# Patient Record
Sex: Female | Born: 1965 | Race: White | Hispanic: No | Marital: Married | Smoking: Never smoker
Health system: Southern US, Community
[De-identification: ages and names within clinical notes are randomized; demographics above are authoritative.]

## PROBLEM LIST (undated history)

## (undated) DIAGNOSIS — M1990 Unspecified osteoarthritis, unspecified site: Secondary | ICD-10-CM

## (undated) DIAGNOSIS — K219 Gastro-esophageal reflux disease without esophagitis: Secondary | ICD-10-CM

## (undated) DIAGNOSIS — Z87442 Personal history of urinary calculi: Secondary | ICD-10-CM

## (undated) DIAGNOSIS — J189 Pneumonia, unspecified organism: Secondary | ICD-10-CM

## (undated) DIAGNOSIS — C801 Malignant (primary) neoplasm, unspecified: Secondary | ICD-10-CM

## (undated) DIAGNOSIS — E119 Type 2 diabetes mellitus without complications: Secondary | ICD-10-CM

## (undated) DIAGNOSIS — N809 Endometriosis, unspecified: Secondary | ICD-10-CM

## (undated) DIAGNOSIS — T884XXA Failed or difficult intubation, initial encounter: Secondary | ICD-10-CM

## (undated) DIAGNOSIS — G2581 Restless legs syndrome: Secondary | ICD-10-CM

## (undated) DIAGNOSIS — N189 Chronic kidney disease, unspecified: Secondary | ICD-10-CM

## (undated) HISTORY — PX: ABDOMINAL HYSTERECTOMY: SHX81

## (undated) HISTORY — PX: MULTIPLE TOOTH EXTRACTIONS: SHX2053

## (undated) HISTORY — PX: LAPAROSCOPY WITH TUBAL LIGATION: SHX5576

## (undated) HISTORY — PX: COLONOSCOPY: SHX174

## (undated) HISTORY — PX: LAPAROSCOPIC TOTAL HYSTERECTOMY: SUR800

## (undated) HISTORY — PX: TONSILLECTOMY: SUR1361

## (undated) HISTORY — PX: CHOLECYSTECTOMY: SHX55

---
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
## 1993-01-01 HISTORY — PX: LEEP: SHX91

## 2001-01-01 HISTORY — PX: CARPAL TUNNEL RELEASE: SHX101

## 2003-11-29 ENCOUNTER — Ambulatory Visit: Payer: Self-pay | Admitting: Unknown Physician Specialty

## 2003-12-03 ENCOUNTER — Ambulatory Visit: Payer: Self-pay | Admitting: Unknown Physician Specialty

## 2005-01-01 DIAGNOSIS — G2581 Restless legs syndrome: Secondary | ICD-10-CM

## 2005-01-01 HISTORY — DX: Restless legs syndrome: G25.81

## 2005-02-08 ENCOUNTER — Ambulatory Visit: Payer: Self-pay | Admitting: Unknown Physician Specialty

## 2005-10-17 ENCOUNTER — Ambulatory Visit: Payer: Self-pay | Admitting: Unknown Physician Specialty

## 2005-10-26 ENCOUNTER — Ambulatory Visit: Payer: Self-pay | Admitting: Unknown Physician Specialty

## 2005-12-06 ENCOUNTER — Ambulatory Visit: Payer: Self-pay | Admitting: Unknown Physician Specialty

## 2006-02-11 ENCOUNTER — Ambulatory Visit: Payer: Self-pay | Admitting: Unknown Physician Specialty

## 2007-02-18 ENCOUNTER — Ambulatory Visit: Payer: Self-pay | Admitting: Unknown Physician Specialty

## 2008-03-11 ENCOUNTER — Ambulatory Visit: Payer: Self-pay | Admitting: Unknown Physician Specialty

## 2008-12-06 ENCOUNTER — Ambulatory Visit: Payer: Self-pay | Admitting: Internal Medicine

## 2009-03-16 ENCOUNTER — Ambulatory Visit: Payer: Self-pay | Admitting: Unknown Physician Specialty

## 2009-08-23 ENCOUNTER — Ambulatory Visit: Payer: Self-pay | Admitting: Urology

## 2010-03-30 ENCOUNTER — Ambulatory Visit: Payer: Self-pay | Admitting: Unknown Physician Specialty

## 2011-01-24 ENCOUNTER — Ambulatory Visit: Payer: Self-pay | Admitting: Internal Medicine

## 2011-02-01 ENCOUNTER — Ambulatory Visit: Payer: Self-pay | Admitting: Urology

## 2011-02-06 ENCOUNTER — Ambulatory Visit: Payer: Self-pay | Admitting: Urology

## 2011-04-09 ENCOUNTER — Ambulatory Visit: Payer: Self-pay | Admitting: Obstetrics and Gynecology

## 2011-10-02 ENCOUNTER — Ambulatory Visit: Payer: Self-pay | Admitting: Urology

## 2012-04-21 ENCOUNTER — Ambulatory Visit: Payer: Self-pay | Admitting: Obstetrics and Gynecology

## 2012-04-22 ENCOUNTER — Ambulatory Visit: Payer: Self-pay | Admitting: Obstetrics and Gynecology

## 2012-05-18 ENCOUNTER — Emergency Department: Payer: Self-pay | Admitting: Emergency Medicine

## 2012-05-18 LAB — BASIC METABOLIC PANEL
Anion Gap: 8 (ref 7–16)
BUN: 14 mg/dL (ref 7–18)
Calcium, Total: 8.7 mg/dL (ref 8.5–10.1)
Chloride: 105 mmol/L (ref 98–107)
Co2: 25 mmol/L (ref 21–32)
Creatinine: 0.78 mg/dL (ref 0.60–1.30)
EGFR (African American): >60
EGFR (Non-African Amer.): >60
Glucose: 118 mg/dL — ABNORMAL HIGH (ref 65–99)
Osmolality: 277 (ref 275–301)
Sodium: 138 mmol/L (ref 136–145)

## 2012-05-18 LAB — URINALYSIS, COMPLETE
Glucose,UR: NEGATIVE mg/dL (ref 0–75)
Ketone: NEGATIVE
Ph: 5 (ref 4.5–8.0)
Protein: NEGATIVE
RBC,UR: 96 /HPF (ref 0–5)
Specific Gravity: 1.017 (ref 1.003–1.030)
Squamous Epithelial: 7

## 2012-05-18 LAB — CBC
HCT: 37.2 % (ref 35.0–47.0)
HGB: 12.9 g/dL (ref 12.0–16.0)
MCHC: 34.8 g/dL (ref 32.0–36.0)
MCV: 86 fL (ref 80–100)
RBC: 4.31 10*6/uL (ref 3.80–5.20)
RDW: 12.4 % (ref 11.5–14.5)

## 2012-05-21 ENCOUNTER — Ambulatory Visit: Payer: Self-pay | Admitting: Urology

## 2012-05-30 ENCOUNTER — Ambulatory Visit: Payer: Self-pay

## 2012-06-30 ENCOUNTER — Ambulatory Visit: Payer: Self-pay | Admitting: Urology

## 2012-07-07 ENCOUNTER — Ambulatory Visit: Payer: Self-pay | Admitting: Urology

## 2013-03-25 ENCOUNTER — Ambulatory Visit: Payer: Self-pay | Admitting: Urology

## 2013-06-04 ENCOUNTER — Ambulatory Visit: Payer: Self-pay | Admitting: Internal Medicine

## 2013-09-03 ENCOUNTER — Emergency Department: Payer: Self-pay | Admitting: Emergency Medicine

## 2013-09-03 LAB — CBC WITH DIFFERENTIAL/PLATELET
BASOS ABS: 0.1 10*3/uL (ref 0.0–0.1)
Basophil %: 1.2 %
EOS ABS: 0 10*3/uL (ref 0.0–0.7)
Eosinophil %: 0.2 %
HCT: 38 % (ref 35.0–47.0)
HGB: 12.6 g/dL (ref 12.0–16.0)
Lymphocyte #: 2.9 10*3/uL (ref 1.0–3.6)
Lymphocyte %: 29.5 %
MCH: 29.2 pg (ref 26.0–34.0)
MCHC: 33.2 g/dL (ref 32.0–36.0)
MCV: 88 fL (ref 80–100)
MONOS PCT: 7.3 %
Monocyte #: 0.7 x10 3/mm (ref 0.2–0.9)
NEUTROS ABS: 6.1 10*3/uL (ref 1.4–6.5)
Neutrophil %: 61.8 %
PLATELETS: 261 10*3/uL (ref 150–440)
RBC: 4.32 10*6/uL (ref 3.80–5.20)
RDW: 12.6 % (ref 11.5–14.5)
WBC: 9.9 10*3/uL (ref 3.6–11.0)

## 2013-09-03 LAB — COMPREHENSIVE METABOLIC PANEL
ALK PHOS: 92 U/L
ANION GAP: 8 (ref 7–16)
Albumin: 4.1 g/dL (ref 3.4–5.0)
BILIRUBIN TOTAL: 0.4 mg/dL (ref 0.2–1.0)
BUN: 13 mg/dL (ref 7–18)
CREATININE: 0.99 mg/dL (ref 0.60–1.30)
Calcium, Total: 9 mg/dL (ref 8.5–10.1)
Chloride: 103 mmol/L (ref 98–107)
Co2: 26 mmol/L (ref 21–32)
EGFR (Non-African Amer.): >60
Glucose: 97 mg/dL (ref 65–99)
Osmolality: 274 (ref 275–301)
POTASSIUM: 3.7 mmol/L (ref 3.5–5.1)
SGOT(AST): 25 U/L (ref 15–37)
SGPT (ALT): 35 U/L
Sodium: 137 mmol/L (ref 136–145)
TOTAL PROTEIN: 7.3 g/dL (ref 6.4–8.2)

## 2013-09-03 LAB — URINALYSIS, COMPLETE
BACTERIA: NONE SEEN
Bilirubin,UR: NEGATIVE
GLUCOSE, UR: NEGATIVE mg/dL (ref 0–75)
KETONE: NEGATIVE
Leukocyte Esterase: NEGATIVE
Nitrite: NEGATIVE
PROTEIN: NEGATIVE
Ph: 5 (ref 4.5–8.0)
RBC,UR: 27 /HPF (ref 0–5)
SPECIFIC GRAVITY: 1.026 (ref 1.003–1.030)
Squamous Epithelial: 4
WBC UR: NONE SEEN /HPF (ref 0–5)

## 2014-04-23 NOTE — Op Note (Signed)
PATIENT NAME:  Martha Whitney, Martha Whitney MR#:  [id number] DATE OF BIRTH:  Feb 15, 1965  PREOPERATIVE DIAGNOSIS: Right filling defect in the right kidney.   POSTOPERATIVE DIAGNOSIS: Right filling defect in the right kidney with calculus seen in the upper pole.   DESCRIPTION OF PROCEDURE: The patient was sterilely prepped and draped in the supine lithotomy position for ease of approach to the external genitalia. I placed a scope in the bladder. I placed 2 wires up right ureter into the renal pelvis. Then, with a digital flexible ureteroscope, after a Navigator has been placed, I go up the ureter into the kidney. Kidney reveals no tumors, masses or growths. There is some bleeding from an upper pole calyx, but I see no tumor behind the bleeding. It may be just due to irritation for the wires. The Glidewires are not in the way and I am able to view all of the calyces. Digital camera helps this.  No tumors, masses or growths seen. One calculus is seen. It is a very calculus on a papilla. It is knocked loose of the papilla with the flexible scope. It should pass easily. The washings were taken with barbotage utilizing 10 mL syringe of normal saline. Washings were sent to pathology. At the end of the procedure, the kidney had been emptied, The Navigator is withdrawn and the wire was withdrawn. Bladder was emptied and 30 mL of 0.5% Marcaine is placed in the bladder. B and O suppository is placed. The patient is send to recovery in satisfactory condition.    ____________________________ Janice Coffin. Elnoria Howard, DO rdh:gb D: 07/07/2012 15:58:59 ET T: 07/07/2012 23:58:53 ET JOB#: [id number]  cc: Janice Coffin. Elnoria Howard, DO, <Dictator> RICHARD D HART DO ELECTRONICALLY SIGNED 07/25/2012 16:35

## 2014-05-12 ENCOUNTER — Emergency Department
Admission: EM | Admit: 2014-05-12 | Discharge: 2014-05-12 | Disposition: A | Discharge status: Home / Self Care | Payer: BC Managed Care – PPO | Attending: Emergency Medicine | Admitting: Emergency Medicine

## 2014-05-12 ENCOUNTER — Emergency Department: Payer: BC Managed Care – PPO

## 2014-05-12 DIAGNOSIS — N23 Unspecified renal colic: Secondary | ICD-10-CM | POA: Insufficient documentation

## 2014-05-12 DIAGNOSIS — R109 Unspecified abdominal pain: Secondary | ICD-10-CM | POA: Diagnosis present

## 2014-05-12 LAB — CBC WITH DIFFERENTIAL/PLATELET
Basophils Absolute: 0 10*3/uL (ref 0–0.1)
Basophils Relative: 0 %
EOS ABS: 0 10*3/uL (ref 0–0.7)
EOS PCT: 0 %
HCT: 41.5 % (ref 35.0–47.0)
Hemoglobin: 13.9 g/dL (ref 12.0–16.0)
Lymphocytes Relative: 26 %
Lymphs Abs: 2.6 10*3/uL (ref 1.0–3.6)
MCH: 29.5 pg (ref 26.0–34.0)
MCHC: 33.6 g/dL (ref 32.0–36.0)
MCV: 87.6 fL (ref 80.0–100.0)
MONOS PCT: 6 %
Monocytes Absolute: 0.6 10*3/uL (ref 0.2–0.9)
NEUTROS ABS: 6.8 10*3/uL — AB (ref 1.4–6.5)
NEUTROS PCT: 68 %
PLATELETS: 244 10*3/uL (ref 150–440)
RBC: 4.73 MIL/uL (ref 3.80–5.20)
RDW: 12.4 % (ref 11.5–14.5)
WBC: 10 10*3/uL (ref 3.6–11.0)

## 2014-05-12 LAB — BASIC METABOLIC PANEL
Anion gap: 7 (ref 5–15)
BUN: 14 mg/dL (ref 6–20)
CALCIUM: 8.8 mg/dL — AB (ref 8.9–10.3)
CHLORIDE: 101 mmol/L (ref 101–111)
CO2: 28 mmol/L (ref 22–32)
CREATININE: 0.77 mg/dL (ref 0.44–1.00)
GFR calc Af Amer: >60 mL/min (ref >60)
GFR calc non Af Amer: >60 mL/min (ref >60)
GLUCOSE: 108 mg/dL — AB (ref 65–99)
Potassium: 4 mmol/L (ref 3.5–5.1)
Sodium: 136 mmol/L (ref 135–145)

## 2014-05-12 LAB — URINALYSIS COMPLETE WITH MICROSCOPIC (ARMC ONLY)
Bacteria, UA: NONE SEEN
Bilirubin Urine: NEGATIVE
GLUCOSE, UA: NEGATIVE mg/dL
Ketones, ur: NEGATIVE mg/dL
LEUKOCYTES UA: NEGATIVE
NITRITE: NEGATIVE
Protein, ur: NEGATIVE mg/dL
SPECIFIC GRAVITY, URINE: 1.017 (ref 1.005–1.030)
pH: 5 (ref 5.0–8.0)

## 2014-05-12 MED ORDER — ONDANSETRON HCL 4 MG PO TABS: 4.0000 mg | ORAL_TABLET | Freq: Four times a day (QID) | ORAL | Status: DC | PRN

## 2014-05-12 MED ORDER — OXYCODONE-ACETAMINOPHEN 5-325 MG PO TABS: 1.0000 | ORAL_TABLET | Freq: Four times a day (QID) | ORAL | Status: DC | PRN

## 2014-05-12 MED ORDER — ONDANSETRON HCL 4 MG/2ML IJ SOLN
4.0000 mg | Freq: Once | INTRAMUSCULAR | Status: AC
  Administered 2014-05-12: 4 mg via INTRAVENOUS

## 2014-05-12 MED ORDER — KETOROLAC TROMETHAMINE 30 MG/ML IJ SOLN
30.0000 mg | Freq: Once | INTRAMUSCULAR | Status: AC
  Administered 2014-05-12: 30 mg via INTRAVENOUS

## 2014-05-12 MED ORDER — SODIUM CHLORIDE 0.9 % IV BOLUS (SEPSIS)
1000.0000 mL | Freq: Once | INTRAVENOUS | Status: AC
  Administered 2014-05-12: 1000 mL via INTRAVENOUS

## 2014-05-12 MED ORDER — KETOROLAC TROMETHAMINE 30 MG/ML IJ SOLN
INTRAMUSCULAR | Status: AC
  Administered 2014-05-12: 30 mg via INTRAVENOUS
  Filled 2014-05-12: qty 1

## 2014-05-12 MED ORDER — ONDANSETRON HCL 4 MG/2ML IJ SOLN
INTRAMUSCULAR | Status: AC
  Administered 2014-05-12: 4 mg via INTRAVENOUS
  Filled 2014-05-12: qty 2

## 2014-05-12 NOTE — ED Notes (Signed)
Right flank pain hx of kidney stones

## 2014-05-12 NOTE — Discharge Instructions (Signed)
Kidney Stones °Kidney stones (urolithiasis) are deposits that form inside your kidneys. The intense pain is caused by the stone moving through the urinary tract. When the stone moves, the ureter goes into spasm around the stone. The stone is usually passed in the urine.  °CAUSES  °· A disorder that makes certain neck glands produce too much parathyroid hormone (primary hyperparathyroidism). °· A buildup of uric acid crystals, similar to gout in your joints. °· Narrowing (stricture) of the ureter. °· A kidney obstruction present at birth (congenital obstruction). °· Previous surgery on the kidney or ureters. °· Numerous kidney infections. °SYMPTOMS  °· Feeling sick to your stomach (nauseous). °· Throwing up (vomiting). °· Blood in the urine (hematuria). °· Pain that usually spreads (radiates) to the groin. °· Frequency or urgency of urination. °DIAGNOSIS  °· Taking a history and physical exam. °· Blood or urine tests. °· CT scan. °· Occasionally, an examination of the inside of the urinary bladder (cystoscopy) is performed. °TREATMENT  °· Observation. °· Increasing your fluid intake. °· Extracorporeal shock wave lithotripsy--This is a noninvasive procedure that uses shock waves to break up kidney stones. °· Surgery may be needed if you have severe pain or persistent obstruction. There are various surgical procedures. Most of the procedures are performed with the use of small instruments. Only small incisions are needed to accommodate these instruments, so recovery time is minimized. °The size, location, and chemical composition are all important variables that will determine the proper choice of action for you. Talk to your health care provider to better understand your situation so that you will minimize the risk of injury to yourself and your kidney.  °HOME CARE INSTRUCTIONS  °· Drink enough water and fluids to keep your urine clear or pale yellow. This will help you to pass the stone or stone fragments. °· Strain  all urine through the provided strainer. Keep all particulate matter and stones for your health care provider to see. The stone causing the pain may be as small as a grain of salt. It is very important to use the strainer each and every time you pass your urine. The collection of your stone will allow your health care provider to analyze it and verify that a stone has actually passed. The stone analysis will often identify what you can do to reduce the incidence of recurrences. °· Only take over-the-counter or prescription medicines for pain, discomfort, or fever as directed by your health care provider. °· Make a follow-up appointment with your health care provider as directed. °· Get follow-up X-rays if required. The absence of pain does not always mean that the stone has passed. It may have only stopped moving. If the urine remains completely obstructed, it can cause loss of kidney function or even complete destruction of the kidney. It is your responsibility to make sure X-rays and follow-ups are completed. Ultrasounds of the kidney can show blockages and the status of the kidney. Ultrasounds are not associated with any radiation and can be performed easily in a matter of minutes. °SEEK MEDICAL CARE IF: °· You experience pain that is progressive and unresponsive to any pain medicine you have been prescribed. °SEEK IMMEDIATE MEDICAL CARE IF:  °· Pain cannot be controlled with the prescribed medicine. °· You have a fever or shaking chills. °· The severity or intensity of pain increases over 18 hours and is not relieved by pain medicine. °· You develop a new onset of abdominal pain. °· You feel faint or pass out. °·   You are unable to urinate. MAKE SURE YOU:   Understand these instructions.  Will watch your condition.  Will get help right away if you are not doing well or get worse. Document Released: 12/18/2004 Document Revised: 08/20/2012 Document Reviewed: 05/21/2012 [facility] Patient Information 2015  [place], [place]. This information is not intended to replace advice given to you by your health care provider. Make sure you discuss any questions you have with your health care provider.  Take Percocet as prescribed. Do not drink alcohol, drive or participate in any other potentially dangerous activities while taking this medication as it may make you sleepy. Do not take this medication with any other sedating medications, either prescription or over-the-counter. If you were prescribed Percocet or Vicodin, do not take these with acetaminophen (Tylenol) as it is already contained within these medications.   This medication is an opiate (or narcotic) pain medication and can be habit forming.  Use it as little as possible to achieve adequate pain control.  Do not use or use it with extreme caution if you have a history of opiate abuse or dependence.  If you are on a pain contract with your primary care doctor or a pain specialist, be sure to let them know you were prescribed this medication today from the [facility].  This medication is intended for your use only - do not give any to anyone else and keep it in a secure place where nobody else, especially children, have access to it.  It will also cause or worsen constipation, so you may want to consider taking an over-the-counter stool softener while you are taking this medication.

## 2014-05-12 NOTE — ED Notes (Signed)
NAD noted at time of D/C. Pt denies questions or concerns. Pt ambulatory to the lobby at this time.  

## 2014-05-12 NOTE — ED Provider Notes (Signed)
[facility] Emergency Department Provider Note  ____________________________________________  Time seen: 7:45 AM  I have reviewed the triage vital signs and the nursing notes.   HISTORY  Chief Complaint Flank Pain    HPI Martha Whitney is a 49 y.o. female who complains of acute right flank pain, which is sharp right flank radiating to right lower quadrant and right groin, colicky with intermittent episodes lasting several minutes of severe pain. Associated with nausea and vomiting. No aggravating or alleviating factors. No fever, chills, chest pain, shortness of breath, syncope.     No past medical history on file.  There are no active problems to display for this patient.   No past surgical history on file.  Current Outpatient Rx  Name  Route  Sig  Dispense  Refill  . ondansetron (ZOFRAN) 4 MG tablet   Oral   Take 1 tablet (4 mg total) by mouth every 6 (six) hours as needed for nausea or vomiting.   20 tablet   1   . oxyCODONE-acetaminophen (ROXICET) 5-325 MG per tablet   Oral   Take 1 tablet by mouth every 6 (six) hours as needed for severe pain.   12 tablet   0     Allergies Sulfa antibiotics  No family history on file.  Social History History  Substance Use Topics  . Smoking status: Not on file  . Smokeless tobacco: Not on file  . Alcohol Use: Not on file    Review of Systems  Constitutional: No fever or chills. No weight changes Eyes:No blurry vision or double vision.  ENT: No sore throat. Cardiovascular: No chest pain. Respiratory: No dyspnea or cough. Gastrointestinal: As per history of present illness.  No BRBPR or melena. Genitourinary: Negative for dysuria, urinary retention,or difficulty urinating. He noted red-looking urine yesterday Musculoskeletal: Negative for back pain. No joint swelling or pain. Skin: Negative for rash. Neurological: Negative for headaches, focal weakness or numbness. Psychiatric:No  anxiety or depression.   Endocrine:No hot/cold intolerance, changes in energy, or sleep difficulty.  10-point ROS otherwise negative.  ____________________________________________   PHYSICAL EXAM:  VITAL SIGNS: ED Triage Vitals  Enc Vitals Group     BP 05/12/14 0701 120/69 mmHg     Pulse Rate 05/12/14 0701 86     Resp 05/12/14 0701 18     Temp 05/12/14 0701 97.8 F (36.6 C)     Temp Source 05/12/14 0701 Oral     SpO2 05/12/14 0701 100 %     Weight 05/12/14 0701 143 lb (64.864 kg)     Height 05/12/14 0701 5\' 3"  (1.6 m)     Head Cir --      Peak Flow --      Pain Score 05/12/14 0702 5     Pain Loc --      Pain Edu? --      Excl. in [place]? --      Constitutional: Alert and oriented. Well appearing and in no distress. Eyes: No scleral icterus. No conjunctival pallor. PERRL. EOMI ENT   Head: Normocephalic and atraumatic.   Nose: No congestion/rhinnorhea. No septal hematoma   Mouth/Throat: MMM, no pharyngeal erythema   Neck: No stridor. No SubQ emphysema.  Hematological/Lymphatic/Immunilogical: No cervical lymphadenopathy. Cardiovascular: RRR. Normal and symmetric distal pulses are present in all extremities. No murmurs, rubs, or gallops. Respiratory: Normal respiratory effort without tachypnea nor retractions. Breath sounds are clear and equal bilaterally. No wheezes/rales/rhonchi. Gastrointestinal: Soft and nontender. No distention. There is no  CVA tenderness.  No rebound, rigidity, or guarding. Genitourinary: deferred Musculoskeletal: Nontender with normal range of motion in all extremities. No joint effusions.  No lower extremity tenderness.  No edema. Neurologic:   Normal speech and language.  CN 2-10 normal. Motor grossly intact. No pronator drift.  Normal gait. No gross focal neurologic deficits are appreciated.  Skin:  Skin is warm, dry and intact. No rash noted.  No petechiae, purpura, or bullae. Psychiatric: Mood and affect are normal. Speech and  behavior are normal. Patient exhibits appropriate insight and judgment.  ____________________________________________    LABS (pertinent positives/negatives) (all labs ordered are listed, but only abnormal results are displayed) Labs Reviewed  CBC WITH DIFFERENTIAL/PLATELET - Abnormal; Notable for the following:    Neutro Abs 6.8 (*)    All other components within normal limits  BASIC METABOLIC PANEL - Abnormal; Notable for the following:    Glucose, Bld 108 (*)    Calcium 8.8 (*)    All other components within normal limits  URINALYSIS COMPLETEWITH MICROSCOPIC ([facility])  - Abnormal; Notable for the following:    Color, Urine YELLOW (*)    APPearance HAZY (*)    Hgb urine dipstick 1+ (*)    Squamous Epithelial / LPF 0-5 (*)    All other components within normal limits   ____________________________________________   EKG    ____________________________________________    RADIOLOGY  CT abdomen and pelvis unremarkable except for mild right hydronephrosis area chronic right ovarian cyst  ____________________________________________   PROCEDURES  ____________________________________________   INITIAL IMPRESSION / ASSESSMENT AND PLAN / ED COURSE  Pertinent labs & imaging results that were available during my care of the patient were reviewed by me and considered in my medical decision making (see chart for details).  Patient presents with likely renal colic. We'll give her Toradol and Zofran and IV fluids. Check labs and CT.  ----------------------------------------- 10:17 AM on 05/12/2014 -----------------------------------------  Workup unremarkable, consistent with kidney stone that has recently passed. The patient's pain is resolved and likely will not recur. She'll follow-up with her urologist for further evaluation if her symptoms recur. Medically stable good condition. Doubt appendicitis, torsion, or other surgical  abdomen.  ____________________________________________   FINAL CLINICAL IMPRESSION(S) / ED DIAGNOSES  Final diagnoses:  Renal colic on right side      Carrie Mew, MD 05/12/14 1018

## 2014-06-14 ENCOUNTER — Encounter
Admission: RE | Admit: 2014-06-14 | Discharge: 2014-06-14 | Disposition: A | Discharge status: Home / Self Care | Payer: BC Managed Care – PPO | Source: Ambulatory Visit | Attending: Obstetrics and Gynecology | Admitting: Obstetrics and Gynecology

## 2014-06-14 DIAGNOSIS — Z01812 Encounter for preprocedural laboratory examination: Secondary | ICD-10-CM | POA: Insufficient documentation

## 2014-06-14 DIAGNOSIS — N8320 Unspecified ovarian cysts: Secondary | ICD-10-CM | POA: Diagnosis not present

## 2014-06-14 HISTORY — DX: Restless legs syndrome: G25.81

## 2014-06-14 LAB — ABO/RH: ABO/RH(D): A POS

## 2014-06-14 LAB — TYPE AND SCREEN
ABO/RH(D): A POS
Antibody Screen: NEGATIVE

## 2014-06-14 NOTE — Patient Instructions (Signed)
  Your procedure is scheduled on: Tuesday June 22, 2014 Report to Same Day Surgery. To find out your arrival time please call [phone] between 1PM - 3PM on Monday June 21, 2014.  Remember: Instructions that are not followed completely may result in serious medical risk, up to and including death, or upon the discretion of your surgeon and anesthesiologist your surgery may need to be rescheduled.    __x__ 1. Do not eat food or drink liquids after midnight. No gum chewing or hard candies.     __x__ 2. No Alcohol for 24 hours before or after surgery.   ____ 3. Bring all medications with you on the day of surgery if instructed.    _x_ 4. Notify your doctor if there is any change in your medical condition     (cold, fever, infections).     Do not wear jewelry, make-up, hairpins, clips or nail polish.  Do not wear lotions, powders, or perfumes. You may wear deodorant.  Do not shave 48 hours prior to surgery. Men may shave face and neck.  Do not bring valuables to the hospital.    [facility] is not responsible for any belongings or valuables.               Contacts, dentures or bridgework may not be worn into surgery.  Leave your suitcase in the car. After surgery it may be brought to your room.  For patients admitted to the hospital, discharge time is determined by your treatment team.   Patients discharged the day of surgery will not be allowed to drive home.    Please read over the following fact sheets that you were given:   [facility]  ____ Take these medicines the morning of surgery with A SIP OF WATER: None     ____ Fleet Enema (as directed)   __x__ Use CHG Soap as directed  ____ Use inhalers on the day of surgery  ____ Stop metformin 2 days prior to surgery    ____ Take 1/2 of usual insulin dose the night before surgery and none on the morning of surgery.   ____ Stop Coumadin/Plavix/aspirin on does not apply  _x__ Stop Anti-inflammatories  today, may take Tylenol if needed for pain.  ____ Stop supplements until after surgery.    ____ Bring C-Pap to the hospital.

## 2014-06-17 ENCOUNTER — Inpatient Hospital Stay: Admission: RE | Admit: 2014-06-17 | Payer: BC Managed Care – PPO | Source: Ambulatory Visit

## 2014-06-22 ENCOUNTER — Ambulatory Visit: Payer: BC Managed Care – PPO | Admitting: Anesthesiology

## 2014-06-22 ENCOUNTER — Encounter: Payer: Self-pay | Admitting: *Deleted

## 2014-06-22 ENCOUNTER — Ambulatory Visit
Admission: RE | Admit: 2014-06-22 | Discharge: 2014-06-22 | Disposition: A | Discharge status: Home / Self Care | Payer: BC Managed Care – PPO | Source: Ambulatory Visit | Attending: Obstetrics and Gynecology | Admitting: Obstetrics and Gynecology

## 2014-06-22 ENCOUNTER — Encounter
Admission: RE | Disposition: A | Discharge status: Home / Self Care | Payer: Self-pay | Source: Ambulatory Visit | Attending: Obstetrics and Gynecology

## 2014-06-22 DIAGNOSIS — K219 Gastro-esophageal reflux disease without esophagitis: Secondary | ICD-10-CM | POA: Diagnosis not present

## 2014-06-22 DIAGNOSIS — Z9889 Other specified postprocedural states: Secondary | ICD-10-CM | POA: Diagnosis not present

## 2014-06-22 DIAGNOSIS — N830 Follicular cyst of ovary: Secondary | ICD-10-CM | POA: Diagnosis not present

## 2014-06-22 DIAGNOSIS — N8329 Other ovarian cysts: Secondary | ICD-10-CM | POA: Diagnosis not present

## 2014-06-22 DIAGNOSIS — N839 Noninflammatory disorder of ovary, fallopian tube and broad ligament, unspecified: Secondary | ICD-10-CM | POA: Insufficient documentation

## 2014-06-22 DIAGNOSIS — Z8249 Family history of ischemic heart disease and other diseases of the circulatory system: Secondary | ICD-10-CM | POA: Insufficient documentation

## 2014-06-22 DIAGNOSIS — Z833 Family history of diabetes mellitus: Secondary | ICD-10-CM | POA: Insufficient documentation

## 2014-06-22 DIAGNOSIS — Z9049 Acquired absence of other specified parts of digestive tract: Secondary | ICD-10-CM | POA: Diagnosis not present

## 2014-06-22 DIAGNOSIS — Z90710 Acquired absence of both cervix and uterus: Secondary | ICD-10-CM | POA: Insufficient documentation

## 2014-06-22 DIAGNOSIS — N8320 Unspecified ovarian cysts: Secondary | ICD-10-CM | POA: Diagnosis present

## 2014-06-22 DIAGNOSIS — Z8742 Personal history of other diseases of the female genital tract: Secondary | ICD-10-CM | POA: Diagnosis not present

## 2014-06-22 DIAGNOSIS — Z87442 Personal history of urinary calculi: Secondary | ICD-10-CM | POA: Diagnosis not present

## 2014-06-22 DIAGNOSIS — Z79899 Other long term (current) drug therapy: Secondary | ICD-10-CM | POA: Insufficient documentation

## 2014-06-22 DIAGNOSIS — Z882 Allergy status to sulfonamides status: Secondary | ICD-10-CM | POA: Insufficient documentation

## 2014-06-22 HISTORY — PX: LAPAROSCOPIC SALPINGO OOPHERECTOMY: SHX5927

## 2014-06-22 HISTORY — DX: Endometriosis, unspecified: N80.9

## 2014-06-22 SURGERY — SALPINGO-OOPHORECTOMY, LAPAROSCOPIC
Anesthesia: General | Laterality: Bilateral | Wound class: Clean Contaminated

## 2014-06-22 MED ORDER — DOCUSATE SODIUM 100 MG PO CAPS: 100.0000 mg | ORAL_CAPSULE | Freq: Two times a day (BID) | ORAL | Status: DC | PRN

## 2014-06-22 MED ORDER — FENTANYL CITRATE (PF) 100 MCG/2ML IJ SOLN
INTRAMUSCULAR | Status: DC | PRN
  Administered 2014-06-22 (×4): 50 ug via INTRAVENOUS

## 2014-06-22 MED ORDER — BUPIVACAINE HCL (PF) 0.5 % IJ SOLN
INTRAMUSCULAR | Status: AC
  Filled 2014-06-22: qty 30

## 2014-06-22 MED ORDER — GLYCOPYRROLATE 0.2 MG/ML IJ SOLN
INTRAMUSCULAR | Status: DC | PRN
  Administered 2014-06-22: 0.6 mg via INTRAVENOUS

## 2014-06-22 MED ORDER — FENTANYL CITRATE (PF) 100 MCG/2ML IJ SOLN
25.0000 ug | INTRAMUSCULAR | Status: DC | PRN
  Administered 2014-06-22 (×4): 25 ug via INTRAVENOUS

## 2014-06-22 MED ORDER — LACTATED RINGERS IV SOLN
INTRAVENOUS | Status: DC | PRN
  Administered 2014-06-22: 09:00:00 via INTRAVENOUS

## 2014-06-22 MED ORDER — LACTATED RINGERS IV SOLN: INTRAVENOUS | Status: DC

## 2014-06-22 MED ORDER — ONDANSETRON HCL 4 MG PO TABS: 4.0000 mg | ORAL_TABLET | Freq: Four times a day (QID) | ORAL | Status: DC | PRN

## 2014-06-22 MED ORDER — LIDOCAINE HCL (CARDIAC) 20 MG/ML IV SOLN
INTRAVENOUS | Status: DC | PRN
  Administered 2014-06-22: 80 mg via INTRAVENOUS

## 2014-06-22 MED ORDER — PROPOFOL 10 MG/ML IV BOLUS
INTRAVENOUS | Status: DC | PRN
Start: 2014-06-22 — End: 2014-06-22
  Administered 2014-06-22: 150 mg via INTRAVENOUS

## 2014-06-22 MED ORDER — ONDANSETRON HCL 4 MG/2ML IJ SOLN
INTRAMUSCULAR | Status: DC | PRN
  Administered 2014-06-22: 4 mg via INTRAVENOUS

## 2014-06-22 MED ORDER — OXYCODONE-ACETAMINOPHEN 5-325 MG PO TABS
ORAL_TABLET | ORAL | Status: AC
  Filled 2014-06-22: qty 1

## 2014-06-22 MED ORDER — MIDAZOLAM HCL 2 MG/2ML IJ SOLN
INTRAMUSCULAR | Status: DC | PRN
  Administered 2014-06-22: 2 mg via INTRAVENOUS

## 2014-06-22 MED ORDER — LACTATED RINGERS IV SOLN
INTRAVENOUS | Status: DC
  Administered 2014-06-22: 07:00:00 via INTRAVENOUS

## 2014-06-22 MED ORDER — OXYCODONE-ACETAMINOPHEN 5-325 MG PO TABS
1.0000 | ORAL_TABLET | Freq: Four times a day (QID) | ORAL | Status: DC | PRN
  Administered 2014-06-22: 1 via ORAL

## 2014-06-22 MED ORDER — SIMETHICONE 80 MG PO CHEW: 80.0000 mg | CHEWABLE_TABLET | Freq: Four times a day (QID) | ORAL | Status: DC | PRN

## 2014-06-22 MED ORDER — ONDANSETRON HCL 4 MG/2ML IJ SOLN: 4.0000 mg | Freq: Once | INTRAMUSCULAR | Status: DC | PRN

## 2014-06-22 MED ORDER — ONDANSETRON HCL 4 MG/2ML IJ SOLN: 4.0000 mg | Freq: Four times a day (QID) | INTRAMUSCULAR | Status: DC | PRN

## 2014-06-22 MED ORDER — DEXAMETHASONE SODIUM PHOSPHATE 4 MG/ML IJ SOLN
INTRAMUSCULAR | Status: DC | PRN
  Administered 2014-06-22: 10 mg via INTRAVENOUS

## 2014-06-22 MED ORDER — OXYCODONE-ACETAMINOPHEN 5-325 MG PO TABS: 1.0000 | ORAL_TABLET | ORAL | Status: DC | PRN

## 2014-06-22 MED ORDER — HYDROMORPHONE HCL 1 MG/ML IJ SOLN: 0.2000 mg | INTRAMUSCULAR | Status: DC | PRN

## 2014-06-22 MED ORDER — IBUPROFEN 200 MG PO TABS: 200.0000 mg | ORAL_TABLET | Freq: Four times a day (QID) | ORAL | Status: DC | PRN

## 2014-06-22 MED ORDER — ALUM & MAG HYDROXIDE-SIMETH 200-200-20 MG/5ML PO SUSP: 30.0000 mL | ORAL | Status: DC | PRN

## 2014-06-22 MED ORDER — BUPIVACAINE HCL 0.5 % IJ SOLN
INTRAMUSCULAR | Status: DC | PRN
  Administered 2014-06-22: 30 mL

## 2014-06-22 MED ORDER — FENTANYL CITRATE (PF) 100 MCG/2ML IJ SOLN
INTRAMUSCULAR | Status: AC
  Filled 2014-06-22: qty 2

## 2014-06-22 MED ORDER — ROCURONIUM BROMIDE 100 MG/10ML IV SOLN
INTRAVENOUS | Status: DC | PRN
  Administered 2014-06-22: 10 mg via INTRAVENOUS
  Administered 2014-06-22: 40 mg via INTRAVENOUS

## 2014-06-22 MED ORDER — NEOSTIGMINE METHYLSULFATE 10 MG/10ML IV SOLN
INTRAVENOUS | Status: DC | PRN
  Administered 2014-06-22: 5 mg via INTRAVENOUS

## 2014-06-22 MED ORDER — KETOROLAC TROMETHAMINE 30 MG/ML IJ SOLN
INTRAMUSCULAR | Status: DC | PRN
  Administered 2014-06-22: 30 mg via INTRAVENOUS

## 2014-06-22 SURGICAL SUPPLY — 44 items
BAG URO DRAIN 2000ML W/SPOUT (MISCELLANEOUS) ×2 IMPLANT
BLADE SURG SZ11 CARB STEEL (BLADE) ×2 IMPLANT
CANISTER SUCT 1200ML W/VALVE (MISCELLANEOUS) ×2 IMPLANT
CATH FOLEY 2WAY  5CC 16FR (CATHETERS) ×1
CATH ROBINSON RED A/P 16FR (CATHETERS) IMPLANT
CATH URTH 16FR FL 2W BLN LF (CATHETERS) ×1 IMPLANT
CHLORAPREP W/TINT 26ML (MISCELLANEOUS) ×2 IMPLANT
CORD MONOPOLAR M/FML 12FT (MISCELLANEOUS) ×2 IMPLANT
DEFOGGER SCOPE WARMER CLEARIFY (MISCELLANEOUS) ×2 IMPLANT
DRESSING TELFA 4X3 1S ST N-ADH (GAUZE/BANDAGES/DRESSINGS) IMPLANT
DRSG TEGADERM 2-3/8X2-3/4 SM (GAUZE/BANDAGES/DRESSINGS) IMPLANT
DRSG TEGADERM 2X2.25 PEDS (GAUZE/BANDAGES/DRESSINGS) ×2 IMPLANT
ENDOPOUCH RETRIEVER 10 (MISCELLANEOUS) IMPLANT
GAUZE SPONGE 4X4 12PLY STRL (GAUZE/BANDAGES/DRESSINGS) ×2 IMPLANT
GAUZE SPONGE NON-WVN 2X2 STRL (MISCELLANEOUS) IMPLANT
GLOVE BIO SURGEON STRL SZ7 (GLOVE) ×10 IMPLANT
GLOVE INDICATOR 7.5 STRL GRN (GLOVE) ×8 IMPLANT
GOWN STRL REUS W/ TWL LRG LVL3 (GOWN DISPOSABLE) ×3 IMPLANT
GOWN STRL REUS W/ TWL XL LVL3 (GOWN DISPOSABLE) ×1 IMPLANT
GOWN STRL REUS W/TWL LRG LVL3 (GOWN DISPOSABLE) ×3
GOWN STRL REUS W/TWL XL LVL3 (GOWN DISPOSABLE) ×1
GRASPER SUT TROCAR 14GX15 (MISCELLANEOUS) IMPLANT
IRRIGATION STRYKERFLOW (MISCELLANEOUS) ×1 IMPLANT
IRRIGATOR STRYKERFLOW (MISCELLANEOUS) ×2
IV LACTATED RINGERS 1000ML (IV SOLUTION) ×2 IMPLANT
KIT RM TURNOVER CYSTO AR (KITS) ×2 IMPLANT
LABEL OR SOLS (LABEL) ×2 IMPLANT
LIGASURE BLUNT 5MM 37CM (INSTRUMENTS) ×2 IMPLANT
LIQUID BAND (GAUZE/BANDAGES/DRESSINGS) ×2 IMPLANT
NS IRRIG 500ML POUR BTL (IV SOLUTION) ×2 IMPLANT
PACK GYN LAPAROSCOPIC (MISCELLANEOUS) ×2 IMPLANT
PAD OB MATERNITY 4.3X12.25 (PERSONAL CARE ITEMS) ×2 IMPLANT
PAD PREP 24X41 OB/GYN DISP (PERSONAL CARE ITEMS) ×2 IMPLANT
POUCH ENDO CATCH 10MM SPEC (MISCELLANEOUS) ×2 IMPLANT
SCISSORS METZENBAUM CVD 33 (INSTRUMENTS) ×2 IMPLANT
SLEEVE ENDOPATH XCEL 5M (ENDOMECHANICALS) ×4 IMPLANT
SPONGE VERSALON 2X2 STRL (MISCELLANEOUS)
SUT VIC AB 2-0 UR6 27 (SUTURE) IMPLANT
SUT VIC AB 4-0 PS2 18 (SUTURE) IMPLANT
SYRINGE 10CC LL (SYRINGE) ×2 IMPLANT
TROCAR BALLN 10M OMST10SB SPAC (TROCAR) IMPLANT
TROCAR BLUNT TIP 12MM OMST12BT (TROCAR) ×2 IMPLANT
TROCAR XCEL UNIV SLVE 11M 100M (ENDOMECHANICALS) IMPLANT
TUBING INSUFFLATOR HI FLOW (MISCELLANEOUS) ×2 IMPLANT

## 2014-06-22 NOTE — Discharge Instructions (Signed)
[facility] Laparoscopic Surgery Discharge Instructions  Instructions Following Laparoscopic Surgery You have just undergone a laparoscopic surgery.  The following list should answer your most common questions.  Although we will discuss your surgery and post-operative instructions with you prior to your discharge, this list will serve as a reminder if you fail to recall the details of what we discussed.  We will discuss your surgery once again in detail at your post-op visit in two to four weeks. If you havent already done so, please call to make your appointment as soon as possible.  How you will feel: Although you have just undergone a major surgery, your recovery will be significantly shorter since the surgery was performed through much smaller incisions than the traditional approach.  You should feel slightly better each day.  If you suddenly feel much worse than the prior day, please call the clinic.  Its important during the early part of your recovery that you maintain some activity.  Walking is encouraged.  You will quicken your recovery by continued activity.  Incision:  Your incisions will be closed with dissolvable stitches or surgical adhesive (glue).  There may be Band-aids and/or Steri-strips covering your incisions.  If there is no drainage from the incisions you may remove the Band-aids in one to two days.  You may notice some minor bruising at the incision sites.  This is common and will resolve within several days.  Please inform us if the redness at the edges of your incision appears to be spreading.  If the skin around your incision becomes warm to the touch, or if you notice a pus-like drainage, please call the office.  Stairs/Driving/Activities: You may climb stairs if necessary.  If youve had general anesthesia, do not drive a car the rest of the day today.  You may begin light housework when you feel up to it, but avoid heavy lifting or pushing until cleared for these  activities by your physician.  Hygiene:  Do not soak your incisions.  Showers are acceptable but you may not take a bath or swim in a pool.  Cleanse your incisions daily with soap and water.  Medications:  Please resume taking any medications that you were taking prior to the surgery.  If we have prescribed any new medications for you, please take them as directed.  Constipation:  It is fairly common to experience some difficulty in moving your bowels following major surgery.  Being active will help to reduce this likelihood. A diet rich in fiber and plenty of liquids is desirable.  If you do become constipated, a mild laxative such as Miralax, Milk of Magnesia, or Metamucil, or a stool softener such as Colace, is recommended.  General Instructions:  If you develop a fever of 100.5 degrees or higher, please call [phone] daytime, or [phone] after hours and ask for the [facility] physician on call.  [place] ([place]) [address] [place], [place] [address] Phone: [phone] Fax: [phone]  [place] [address] [place], [place] [address] Phone: [phone] Fax: [phone]

## 2014-06-22 NOTE — Anesthesia Procedure Notes (Signed)
Procedure Name: Intubation Performed by: Leander Rams Pre-anesthesia Checklist: Patient identified Patient Re-evaluated:Patient Re-evaluated prior to inductionOxygen Delivery Method: Circle system utilized Preoxygenation: Pre-oxygenation with 100% oxygen Intubation Type: IV induction Ventilation: Mask ventilation without difficulty Laryngoscope Size: 3 Grade View: Grade II Tube type: Oral Tube size: 7.0 mm Number of attempts: 1 Airway Equipment and Method: Stylet Placement Confirmation: ETT inserted through vocal cords under direct vision Tube secured with: Tape Dental Injury: Teeth and Oropharynx as per pre-operative assessment  Difficulty Due To: Difficulty was unanticipated Future Recommendations: Recommend- induction with short-acting agent, and alternative techniques readily available

## 2014-06-22 NOTE — Anesthesia Preprocedure Evaluation (Signed)
Anesthesia Evaluation  Patient identified by MRN, date of birth, ID band Patient awake    Reviewed: Allergy & Precautions, NPO status , Patient's Chart, lab work & pertinent test results  History of Anesthesia Complications Negative for: history of anesthetic complications  Airway Mallampati: II       Dental  (+) Partial Lower   Pulmonary neg pulmonary ROS,    Pulmonary exam normal       Cardiovascular negative cardio ROS Normal cardiovascular exam    Neuro/Psych negative neurological ROS  negative psych ROS   GI/Hepatic negative GI ROS, Neg liver ROS,   Endo/Other  negative endocrine ROS  Renal/GU negative Renal ROS  negative genitourinary   Musculoskeletal negative musculoskeletal ROS (+)   Abdominal Normal abdominal exam  (+)   Peds negative pediatric ROS (+)  Hematology  (+) anemia ,   Anesthesia Other Findings   Reproductive/Obstetrics negative OB ROS                             Anesthesia Physical Anesthesia Plan  ASA: II  Anesthesia Plan: General   Post-op Pain Management:    Induction: Intravenous  Airway Management Planned: Oral ETT  Additional Equipment:   Intra-op Plan:   Post-operative Plan: Extubation in OR  Informed Consent: I have reviewed the patients History and Physical, chart, labs and discussed the procedure including the risks, benefits and alternatives for the proposed anesthesia with the patient or authorized representative who has indicated his/her understanding and acceptance.     Plan Discussed with: CRNA  Anesthesia Plan Comments:         Anesthesia Quick Evaluation

## 2014-06-22 NOTE — H&P (Signed)
GYN H&P  All questions asked and answered from Mrs. Gatchalian and ready to proceed with laparoscopic BSO when all parties are ready.  Durene Romans MD [facility]  Pager: [phone]

## 2014-06-22 NOTE — Transfer of Care (Signed)
Immediate Anesthesia Transfer of Care Note  Patient: Martha Whitney  Procedure(s) Performed: Procedure(s): LAPAROSCOPIC SALPINGO OOPHORECTOMY (Bilateral)  Patient Location: PACU  Anesthesia Type:General  Level of Consciousness: awake  Airway & Oxygen Therapy: Patient Spontanous Breathing  Post-op Assessment: Report given to RN  Post vital signs: Reviewed  Last Vitals:  Filed Vitals:   06/22/14 0925  BP: 133/76  Pulse: 91  Temp: 36.4 C  Resp: 15    Complications: No apparent anesthesia complications

## 2014-06-22 NOTE — Op Note (Signed)
Operative Note   06/22/2014  PRE-OP DIAGNOSIS: complex right ovarian cyst. History of endometriosis   POST-OP DIAGNOSIS: Same   SURGEON: Surgeon(s) and Role:    * Aletha Halim, MD - Primary  ASSISTANT:    * Gae Dry, MD - Assisting  ANESTHESIA: General and local  PROCEDURE: Bilateral salpingo-oophorectomy and lysis of adhesions <45 minutes  ESTIMATED BLOOD LOSS: 10mL  DRAINS: indwelling foley 228mL   TOTAL IV FLUIDS: 1L crystalloid  SPECIMENS: bilateral tubes and ovaries to pathology  VTE PROPHYLAXIS: SCDs to the bilateral lower extremities  ANTIBIOTICS: none indicated  COMPLICATIONS: none  DISPOSITION: PACU - hemodynamically stable.  CONDITION: stable  FINDINGS:  Laparoscopic survey of the abdomen revealed omental adhesion to just to the left and upper part of the umbilicus. No adhesions noted in the pelvis except for filmy adhesions in the left deep pelvis between the bowel and sidewall. Right ovary approximately 3-4cm with dark cystic material with fallopian tube stub (normal appearing attached to it). Left ovary was normal 1-1.5cm, atrophic and normal appearing and adherent into the left pelvic sidewall; no fallopian tube was noted. Bilateral ureters normal. Small spots of endometriosis noted in the lower pelvis and evidence of it in the utero-sacral areas vs scar tissue from prior hysterectomy  PROCEDURE IN DETAIL: The patient was taken to the OR where anesthesia was administed. The patient was positioned in dorsal lithotomy in the [place]. The patient was then examined under anesthesia with the above noted findings. The patient was prepped and draped in the normal sterile fashion and foley catheter was placed.  After changing gloves, attention was turned to the patient's abdomen where a 10 mm skin incision was made in the umbilical fold, after injection of local anesthesia. The abdomen was then entered via the open technique and the blunt trocar placed,  under direct visualization. Pneumoperitoneum was obtained, and the operative laparoscope was introduced into the abdomen with the above noted findings, after reviewing the entry site and trendelenburg was done. Two 26mm ports were then placed in the right and lower abdomen, under direct visualization after injection of local. The left lower pelvic adhesions were then easily lysed with the Ligasure and the bowel completely removed from the operative field. Next, the left ureter was seen well away from the right IP, the IP isolated and a window made and then serially coagulated and cut with the Ligasure; this was then done on the remaining tissue holding it the the peritoneum. The endocatch was then placed and used to removed the right tube and ovary intact, through the umbilical port.  Attention was then turn to the left ovary. A 53mm suprapubic port was then placed after injection of local.The left ureter wasn't definitively seen so the filmy bowel adhesions to the left pelvic brim were easily lysed with the monopolar scissors on coag and cold current; this opened up that area and exposed the ureter and it's course well away from the left IP.  The left ovary was then tented up and dissected off the left abdominal pelvic wall by going retroperitoneal and dissecting it out and freeing it. The IP was then isolated and a window made, noting it to be well away from the left ureter and serially coagulated and cut.  Next, the remaining adherent tissue was coag'ed and cut with the Ligasure device, freeing the left ovary, which was removed through the umbilical port.  The pressure was then turned to 27mmHg and all operative sites inspected and irrigated. Hemostasis  was noted except at one slightly oozy spot on the left side, were the ovary had been removed and this was coagulated with the monopolar scissors.  All ports were then removed from the belly under direct visualization and the gas released prior to removal of the  umbilical port.  The fascia at the umbilical incision was reapproximated with 0 vicryl in a figure of eight fashion. The skin was closed with 4-0 and the remaining 39mm sites closed with dermabond. The foley catheter was removed. The patient tolerated the procedure well. All counts were correct x 2. The patient was transferred to the recovery room awake, alert and breathing independently.   Durene Romans MD [facility]  Pager: [phone]

## 2014-06-22 NOTE — Brief Op Note (Signed)
Brief Op Note 06/22/2014  9:09 AM  PATIENT:  Martha Whitney  49 y.o. female  PRE-OPERATIVE DIAGNOSIS:  RIGHT Complex OVARIAN CYST  POST-OPERATIVE DIAGNOSIS:  Same   Procedure(s): LAPAROSCOPIC SALPINGO OOPHORECTOMY (Bilateral)  SURGEON:  Surgeon(s) and Role:    * Aletha Halim, MD - Primary  ASSISTANTS:    * Gae Dry, MD - Assisting   ANESTHESIA:  General and local  EBL:  Total I/O In: -  Out: 275 [Urine:250; Blood:25]  DRAINS: indwelling foley   SPECIMEN:  Source of Specimen:  bilateral tubes and ovaries  DISPOSITION OF SPECIMEN:  PATHOLOGY  PLAN OF CARE: Discharge to home after PACU  PATIENT DISPOSITION:  PACU - hemodynamically stable.   Durene Romans MD [facility]  Pager: [phone]

## 2014-06-22 NOTE — Discharge Summary (Signed)
Gynecology Discharge Summary Date of Admission: 06/22/2014 Date of Discharge: 06/22/2014  The patient was admitted, as scheduled, and underwent a l/s BSO; please refer to operative note for full details.  She was meeting all post op goals and discharged to home from the PACU    Medication List    TAKE these medications        amoxicillin 875 MG tablet  Commonly known as:  AMOXIL  Take 875 mg by mouth 2 (two) times daily.     docusate sodium 100 MG capsule  Commonly known as:  COLACE  Take 1 capsule (100 mg total) by mouth 2 (two) times daily as needed for mild constipation.     gabapentin 400 MG capsule  Commonly known as:  NEURONTIN  Take 400 mg by mouth daily as needed (restless legs). TAKE 1 CAPSULE BY MOUTH AT BEDTIME     ibuprofen 200 MG tablet  Commonly known as:  MOTRIN IB  Take 1 tablet (200 mg total) by mouth every 6 (six) hours as needed.     magic mouthwash Soln  5 mLs by Mouth Rinse route daily as needed ([facility] magic mouthwash for mouth sores after dentist appointment).     meloxicam 7.5 MG tablet  Commonly known as:  MOBIC  Take 7.5 mg by mouth daily as needed for pain.     ondansetron 4 MG tablet  Commonly known as:  ZOFRAN  Take 1 tablet (4 mg total) by mouth every 6 (six) hours as needed for nausea or vomiting.     oxyCODONE-acetaminophen 5-325 MG per tablet  Commonly known as:  ROXICET  Take 1 tablet by mouth every 6 (six) hours as needed for severe pain.     oxyCODONE-acetaminophen 5-325 MG per tablet  Commonly known as:  PERCOCET/ROXICET  Take 1-2 tablets by mouth every 4 (four) hours as needed (moderate to severe pain (when tolerating fluids)).     phentermine 37.5 MG capsule  Take 37.5 mg by mouth every morning.     traZODone 50 MG tablet  Commonly known as:  DESYREL  Take 50-200 mg by mouth at bedtime as needed (restless leg).        No future appointments.Patient is to follow up for 3 week post op follow up  Durene Romans.  MD [facility]

## 2014-06-23 LAB — SURGICAL PATHOLOGY

## 2014-06-25 NOTE — Anesthesia Postprocedure Evaluation (Signed)
  Anesthesia Post-op Note  Patient: Martha Whitney  Procedure(s) Performed: Procedure(s): LAPAROSCOPIC SALPINGO OOPHORECTOMY (Bilateral)  Anesthesia type:General  Patient location: PACU  Post pain: Pain level controlled  Post assessment: Post-op Vital signs reviewed, Patient's Cardiovascular Status Stable, Respiratory Function Stable, Patent Airway and No signs of Nausea or vomiting  Post vital signs: Reviewed and stable  Last Vitals:  Filed Vitals:   06/22/14 1136  BP: 110/76  Pulse: 87  Temp: 36.3 C  Resp: 16    Level of consciousness: awake, alert  and patient cooperative  Complications: No apparent anesthesia complications

## 2014-08-16 ENCOUNTER — Other Ambulatory Visit: Payer: Self-pay | Admitting: Internal Medicine

## 2014-08-16 DIAGNOSIS — R102 Pelvic and perineal pain: Secondary | ICD-10-CM

## 2014-08-18 ENCOUNTER — Ambulatory Visit
Admission: RE | Admit: 2014-08-18 | Discharge: 2014-08-18 | Disposition: A | Discharge status: Home / Self Care | Payer: BC Managed Care – PPO | Source: Ambulatory Visit | Attending: Internal Medicine | Admitting: Internal Medicine

## 2014-08-18 DIAGNOSIS — R102 Pelvic and perineal pain unspecified side: Secondary | ICD-10-CM

## 2014-08-18 DIAGNOSIS — Z90710 Acquired absence of both cervix and uterus: Secondary | ICD-10-CM | POA: Diagnosis not present

## 2014-10-13 ENCOUNTER — Encounter: Payer: Self-pay | Admitting: *Deleted

## 2014-10-13 ENCOUNTER — Other Ambulatory Visit: Payer: BC Managed Care – PPO

## 2014-10-13 NOTE — Patient Instructions (Signed)
  Your procedure is scheduled on: 10-21-14 Report to [place] To find out your arrival time please call [phone] between 1PM - 3PM on 10-20-14  Remember: Instructions that are not followed completely may result in serious medical risk, up to and including death, or upon the discretion of your surgeon and anesthesiologist your surgery may need to be rescheduled.    __X__ 1. Do not eat food or drink liquids after midnight. No gum chewing or hard candies.     __X__ 2. No Alcohol for 24 hours before or after surgery.   ____ 3. Bring all medications with you on the day of surgery if instructed.    ____ 4. Notify your doctor if there is any change in your medical condition     (cold, fever, infections).     Do not wear jewelry, make-up, hairpins, clips or nail polish.  Do not wear lotions, powders, or perfumes. You may wear deodorant.  Do not shave 48 hours prior to surgery. Men may shave face and neck.  Do not bring valuables to the hospital.    [facility] is not responsible for any belongings or valuables.               Contacts, dentures or bridgework may not be worn into surgery.  Leave your suitcase in the car. After surgery it may be brought to your room.  For patients admitted to the hospital, discharge time is determined by your treatment team.   Patients discharged the day of surgery will not be allowed to drive home.   Please read over the following fact sheets that you were given:      ____ Take these medicines the morning of surgery with A SIP OF WATER:    1. NONE  2.   3.   4.  5.  6.  ____ Fleet Enema (as directed)   ____ Use CHG Soap as directed  ____ Use inhalers on the day of surgery  ____ Stop metformin 2 days prior to surgery    ____ Take 1/2 of usual insulin dose the night before surgery and none on the morning of surgery.   ____ Stop Coumadin/Plavix/aspirin-N/A  __X__ Stop Anti-inflammatories-STOP MELOXICAM NOW-NO  NSAIDS OR ASA PRODUCTS-TYLENOL OK   __X__ Stop supplements until after surgery-STOP PHENTERMINE NOW   ____ Bring C-Pap to the hospital.

## 2014-10-20 ENCOUNTER — Encounter: Payer: Self-pay | Admitting: *Deleted

## 2014-10-21 ENCOUNTER — Ambulatory Visit: Payer: BC Managed Care – PPO | Admitting: Anesthesiology

## 2014-10-21 ENCOUNTER — Ambulatory Visit
Admission: RE | Admit: 2014-10-21 | Discharge: 2014-10-21 | Disposition: A | Discharge status: Home / Self Care | Payer: BC Managed Care – PPO | Source: Ambulatory Visit | Attending: Orthopedic Surgery | Admitting: Orthopedic Surgery

## 2014-10-21 ENCOUNTER — Encounter: Payer: Self-pay | Admitting: *Deleted

## 2014-10-21 ENCOUNTER — Encounter
Admission: RE | Disposition: A | Discharge status: Home / Self Care | Payer: Self-pay | Source: Ambulatory Visit | Attending: Orthopedic Surgery

## 2014-10-21 DIAGNOSIS — Z9049 Acquired absence of other specified parts of digestive tract: Secondary | ICD-10-CM | POA: Diagnosis not present

## 2014-10-21 DIAGNOSIS — Z87442 Personal history of urinary calculi: Secondary | ICD-10-CM | POA: Insufficient documentation

## 2014-10-21 DIAGNOSIS — R739 Hyperglycemia, unspecified: Secondary | ICD-10-CM | POA: Diagnosis not present

## 2014-10-21 DIAGNOSIS — Z90710 Acquired absence of both cervix and uterus: Secondary | ICD-10-CM | POA: Insufficient documentation

## 2014-10-21 DIAGNOSIS — Z888 Allergy status to other drugs, medicaments and biological substances status: Secondary | ICD-10-CM | POA: Insufficient documentation

## 2014-10-21 DIAGNOSIS — Z79899 Other long term (current) drug therapy: Secondary | ICD-10-CM | POA: Insufficient documentation

## 2014-10-21 DIAGNOSIS — Z833 Family history of diabetes mellitus: Secondary | ICD-10-CM | POA: Insufficient documentation

## 2014-10-21 DIAGNOSIS — M67432 Ganglion, left wrist: Secondary | ICD-10-CM | POA: Diagnosis not present

## 2014-10-21 DIAGNOSIS — G2581 Restless legs syndrome: Secondary | ICD-10-CM | POA: Diagnosis not present

## 2014-10-21 DIAGNOSIS — Z882 Allergy status to sulfonamides status: Secondary | ICD-10-CM | POA: Insufficient documentation

## 2014-10-21 DIAGNOSIS — Z8371 Family history of colonic polyps: Secondary | ICD-10-CM | POA: Insufficient documentation

## 2014-10-21 DIAGNOSIS — Z8249 Family history of ischemic heart disease and other diseases of the circulatory system: Secondary | ICD-10-CM | POA: Diagnosis not present

## 2014-10-21 DIAGNOSIS — M1380 Other specified arthritis, unspecified site: Secondary | ICD-10-CM | POA: Diagnosis not present

## 2014-10-21 DIAGNOSIS — E7800 Pure hypercholesterolemia, unspecified: Secondary | ICD-10-CM | POA: Insufficient documentation

## 2014-10-21 DIAGNOSIS — Z887 Allergy status to serum and vaccine status: Secondary | ICD-10-CM | POA: Diagnosis not present

## 2014-10-21 DIAGNOSIS — M6740 Ganglion, unspecified site: Secondary | ICD-10-CM | POA: Diagnosis present

## 2014-10-21 HISTORY — PX: GANGLION CYST EXCISION: SHX1691

## 2014-10-21 HISTORY — DX: Chronic kidney disease, unspecified: N18.9

## 2014-10-21 SURGERY — EXCISION, GANGLION CYST, WRIST
Anesthesia: General | Site: Wrist | Laterality: Left | Wound class: Clean

## 2014-10-21 MED ORDER — HYDROCODONE-ACETAMINOPHEN 5-325 MG PO TABS: 1.0000 | ORAL_TABLET | Freq: Four times a day (QID) | ORAL | Status: DC | PRN

## 2014-10-21 MED ORDER — BUPIVACAINE HCL (PF) 0.5 % IJ SOLN
INTRAMUSCULAR | Status: AC
  Filled 2014-10-21: qty 30

## 2014-10-21 MED ORDER — ONDANSETRON HCL 4 MG/2ML IJ SOLN: 4.0000 mg | Freq: Four times a day (QID) | INTRAMUSCULAR | Status: DC | PRN

## 2014-10-21 MED ORDER — HYDROCODONE-ACETAMINOPHEN 5-325 MG PO TABS
ORAL_TABLET | ORAL | Status: DC
Start: 2014-10-21 — End: 2014-10-21
  Filled 2014-10-21: qty 1

## 2014-10-21 MED ORDER — METOCLOPRAMIDE HCL 5 MG/ML IJ SOLN: 5.0000 mg | Freq: Three times a day (TID) | INTRAMUSCULAR | Status: DC | PRN

## 2014-10-21 MED ORDER — ONDANSETRON HCL 4 MG/2ML IJ SOLN
INTRAMUSCULAR | Status: DC | PRN
Start: 2014-10-21 — End: 2014-10-21
  Administered 2014-10-21: 4 mg via INTRAVENOUS

## 2014-10-21 MED ORDER — FENTANYL CITRATE (PF) 100 MCG/2ML IJ SOLN
INTRAMUSCULAR | Status: DC
Start: 2014-10-21 — End: 2014-10-21
  Filled 2014-10-21: qty 2

## 2014-10-21 MED ORDER — SODIUM CHLORIDE 0.9 % IV SOLN: INTRAVENOUS | Status: DC

## 2014-10-21 MED ORDER — FAMOTIDINE 20 MG PO TABS
ORAL_TABLET | ORAL | Status: AC
  Administered 2014-10-21: 20 mg via ORAL
  Filled 2014-10-21: qty 1

## 2014-10-21 MED ORDER — CEFAZOLIN SODIUM-DEXTROSE 2-3 GM-% IV SOLR
INTRAVENOUS | Status: AC
  Administered 2014-10-21: 2 g via INTRAVENOUS
  Filled 2014-10-21: qty 50

## 2014-10-21 MED ORDER — FENTANYL CITRATE (PF) 100 MCG/2ML IJ SOLN
INTRAMUSCULAR | Status: AC
  Administered 2014-10-21: 25 ug via INTRAVENOUS
  Filled 2014-10-21: qty 2

## 2014-10-21 MED ORDER — PROMETHAZINE HCL 25 MG/ML IJ SOLN: 6.2500 mg | INTRAMUSCULAR | Status: DC | PRN

## 2014-10-21 MED ORDER — GLYCOPYRROLATE 0.2 MG/ML IJ SOLN
INTRAMUSCULAR | Status: DC | PRN
  Administered 2014-10-21: 0.2 mg via INTRAVENOUS

## 2014-10-21 MED ORDER — METOCLOPRAMIDE HCL 10 MG PO TABS: 5.0000 mg | ORAL_TABLET | Freq: Three times a day (TID) | ORAL | Status: DC | PRN

## 2014-10-21 MED ORDER — LIDOCAINE HCL (CARDIAC) 20 MG/ML IV SOLN
INTRAVENOUS | Status: DC | PRN
  Administered 2014-10-21: 80 mg via INTRAVENOUS

## 2014-10-21 MED ORDER — FENTANYL CITRATE (PF) 100 MCG/2ML IJ SOLN
INTRAMUSCULAR | Status: DC | PRN
  Administered 2014-10-21 (×2): 50 ug via INTRAVENOUS

## 2014-10-21 MED ORDER — PHENYLEPHRINE HCL 10 MG/ML IJ SOLN
INTRAMUSCULAR | Status: DC | PRN
  Administered 2014-10-21: 100 ug via INTRAVENOUS

## 2014-10-21 MED ORDER — DEXAMETHASONE SODIUM PHOSPHATE 4 MG/ML IJ SOLN
INTRAMUSCULAR | Status: DC | PRN
  Administered 2014-10-21: 5 mg via INTRAVENOUS

## 2014-10-21 MED ORDER — FENTANYL CITRATE (PF) 100 MCG/2ML IJ SOLN
25.0000 ug | INTRAMUSCULAR | Status: AC | PRN
  Administered 2014-10-21 (×6): 25 ug via INTRAVENOUS

## 2014-10-21 MED ORDER — ONDANSETRON HCL 4 MG PO TABS: 4.0000 mg | ORAL_TABLET | Freq: Four times a day (QID) | ORAL | Status: DC | PRN

## 2014-10-21 MED ORDER — CEFAZOLIN SODIUM-DEXTROSE 2-3 GM-% IV SOLR: 2.0000 g | Freq: Once | INTRAVENOUS | Status: DC

## 2014-10-21 MED ORDER — FAMOTIDINE 20 MG PO TABS
20.0000 mg | ORAL_TABLET | Freq: Once | ORAL | Status: AC
  Administered 2014-10-21: 20 mg via ORAL

## 2014-10-21 MED ORDER — PROPOFOL 10 MG/ML IV BOLUS
INTRAVENOUS | Status: DC | PRN
  Administered 2014-10-21: 120 mg via INTRAVENOUS

## 2014-10-21 MED ORDER — CEFAZOLIN SODIUM-DEXTROSE 2-3 GM-% IV SOLR
2.0000 g | Freq: Once | INTRAVENOUS | Status: AC
  Administered 2014-10-21: 2 g via INTRAVENOUS

## 2014-10-21 MED ORDER — LACTATED RINGERS IV SOLN
INTRAVENOUS | Status: DC
Start: 2014-10-21 — End: 2014-10-21
  Administered 2014-10-21 (×2): via INTRAVENOUS

## 2014-10-21 MED ORDER — EPHEDRINE SULFATE 50 MG/ML IJ SOLN
INTRAMUSCULAR | Status: DC | PRN
  Administered 2014-10-21: 5 mg via INTRAVENOUS
  Administered 2014-10-21: 10 mg via INTRAVENOUS

## 2014-10-21 MED ORDER — MIDAZOLAM HCL 2 MG/2ML IJ SOLN
INTRAMUSCULAR | Status: DC | PRN
  Administered 2014-10-21: 2 mg via INTRAVENOUS

## 2014-10-21 MED ORDER — HYDROCODONE-ACETAMINOPHEN 5-325 MG PO TABS
1.0000 | ORAL_TABLET | ORAL | Status: DC | PRN
  Administered 2014-10-21: 1 via ORAL

## 2014-10-21 SURGICAL SUPPLY — 28 items
BANDAGE ELASTIC 3 CLIP NS LF (GAUZE/BANDAGES/DRESSINGS) ×2 IMPLANT
BNDG ESMARK 4X12 TAN STRL LF (GAUZE/BANDAGES/DRESSINGS) ×2 IMPLANT
CANISTER SUCT 1200ML W/VALVE (MISCELLANEOUS) ×2 IMPLANT
CAST PADDING 3X4FT ST 30246 (SOFTGOODS) ×1
CHLORAPREP W/TINT 26ML (MISCELLANEOUS) ×2 IMPLANT
ELECT CAUTERY NEEDLE 2.0 MIC (NEEDLE) ×2 IMPLANT
GAUZE PETRO XEROFOAM 1X8 (MISCELLANEOUS) ×2 IMPLANT
GAUZE SPONGE 4X4 12PLY STRL (GAUZE/BANDAGES/DRESSINGS) ×2 IMPLANT
GLOVE BIOGEL PI IND STRL 9 (GLOVE) ×1 IMPLANT
GLOVE BIOGEL PI INDICATOR 9 (GLOVE) ×1
GLOVE SURG ORTHO 9.0 STRL STRW (GLOVE) ×2 IMPLANT
GOWN SPECIALTY ULTRA XL (MISCELLANEOUS) ×2 IMPLANT
GOWN STRL REUS W/ TWL LRG LVL3 (GOWN DISPOSABLE) ×1 IMPLANT
GOWN STRL REUS W/TWL LRG LVL3 (GOWN DISPOSABLE) ×1
KIT RM TURNOVER STRD PROC AR (KITS) ×2 IMPLANT
NEEDLE HYPO 25X1 1.5 SAFETY (NEEDLE) ×2 IMPLANT
NS IRRIG 500ML POUR BTL (IV SOLUTION) ×2 IMPLANT
PACK EXTREMITY ARMC (MISCELLANEOUS) ×2 IMPLANT
PAD CAST CTTN 3X4 STRL (SOFTGOODS) ×1 IMPLANT
SPLINT CAST 1 STEP 3X12 (MISCELLANEOUS) ×2 IMPLANT
STOCKINETTE STRL 4IN 9604848 (GAUZE/BANDAGES/DRESSINGS) ×2 IMPLANT
SUT ETHILON 4-0 (SUTURE)
SUT ETHILON 4-0 FS2 18XMFL BLK (SUTURE)
SUT ETHILON 5-0 FS-2 18 BLK (SUTURE) ×2 IMPLANT
SUT MNCRL 4-0 (SUTURE)
SUT MNCRL 4-0 27XMFL (SUTURE)
SUTURE ETHLN 4-0 FS2 18XMF BLK (SUTURE) IMPLANT
SUTURE MNCRL 4-0 27XMF (SUTURE) IMPLANT

## 2014-10-21 NOTE — Op Note (Signed)
10/21/2014  2:58 PM  PATIENT:  Martha Whitney  49 y.o. female  PRE-OPERATIVE DIAGNOSIS:  GANGLION CYST left volar  POST-OPERATIVE DIAGNOSIS:  GANGLION CYST left volar  PROCEDURE:  Procedure(s): REMOVAL GANGLION OF WRIST (Left)  SURGEON: Laurene Footman, MD  ASSISTANTS: None  ANESTHESIA:   general  EBL:    minimal  BLOOD ADMINISTERED:none  DRAINS: none   LOCAL MEDICATIONS USED:  NONE  SPECIMEN:  Source of Specimen:  Volar ganglion cyst left wrist  DISPOSITION OF SPECIMEN:  PATHOLOGY  COUNTS:  YES  TOURNIQUET:   11 minutes into 250 mm mercury  IMPLANTS: None  DICTATION: .Dragon Dictation patient brought the operating room, and after adequate anesthesia was obtained the left arm was prepped and draped in sterile fashion was turned by the upper arm. After appropriate patient identification and timeout procedure were completed tourniquet was raised. A volar incision was made curvilinear over the cyst. Incision was carried down through the skin and subcutaneous tissue. Set the radial artery and associated veins were identified and protected the cyst was exposed proximally and then tracked down distally to the radial carpal joint where it was arising from the cyst was excised proximally and set a portion set of specimen with thick fluid consistent with ganglion cyst being present. At the volar radiocarpal joint cautery was used to abrade the tissue and try to prevent cause scar tissue and prevent recurrence. The wound was thoroughly irrigated. Tourniquet was let down and there were was no significant bleeding, no arterial bleeding. The wound was closed with simple interrupted 50 skilled nylon skin sutures followed by Xeroform 4 x 4's web roll and volar splint and Ace wrap  PLAN OF CARE: Discharge to home after PACU  PATIENT DISPOSITION:  PACU - hemodynamically stable.

## 2014-10-21 NOTE — H&P (Signed)
Reviewed paper H+P, will be scanned into chart. No changes noted.  

## 2014-10-21 NOTE — Transfer of Care (Signed)
Immediate Anesthesia Transfer of Care Note  Patient: Martha Whitney  Procedure(s) Performed: Procedure(s): REMOVAL GANGLION OF WRIST (Left)  Patient Location: PACU  Anesthesia Type:General  Level of Consciousness: awake  Airway & Oxygen Therapy: Patient Spontanous Breathing and Patient connected to face mask oxygen  Post-op Assessment: Report given to RN  Post vital signs: Reviewed  Last Vitals:  Filed Vitals:   10/21/14 1500  BP: 122/77  Pulse: 99  Temp: 36.1 C  Resp: 13    Complications: No apparent anesthesia complications

## 2014-10-21 NOTE — Anesthesia Procedure Notes (Signed)
Procedure Name: LMA Insertion Date/Time: 10/21/2014 2:18 PM Performed by: [facility] Pre-anesthesia Checklist: Patient identified, Emergency Drugs available, Suction available, Patient being monitored and Timeout performed Patient Re-evaluated:Patient Re-evaluated prior to inductionOxygen Delivery Method: Circle system utilized Preoxygenation: Pre-oxygenation with 100% oxygen Intubation Type: IV induction LMA: LMA inserted LMA Size: 3.5 Number of attempts: 1 Dental Injury: Teeth and Oropharynx as per pre-operative assessment

## 2014-10-21 NOTE — Anesthesia Preprocedure Evaluation (Signed)
Anesthesia Evaluation  Patient identified by MRN, date of birth, ID band Patient awake    Reviewed: Allergy & Precautions, H&P , NPO status , Patient's Chart, lab work & pertinent test results, reviewed documented beta blocker date and time   History of Anesthesia Complications Negative for: history of anesthetic complications  Airway Mallampati: II  TM Distance: >3 FB Neck ROM: full    Dental no notable dental hx. (+) Partial Upper   Pulmonary neg pulmonary ROS,    Pulmonary exam normal breath sounds clear to auscultation       Cardiovascular Exercise Tolerance: Good negative cardio ROS Normal cardiovascular exam Rhythm:regular Rate:Normal     Neuro/Psych negative neurological ROS  negative psych ROS   GI/Hepatic negative GI ROS, Neg liver ROS,   Endo/Other  negative endocrine ROS  Renal/GU Renal disease (kidney stones)  negative genitourinary   Musculoskeletal   Abdominal   Peds  Hematology negative hematology ROS (+)   Anesthesia Other Findings Past Medical History:   Restless leg syndrome                           2007         Endometriosis determined by laparoscopy                      Chronic kidney disease                                         Comment:kidney stones   Reproductive/Obstetrics negative OB ROS                             Anesthesia Physical Anesthesia Plan  ASA: II  Anesthesia Plan: General   Post-op Pain Management:    Induction:   Airway Management Planned:   Additional Equipment:   Intra-op Plan:   Post-operative Plan:   Informed Consent: I have reviewed the patients History and Physical, chart, labs and discussed the procedure including the risks, benefits and alternatives for the proposed anesthesia with the patient or authorized representative who has indicated his/her understanding and acceptance.   Dental Advisory Given  Plan Discussed  with: Anesthesiologist, CRNA and Surgeon  Anesthesia Plan Comments:         Anesthesia Quick Evaluation

## 2014-10-21 NOTE — Discharge Instructions (Signed)
Loosen Ace wrap if fingers swell. Keep dressing clean and dry. Work fingers is much as you can   AMBULATORY SURGERY  DISCHARGE INSTRUCTIONS   1) The drugs that you were given will stay in your system until tomorrow so for the next 24 hours you should not:  A) Drive an automobile B) Make any legal decisions C) Drink any alcoholic beverage   2) You may resume regular meals tomorrow.  Today it is better to start with liquids and gradually work up to solid foods.  You may eat anything you prefer, but it is better to start with liquids, then soup and crackers, and gradually work up to solid foods.   3) Please notify your doctor immediately if you have any unusual bleeding, trouble breathing, redness and pain at the surgery site, drainage, fever, or pain not relieved by medication.    4) Additional Instructions:        Please contact your physician with any problems or Same Day Surgery at [phone], Monday through Friday 6 am to 4 pm, or [place] at [facility] number at [phone].

## 2014-10-22 ENCOUNTER — Encounter: Payer: Self-pay | Admitting: Orthopedic Surgery

## 2014-10-25 LAB — SURGICAL PATHOLOGY

## 2014-10-25 NOTE — Anesthesia Postprocedure Evaluation (Signed)
  Anesthesia Post-op Note  Patient: Martha Whitney  Procedure(s) Performed: Procedure(s): REMOVAL GANGLION OF WRIST (Left)  Anesthesia type:General  Patient location: PACU  Post pain: Pain level controlled  Post assessment: Post-op Vital signs reviewed, Patient's Cardiovascular Status Stable, Respiratory Function Stable, Patent Airway and No signs of Nausea or vomiting  Post vital signs: Reviewed and stable  Last Vitals:  Filed Vitals:   10/21/14 1622  BP: 109/70  Pulse: 86  Temp:   Resp: 16    Level of consciousness: awake, alert  and patient cooperative  Complications: No apparent anesthesia complications

## 2015-05-22 ENCOUNTER — Encounter: Payer: Self-pay | Admitting: Emergency Medicine

## 2015-05-22 ENCOUNTER — Emergency Department: Payer: BC Managed Care – PPO

## 2015-05-22 ENCOUNTER — Observation Stay
Admission: EM | Admit: 2015-05-22 | Discharge: 2015-05-24 | Disposition: A | Discharge status: Home / Self Care | Payer: BC Managed Care – PPO | Attending: Internal Medicine | Admitting: Internal Medicine

## 2015-05-22 DIAGNOSIS — R0602 Shortness of breath: Secondary | ICD-10-CM | POA: Insufficient documentation

## 2015-05-22 DIAGNOSIS — R0781 Pleurodynia: Secondary | ICD-10-CM | POA: Insufficient documentation

## 2015-05-22 DIAGNOSIS — G2581 Restless legs syndrome: Secondary | ICD-10-CM | POA: Insufficient documentation

## 2015-05-22 DIAGNOSIS — R0789 Other chest pain: Secondary | ICD-10-CM | POA: Diagnosis not present

## 2015-05-22 DIAGNOSIS — Z79899 Other long term (current) drug therapy: Secondary | ICD-10-CM | POA: Insufficient documentation

## 2015-05-22 DIAGNOSIS — D72829 Elevated white blood cell count, unspecified: Secondary | ICD-10-CM | POA: Insufficient documentation

## 2015-05-22 DIAGNOSIS — A419 Sepsis, unspecified organism: Secondary | ICD-10-CM

## 2015-05-22 DIAGNOSIS — Z887 Allergy status to serum and vaccine status: Secondary | ICD-10-CM | POA: Insufficient documentation

## 2015-05-22 DIAGNOSIS — E042 Nontoxic multinodular goiter: Secondary | ICD-10-CM | POA: Diagnosis not present

## 2015-05-22 DIAGNOSIS — Z90710 Acquired absence of both cervix and uterus: Secondary | ICD-10-CM | POA: Insufficient documentation

## 2015-05-22 DIAGNOSIS — Z8249 Family history of ischemic heart disease and other diseases of the circulatory system: Secondary | ICD-10-CM | POA: Diagnosis not present

## 2015-05-22 DIAGNOSIS — J189 Pneumonia, unspecified organism: Secondary | ICD-10-CM | POA: Diagnosis not present

## 2015-05-22 DIAGNOSIS — Z9049 Acquired absence of other specified parts of digestive tract: Secondary | ICD-10-CM | POA: Insufficient documentation

## 2015-05-22 DIAGNOSIS — Z87442 Personal history of urinary calculi: Secondary | ICD-10-CM | POA: Insufficient documentation

## 2015-05-22 DIAGNOSIS — R110 Nausea: Secondary | ICD-10-CM | POA: Insufficient documentation

## 2015-05-22 DIAGNOSIS — Z882 Allergy status to sulfonamides status: Secondary | ICD-10-CM | POA: Insufficient documentation

## 2015-05-22 DIAGNOSIS — R6510 Systemic inflammatory response syndrome (SIRS) of non-infectious origin without acute organ dysfunction: Secondary | ICD-10-CM

## 2015-05-22 DIAGNOSIS — R000 Tachycardia, unspecified: Secondary | ICD-10-CM | POA: Diagnosis present

## 2015-05-22 DIAGNOSIS — Z888 Allergy status to other drugs, medicaments and biological substances status: Secondary | ICD-10-CM | POA: Insufficient documentation

## 2015-05-22 LAB — CBC
HEMATOCRIT: 41.3 % (ref 35.0–47.0)
Hemoglobin: 13.8 g/dL (ref 12.0–16.0)
MCH: 28.9 pg (ref 26.0–34.0)
MCHC: 33.5 g/dL (ref 32.0–36.0)
MCV: 86.2 fL (ref 80.0–100.0)
PLATELETS: 235 10*3/uL (ref 150–440)
RBC: 4.79 MIL/uL (ref 3.80–5.20)
RDW: 12.2 % (ref 11.5–14.5)
WBC: 19.3 10*3/uL — ABNORMAL HIGH (ref 3.6–11.0)

## 2015-05-22 LAB — BASIC METABOLIC PANEL
Anion gap: 8 (ref 5–15)
BUN: 11 mg/dL (ref 6–20)
CHLORIDE: 99 mmol/L — AB (ref 101–111)
CO2: 29 mmol/L (ref 22–32)
CREATININE: 0.84 mg/dL (ref 0.44–1.00)
Calcium: 8.8 mg/dL — ABNORMAL LOW (ref 8.9–10.3)
GFR calc Af Amer: >60 mL/min (ref >60)
GFR calc non Af Amer: >60 mL/min (ref >60)
GLUCOSE: 120 mg/dL — AB (ref 65–99)
POTASSIUM: 3.7 mmol/L (ref 3.5–5.1)
Sodium: 136 mmol/L (ref 135–145)

## 2015-05-22 LAB — TROPONIN I: Troponin I: <0.03 ng/mL (ref <0.031)

## 2015-05-22 MED ORDER — ONDANSETRON 4 MG PO TBDP
ORAL_TABLET | ORAL | Status: AC
  Filled 2015-05-22: qty 1

## 2015-05-22 MED ORDER — SODIUM CHLORIDE 0.9 % IV BOLUS (SEPSIS)
1000.0000 mL | Freq: Once | INTRAVENOUS | Status: AC
  Administered 2015-05-22: 1000 mL via INTRAVENOUS

## 2015-05-22 MED ORDER — DEXTROSE 5 % IV SOLN
500.0000 mg | Freq: Once | INTRAVENOUS | Status: AC
  Administered 2015-05-23: 500 mg via INTRAVENOUS
  Filled 2015-05-22: qty 500

## 2015-05-22 MED ORDER — MORPHINE SULFATE (PF) 4 MG/ML IV SOLN
4.0000 mg | Freq: Once | INTRAVENOUS | Status: AC
  Administered 2015-05-23: 4 mg via INTRAVENOUS
  Filled 2015-05-22: qty 1

## 2015-05-22 MED ORDER — ONDANSETRON HCL 4 MG/2ML IJ SOLN
4.0000 mg | INTRAMUSCULAR | Status: AC
  Administered 2015-05-23: 4 mg via INTRAVENOUS
  Filled 2015-05-22: qty 2

## 2015-05-22 MED ORDER — SODIUM CHLORIDE 0.9 % IV BOLUS (SEPSIS)
1250.0000 mL | INTRAVENOUS | Status: AC
  Administered 2015-05-23: 1250 mL via INTRAVENOUS

## 2015-05-22 MED ORDER — IOPAMIDOL (ISOVUE-370) INJECTION 76%
75.0000 mL | Freq: Once | INTRAVENOUS | Status: AC | PRN
  Administered 2015-05-22: 75 mL via INTRAVENOUS

## 2015-05-22 MED ORDER — DEXTROSE 5 % IV SOLN
1.0000 g | INTRAVENOUS | Status: AC
  Administered 2015-05-23: 1 g via INTRAVENOUS
  Filled 2015-05-22: qty 10

## 2015-05-22 MED ORDER — ONDANSETRON 4 MG PO TBDP
4.0000 mg | ORAL_TABLET | Freq: Once | ORAL | Status: AC | PRN
  Administered 2015-05-22: 4 mg via ORAL

## 2015-05-22 NOTE — ED Provider Notes (Signed)
[facility] Emergency Department Provider Note  ____________________________________________  Time seen: Approximately 11:00 PM  I have reviewed the triage vital signs and the nursing notes.   HISTORY  Chief Complaint Chest Pain and Nausea    HPI Martha Whitney is a 50 y.o. female who presents to the emergency department for evaluation of chest pain and generally feeling ill.  She reports that when she awoke this morning she did not feel well but nothing in particular, just general malaise.  She has not had much of an appetite today and later developed nausea but no vomiting.  She reports that at approximately 3:00 this afternoon (approximately 8 hours ago) she developed left-sided chest pain that she describes as sharp and stabbing, moderate to severe, and is worse with deep inspiration.  She has never had any sensation like this before and it is accompanied with mild shortness of breath.  She then later developed a severe headache which has now lessened to moderate and is global.  Nothing is making any of her symptoms better nor worse.  She is pale and has also had subjective chills but no reported fever.  She has had a mild cough that is nonproductive and does not report feeling ill recently.  She is not a smoker.  She his not ever had any blood clots in her legs nor lungs, does not take exogenous estrogen, has not had surgery recently or otherwise been immobilized.     Past Medical History  Diagnosis Date  . Restless leg syndrome 2007  . Endometriosis determined by laparoscopy   . Chronic kidney disease     kidney stones    There are no active problems to display for this patient.   Past Surgical History  Procedure Laterality Date  . Cholecystectomy    . Tonsillectomy    . Carpal tunnel release Left 2003  . Laparoscopic total hysterectomy    . Cystectomy N/A 1969    cyst removed from throat  . Leep N/A 1995  . Laparoscopy with tubal ligation     . Laparoscopic salpingo oopherectomy Bilateral 06/22/2014    Procedure: LAPAROSCOPIC SALPINGO OOPHORECTOMY;  Surgeon: Aletha Halim, MD;  Location: [facility] ORS;  Service: Gynecology;  Laterality: Bilateral;  . Ganglion cyst excision Left 10/21/2014    Procedure: REMOVAL GANGLION OF WRIST;  Surgeon: Hessie Knows, MD;  Location: [facility] ORS;  Service: Orthopedics;  Laterality: Left;    Current Outpatient Rx  Name  Route  Sig  Dispense  Refill  . gabapentin (NEURONTIN) 400 MG capsule   Oral   Take 400 mg by mouth 2 (two) times daily as needed (restless legs).          . meloxicam (MOBIC) 7.5 MG tablet   Oral   Take 7.5 mg by mouth daily as needed for pain (tennis elbow).          . pramipexole (MIRAPEX) 0.25 MG tablet   Oral   Take 0.25 mg by mouth at bedtime.         . traZODone (DESYREL) 50 MG tablet   Oral   Take 50-100 mg by mouth at bedtime as needed for sleep (restless leg).         Marland Kitchen docusate sodium (COLACE) 100 MG capsule   Oral   Take 1 capsule (100 mg total) by mouth 2 (two) times daily as needed for mild constipation. Patient not taking: Reported on 10/13/2014   60 capsule   0   .  HYDROcodone-acetaminophen (NORCO) 5-325 MG tablet   Oral   Take 1 tablet by mouth every 6 (six) hours as needed for moderate pain. Patient not taking: Reported on 05/22/2015   30 tablet   0   . ibuprofen (MOTRIN IB) 200 MG tablet   Oral   Take 1 tablet (200 mg total) by mouth every 6 (six) hours as needed. Patient not taking: Reported on 10/13/2014   30 tablet   1   . oxyCODONE-acetaminophen (PERCOCET/ROXICET) 5-325 MG per tablet   Oral   Take 1-2 tablets by mouth every 4 (four) hours as needed (moderate to severe pain (when tolerating fluids)). Patient not taking: Reported on 10/13/2014   10 tablet   0   . oxyCODONE-acetaminophen (ROXICET) 5-325 MG per tablet   Oral   Take 1 tablet by mouth every 6 (six) hours as needed for severe pain. Patient not taking: Reported on  05/22/2015   12 tablet   0     Allergies Barbital; Flu virus vaccine; Other; Pneumococcal vaccines; and Sulfa antibiotics  No family history on file.  Social History Social History  Substance Use Topics  . Smoking status: Never Smoker   . Smokeless tobacco: None  . Alcohol Use: Yes     Comment: occasioanaly    Review of Systems Constitutional: +chills, general malaise Eyes: No visual changes. ENT: No sore throat. Cardiovascular: +chest pain. Respiratory: +shortness of breath Gastrointestinal: No abdominal pain.  +nausea, no vomiting.  No diarrhea.  No constipation. Genitourinary: Negative for dysuria. Musculoskeletal: Negative for back pain. Skin: Negative for rash. Neurological: Negative for headaches, focal weakness or numbness.  10-point ROS otherwise negative.  ____________________________________________   PHYSICAL EXAM:  VITAL SIGNS: ED Triage Vitals  Enc Vitals Group     BP 05/22/15 1933 123/74 mmHg     Pulse Rate 05/22/15 1933 112     Resp 05/22/15 1933 18     Temp 05/22/15 1933 98.4 F (36.9 C)     Temp Source 05/22/15 1933 Oral     SpO2 05/22/15 1933 99 %     Weight 05/22/15 1933 152 lb (68.947 kg)     Height 05/22/15 1933 5\' 3"  (1.6 m)     Head Cir --      Peak Flow --      Pain Score 05/22/15 1933 3     Pain Loc --      Pain Edu? --      Excl. in [place]? --     Constitutional: Alert and oriented. Appears pale and uncomfortable but non-toxic Eyes: Conjunctivae are normal. PERRL. EOMI. Head: Atraumatic. Nose: No congestion/rhinnorhea. Mouth/Throat: Mucous membranes are moist.  Oropharynx non-erythematous. Neck: No stridor.  No meningeal signs.   Cardiovascular: Tachycardia, regular rhythm. Good peripheral circulation. Grossly normal heart sounds.   Respiratory: Normal respiratory effort.  No retractions. Lungs CTAB. Gastrointestinal: Soft and nontender. No distention.  Musculoskeletal: No lower extremity tenderness nor edema. No gross  deformities of extremities. Neurologic:  Normal speech and language. No gross focal neurologic deficits are appreciated.  Skin:  Skin is warm, dry and intact. No rash noted. Psychiatric: Mood and affect are normal. Speech and behavior are normal.  ____________________________________________   LABS (all labs ordered are listed, but only abnormal results are displayed)  Labs Reviewed  BASIC METABOLIC PANEL - Abnormal; Notable for the following:    Chloride 99 (*)    Glucose, Bld 120 (*)    Calcium 8.8 (*)    All other components within  normal limits  CBC - Abnormal; Notable for the following:    WBC 19.3 (*)    All other components within normal limits  TROPONIN I  LACTIC ACID, PLASMA  TROPONIN I  URINALYSIS COMPLETEWITH MICROSCOPIC ([facility] ONLY)   ____________________________________________  EKG  ED ECG REPORT I, Oluwatoyin Banales, the attending physician, personally viewed and interpreted this ECG.  Date: 05/22/2015 EKG Time: 19:28 Rate: 109 Rhythm: Sinus tachycardia QRS Axis: normal Intervals: normal ST/T Wave abnormalities: normal Conduction Disturbances: none Narrative Interpretation: unremarkable  ____________________________________________  RADIOLOGY   Dg Chest 2 View  05/22/2015  CLINICAL DATA:  Initial evaluation for acute chest pain. EXAM: CHEST  2 VIEW COMPARISON:  None. FINDINGS: Cardiac and mediastinal silhouettes are within normal limits. Lungs are normally inflated. No focal infiltrate, pulmonary edema, or pleural effusion. Minimal subsegmental atelectasis at the left lung base. No pneumothorax. No acute osseous abnormality.  Cholecystectomy clips noted. IMPRESSION: Minimal left basilar subsegmental atelectasis. No other active cardiopulmonary disease. Electronically Signed   By: Jeannine Boga M.D.   On: 05/22/2015 21:10   Ct Angio Chest Pe W/cm &/or Wo Cm  05/23/2015  CLINICAL DATA:  Pleuritic chest pain and tachycardia. Left-sided chest pain  radiating to the left shoulder. Nausea onset 8 hours prior. Shortness of breath. EXAM: CT ANGIOGRAPHY CHEST WITH CONTRAST TECHNIQUE: Multidetector CT imaging of the chest was performed using the standard protocol during bolus administration of intravenous contrast. Multiplanar CT image reconstructions and MIPs were obtained to evaluate the vascular anatomy. CONTRAST:  75 mL Isovue 370 IV COMPARISON:  Radiographs earlier this day. FINDINGS: There are no filling defects within the pulmonary arteries to suggest pulmonary embolus. Thoracic aorta is normal in caliber without dissection. No mediastinal or hilar adenopathy. No pericardial effusion. Mild bibasilar atelectasis, left greater than right. No consolidation. No suspicious nodule or mass. No pulmonary edema. No pleural effusion. No acute abnormality in the included upper abdomen. Cholecystectomy clips. The esophagus is decompressed. Bilateral subcentimeter thyroid nodules, largest in the right lobe measures 8 mm. Given subcentimeter size, most likely benign. There are no acute or suspicious osseous abnormalities. Review of the MIP images confirms the above findings. IMPRESSION: 1. No pulmonary embolus. 2. Mild dependent atelectasis, otherwise no acute intrathoracic process. Electronically Signed   By: Jeb Levering M.D.   On: 05/23/2015 01:29    ____________________________________________   PROCEDURES  Procedure(s) performed: None  Critical Care performed: No ____________________________________________   INITIAL IMPRESSION / ASSESSMENT AND PLAN / ED COURSE  Pertinent labs & imaging results that were available during my care of the patient were reviewed by me and considered in my medical decision making (see chart for details).  The atelectasis seen on chest x-ray may represent pneumonia.  However the patient is tachycardic and having pleuritic chest pain and the onset was rather acute, I am concerned that this may also represent development  of a new pulmonary embolism.  Her labs are notable mostly for significant leukocytosis which may again point towards pneumonia but could also be a stress reaction to a pulmonary embolism.  She is persistently tachycardic and her current heart rate is about 107 even after 1 L of fluids.  I will give another 1.25 L of fluid (meeting the 30 mL/kg recommendation for sepsis), check a lactic acid, repeat a troponin, and obtain a CT angiogram chest which should further evaluate for both pneumonia and rule out pulmonary embolism.  I explained the workup with the patient and family who understand and agree with the  plan.  I am going to go ahead and give a dose of ceftriaxone and azithromycin given the leukocytosis and probability that this may represent community-acquired pneumonia.  ----------------------------------------- 2:07 AM on 05/23/2015 -----------------------------------------  Chest CT was reassuring.  There is no evidence of pulmonary embolism and the atelectasis once again reviewed.  I believe the atelectasis may represent a community-acquired pneumonia given the patient's leukocytosis and persistent tachycardia after 2.25 L of IV fluid.  She is still tachycardia in the 1 teens.  She says that the chest pain feels little bit better and her second troponin was negative.  However given that she still appears ill although nontoxic, is still tachycardic, and she has a leukocytosis as well as atelectasis that I feel likely represents community acquired pneumonia, I feel that she meets sepsis criteria and I will admit her for further management.  I did give the patient the option of going home or staying and she feels more comfortable staying.  I spoke With the hospitalist who will talk with her and bring her into the hospital.  ____________________________________________  FINAL CLINICAL IMPRESSION(S) / ED DIAGNOSES  Final diagnoses:  SIRS (systemic inflammatory response syndrome) ([place])  CAP (community  acquired pneumonia)  Tachycardia  Leukocytosis  Sepsis, due to unspecified organism [facility])     MEDICATIONS GIVEN DURING THIS VISIT:  Medications  ondansetron (ZOFRAN-ODT) 4 MG disintegrating tablet (not administered)  ondansetron (ZOFRAN-ODT) disintegrating tablet 4 mg (4 mg Oral Given 05/22/15 1948)  sodium chloride 0.9 % bolus 1,000 mL (0 mLs Intravenous Stopped 05/23/15 0055)  sodium chloride 0.9 % bolus 1,250 mL (1,250 mLs Intravenous New Bag/Given 05/23/15 0015)  morphine 4 MG/ML injection 4 mg (4 mg Intravenous Given 05/23/15 0020)  ondansetron (ZOFRAN) injection 4 mg (4 mg Intravenous Given 05/23/15 0018)  cefTRIAXone (ROCEPHIN) 1 g in dextrose 5 % 50 mL IVPB (0 g Intravenous Stopped 05/23/15 0053)  azithromycin (ZITHROMAX) 500 mg in dextrose 5 % 250 mL IVPB (500 mg Intravenous New Bag/Given 05/23/15 0104)  iopamidol (ISOVUE-370) 76 % injection 75 mL (75 mLs Intravenous Contrast Given 05/22/15 2355)     NEW OUTPATIENT MEDICATIONS STARTED DURING THIS VISIT:  New Prescriptions   No medications on file      Note:  This document was prepared using Dragon voice recognition software and may include unintentional dictation errors.   Hinda Kehr, MD 05/23/15 [phone]

## 2015-05-22 NOTE — ED Notes (Signed)
Patient with complaint of left side chest pain radiating to left shoulder and nausea that started around 15:00 today.

## 2015-05-22 NOTE — ED Notes (Signed)
Report from [place], [place]

## 2015-05-23 ENCOUNTER — Encounter: Payer: Self-pay | Admitting: Internal Medicine

## 2015-05-23 DIAGNOSIS — J189 Pneumonia, unspecified organism: Secondary | ICD-10-CM | POA: Diagnosis not present

## 2015-05-23 LAB — URINALYSIS COMPLETE WITH MICROSCOPIC (ARMC ONLY)
Bacteria, UA: NONE SEEN
Bilirubin Urine: NEGATIVE
Glucose, UA: NEGATIVE mg/dL
Hgb urine dipstick: NEGATIVE
Ketones, ur: NEGATIVE mg/dL
Leukocytes, UA: NEGATIVE
Nitrite: NEGATIVE
Protein, ur: NEGATIVE mg/dL
RBC / HPF: NONE SEEN RBC/hpf (ref 0–5)
Specific Gravity, Urine: 1.025 (ref 1.005–1.030)
Squamous Epithelial / LPF: NONE SEEN
pH: 5 (ref 5.0–8.0)

## 2015-05-23 LAB — HEMOGLOBIN A1C: Hgb A1c MFr Bld: 5.7 % (ref 4.0–6.0)

## 2015-05-23 LAB — LACTIC ACID, PLASMA: Lactic Acid, Venous: 1.1 mmol/L (ref 0.5–2.0)

## 2015-05-23 LAB — TROPONIN I

## 2015-05-23 LAB — TSH: TSH: 0.962 u[IU]/mL (ref 0.350–4.500)

## 2015-05-23 MED ORDER — LEVOFLOXACIN 500 MG PO TABS
500.0000 mg | ORAL_TABLET | Freq: Every day | ORAL | Status: DC
  Administered 2015-05-23: 500 mg via ORAL
  Filled 2015-05-23: qty 1

## 2015-05-23 MED ORDER — ACETAMINOPHEN 325 MG PO TABS
650.0000 mg | ORAL_TABLET | Freq: Four times a day (QID) | ORAL | Status: DC | PRN
  Administered 2015-05-23 – 2015-05-24 (×4): 650 mg via ORAL
  Filled 2015-05-23 (×3): qty 2

## 2015-05-23 MED ORDER — ACETAMINOPHEN 650 MG RE SUPP: 650.0000 mg | Freq: Four times a day (QID) | RECTAL | Status: DC | PRN

## 2015-05-23 MED ORDER — GABAPENTIN 400 MG PO CAPS
400.0000 mg | ORAL_CAPSULE | Freq: Two times a day (BID) | ORAL | Status: DC | PRN
  Administered 2015-05-24: 400 mg via ORAL
  Filled 2015-05-23 (×2): qty 1

## 2015-05-23 MED ORDER — TRAZODONE HCL 50 MG PO TABS
50.0000 mg | ORAL_TABLET | Freq: Every evening | ORAL | Status: DC | PRN
Start: 2015-05-23 — End: 2015-05-24
  Administered 2015-05-23: 100 mg via ORAL
  Filled 2015-05-23: qty 2

## 2015-05-23 MED ORDER — PRAMIPEXOLE DIHYDROCHLORIDE 0.25 MG PO TABS
0.2500 mg | ORAL_TABLET | Freq: Every day | ORAL | Status: DC
  Administered 2015-05-23: 0.25 mg via ORAL
  Filled 2015-05-23: qty 1

## 2015-05-23 MED ORDER — DOCUSATE SODIUM 100 MG PO CAPS
100.0000 mg | ORAL_CAPSULE | Freq: Two times a day (BID) | ORAL | Status: DC
  Administered 2015-05-23 – 2015-05-24 (×3): 100 mg via ORAL
  Filled 2015-05-23 (×3): qty 1

## 2015-05-23 MED ORDER — ACETAMINOPHEN 325 MG PO TABS
ORAL_TABLET | ORAL | Status: AC
  Administered 2015-05-23: 650 mg via ORAL
  Filled 2015-05-23: qty 2

## 2015-05-23 MED ORDER — SODIUM CHLORIDE 0.9 % IV SOLN
INTRAVENOUS | Status: DC
  Administered 2015-05-23: 06:00:00 via INTRAVENOUS

## 2015-05-23 MED ORDER — MORPHINE SULFATE (PF) 2 MG/ML IV SOLN: 1.0000 mg | INTRAVENOUS | Status: DC | PRN

## 2015-05-23 MED ORDER — ENOXAPARIN SODIUM 40 MG/0.4ML ~~LOC~~ SOLN
40.0000 mg | SUBCUTANEOUS | Status: DC
  Administered 2015-05-23: 40 mg via SUBCUTANEOUS
  Filled 2015-05-23: qty 0.4

## 2015-05-23 MED ORDER — ONDANSETRON HCL 4 MG/2ML IJ SOLN: 4.0000 mg | Freq: Four times a day (QID) | INTRAMUSCULAR | Status: DC | PRN

## 2015-05-23 MED ORDER — ONDANSETRON HCL 4 MG PO TABS: 4.0000 mg | ORAL_TABLET | Freq: Four times a day (QID) | ORAL | Status: DC | PRN

## 2015-05-23 NOTE — ED Notes (Signed)
Pt updated on admission process. Pt verbalizes understanding.  

## 2015-05-23 NOTE — Progress Notes (Addendum)
VS and lab reviewed. PE done bedside.  1. PNA with sepsis:  She was treated with ceftriaxone and azithromycin in ED and changed to oral Levaquin. Treated with fluid resuscitation. Follow CBC and blood cultures.  2. Noncardiac chest pain, musculoskeletal and/or chest tightness associated with bronchitis.  Discussed with pt, her husband and mother.  Time: 32 min.

## 2015-05-23 NOTE — ED Notes (Signed)
Pt provided with po fluids per request with md consent.

## 2015-05-23 NOTE — ED Notes (Signed)
Pt states headache is "beginning to return, but it's not bad." pt and spouse informed of admission delay and verbalize understanding.

## 2015-05-23 NOTE — H&P (Signed)
Martha Whitney is an 49 y.o. female.   Chief Complaint: Chest pain HPI: The patient with past medical history of kidney stones presents to the emergency department following the acute onset of chest pain. The patient states that she had been feeling unwell earlier this afternoon and late down to take a nap. She awoke and began ironing her closed. She is right-handed but felt sharp stabbing pain in her left chest and shoulder. The pain lasted only briefly and then became a dull ache that waxes and wanes in severity. She states she's had this pain intermittently since then and that it is nagging at this time in the emergency department. She underwent a CTA of the chest in the emergency department that ruled out pulmonary embolism. It did reveal some atelectasis at the bases of her lungs bilaterally. Cardiac enzymes were negative and the patient has had no EKG changes. She admits to nausea but denies vomiting. Her husband was sick 4 days ago with nausea and diarrhea as well as subjective fevers and chills. The patient admits to subjective fevers as well. Despite fluid resuscitation emergency department the patient had persistent tachycardia as well as subjective shortness of breath. She was treated empirically for community-acquired pneumonia prior to the urgency department staff calling the hospitalist service for further evaluation.  Past Medical History  Diagnosis Date  . Restless leg syndrome 2007  . Endometriosis determined by laparoscopy   . Chronic kidney disease     kidney stones    Past Surgical History  Procedure Laterality Date  . Cholecystectomy    . Tonsillectomy    . Carpal tunnel release Left 2003  . Laparoscopic total hysterectomy    . Cystectomy N/A 1969    cyst removed from throat  . Leep N/A 1995  . Laparoscopy with tubal ligation    . Laparoscopic salpingo oopherectomy Bilateral 06/22/2014    Procedure: LAPAROSCOPIC SALPINGO OOPHORECTOMY;  Surgeon: Charlie Pickens, MD;   Location: [facility] ORS;  Service: Gynecology;  Laterality: Bilateral;  . Ganglion cyst excision Left 10/21/2014    Procedure: REMOVAL GANGLION OF WRIST;  Surgeon: Michael Menz, MD;  Location: [facility] ORS;  Service: Orthopedics;  Laterality: Left;  . Abdominal hysterectomy      Family History  Problem Relation Age of Onset  . Heart disease Father   . Heart disease Maternal Grandmother   . Heart disease Paternal Grandmother    Social History:  reports that she has never smoked. She does not have any smokeless tobacco history on file. She reports that she drinks alcohol. She reports that she does not use illicit drugs.  Allergies:  Allergies  Allergen Reactions  . Barbital Hives  . Flu Virus Vaccine Hives  . Other Itching    SURGICAL GLUE-REDNESS AND ITCHING  . Pneumococcal Vaccines Swelling    Redness, swollen and hot to touch  . Sulfa Antibiotics Hives    Medications Prior to Admission  Medication Sig Dispense Refill  . gabapentin (NEURONTIN) 400 MG capsule Take 400 mg by mouth 2 (two) times daily as needed (restless legs).     . pramipexole (MIRAPEX) 0.25 MG tablet Take 0.25 mg by mouth at bedtime.    . traZODone (DESYREL) 50 MG tablet Take 50-100 mg by mouth at bedtime as needed for sleep (restless leg).    . docusate sodium (COLACE) 100 MG capsule Take 1 capsule (100 mg total) by mouth 2 (two) times daily as needed for mild constipation. (Patient not taking: Reported on 10/13/2014) 60 capsule   0  . HYDROcodone-acetaminophen (NORCO) 5-325 MG tablet Take 1 tablet by mouth every 6 (six) hours as needed for moderate pain. (Patient not taking: Reported on 05/22/2015) 30 tablet 0  . ibuprofen (MOTRIN IB) 200 MG tablet Take 1 tablet (200 mg total) by mouth every 6 (six) hours as needed. (Patient not taking: Reported on 10/13/2014) 30 tablet 1  . meloxicam (MOBIC) 7.5 MG tablet Take 7.5 mg by mouth daily as needed for pain (tennis elbow). Reported on 05/23/2015    . oxyCODONE-acetaminophen  (PERCOCET/ROXICET) 5-325 MG per tablet Take 1-2 tablets by mouth every 4 (four) hours as needed (moderate to severe pain (when tolerating fluids)). (Patient not taking: Reported on 10/13/2014) 10 tablet 0  . oxyCODONE-acetaminophen (ROXICET) 5-325 MG per tablet Take 1 tablet by mouth every 6 (six) hours as needed for severe pain. (Patient not taking: Reported on 05/22/2015) 12 tablet 0    Results for orders placed or performed during the hospital encounter of 05/22/15 (from the past 48 hour(s))  Basic metabolic panel     Status: Abnormal   Collection Time: 05/22/15  7:36 PM  Result Value Ref Range   Sodium 136 135 - 145 mmol/L   Potassium 3.7 3.5 - 5.1 mmol/L   Chloride 99 (L) 101 - 111 mmol/L   CO2 29 22 - 32 mmol/L   Glucose, Bld 120 (H) 65 - 99 mg/dL   BUN 11 6 - 20 mg/dL   Creatinine, Ser 0.84 0.44 - 1.00 mg/dL   Calcium 8.8 (L) 8.9 - 10.3 mg/dL   GFR calc non Af Amer >60 >60 mL/min   GFR calc Af Amer >60 >60 mL/min    Comment: (NOTE) The eGFR has been calculated using the CKD EPI equation. This calculation has not been validated in all clinical situations. eGFR's persistently <60 mL/min signify possible Chronic Kidney Disease.    Anion gap 8 5 - 15  CBC     Status: Abnormal   Collection Time: 05/22/15  7:36 PM  Result Value Ref Range   WBC 19.3 (H) 3.6 - 11.0 K/uL   RBC 4.79 3.80 - 5.20 MIL/uL   Hemoglobin 13.8 12.0 - 16.0 g/dL   HCT 41.3 35.0 - 47.0 %   MCV 86.2 80.0 - 100.0 fL   MCH 28.9 26.0 - 34.0 pg   MCHC 33.5 32.0 - 36.0 g/dL   RDW 12.2 11.5 - 14.5 %   Platelets 235 150 - 440 K/uL  Troponin I     Status: None   Collection Time: 05/22/15  7:36 PM  Result Value Ref Range   Troponin I <0.03 <0.031 ng/mL    Comment:        NO INDICATION OF MYOCARDIAL INJURY.   Lactic acid, plasma     Status: None   Collection Time: 05/22/15 11:33 PM  Result Value Ref Range   Lactic Acid, Venous 1.1 0.5 - 2.0 mmol/L  Troponin I     Status: None   Collection Time: 05/22/15 11:33  PM  Result Value Ref Range   Troponin I <0.03 <0.031 ng/mL    Comment:        NO INDICATION OF MYOCARDIAL INJURY.    Dg Chest 2 View  05/22/2015  CLINICAL DATA:  Initial evaluation for acute chest pain. EXAM: CHEST  2 VIEW COMPARISON:  None. FINDINGS: Cardiac and mediastinal silhouettes are within normal limits. Lungs are normally inflated. No focal infiltrate, pulmonary edema, or pleural effusion. Minimal subsegmental atelectasis at the left lung base.   No pneumothorax. No acute osseous abnormality.  Cholecystectomy clips noted. IMPRESSION: Minimal left basilar subsegmental atelectasis. No other active cardiopulmonary disease. Electronically Signed   By: Jeannine Boga M.D.   On: 05/22/2015 21:10   Ct Angio Chest Pe W/cm &/or Wo Cm  05/23/2015  CLINICAL DATA:  Pleuritic chest pain and tachycardia. Left-sided chest pain radiating to the left shoulder. Nausea onset 8 hours prior. Shortness of breath. EXAM: CT ANGIOGRAPHY CHEST WITH CONTRAST TECHNIQUE: Multidetector CT imaging of the chest was performed using the standard protocol during bolus administration of intravenous contrast. Multiplanar CT image reconstructions and MIPs were obtained to evaluate the vascular anatomy. CONTRAST:  75 mL Isovue 370 IV COMPARISON:  Radiographs earlier this day. FINDINGS: There are no filling defects within the pulmonary arteries to suggest pulmonary embolus. Thoracic aorta is normal in caliber without dissection. No mediastinal or hilar adenopathy. No pericardial effusion. Mild bibasilar atelectasis, left greater than right. No consolidation. No suspicious nodule or mass. No pulmonary edema. No pleural effusion. No acute abnormality in the included upper abdomen. Cholecystectomy clips. The esophagus is decompressed. Bilateral subcentimeter thyroid nodules, largest in the right lobe measures 8 mm. Given subcentimeter size, most likely benign. There are no acute or suspicious osseous abnormalities. Review of the MIP  images confirms the above findings. IMPRESSION: 1. No pulmonary embolus. 2. Mild dependent atelectasis, otherwise no acute intrathoracic process. Electronically Signed   By: Jeb Levering M.D.   On: 05/23/2015 01:29    Review of Systems  Constitutional: Positive for fever (Subjective), chills and malaise/fatigue.  HENT: Negative for sore throat and tinnitus.   Eyes: Negative for blurred vision and redness.  Respiratory: Negative for cough and shortness of breath.   Cardiovascular: Positive for chest pain. Negative for palpitations, orthopnea and PND.  Gastrointestinal: Positive for nausea. Negative for vomiting, abdominal pain and diarrhea.  Genitourinary: Negative for dysuria, urgency and frequency.  Musculoskeletal: Negative for myalgias and joint pain.  Skin: Negative for rash.       No lesions  Neurological: Negative for speech change, focal weakness and weakness.  Endo/Heme/Allergies: Does not bruise/bleed easily.       No temperature intolerance  Psychiatric/Behavioral: Negative for depression and suicidal ideas.    Blood pressure 104/50, pulse 96, temperature 99.3 F (37.4 C), temperature source Oral, resp. rate 18, height 5' 3" (1.6 m), weight 68.947 kg (152 lb), SpO2 97 %. Physical Exam  Vitals reviewed. Constitutional: She is oriented to person, place, and time. She appears well-developed and well-nourished.  HENT:  Head: Normocephalic and atraumatic.  Mouth/Throat: Oropharynx is clear and moist.  Eyes: Conjunctivae and EOM are normal. Pupils are equal, round, and reactive to light. No scleral icterus.  Neck: Normal range of motion. Neck supple. No JVD present. No tracheal deviation present. No thyromegaly present.  Cardiovascular: Normal rate, regular rhythm and normal heart sounds.  Exam reveals no gallop and no friction rub.   No murmur heard. Respiratory: Effort normal and breath sounds normal.  GI: Soft. Bowel sounds are normal. She exhibits no distension. There is  no tenderness.  Genitourinary:  Deferred  Musculoskeletal: Normal range of motion. She exhibits no edema.  Lymphadenopathy:    She has no cervical adenopathy.  Neurological: She is alert and oriented to person, place, and time. No cranial nerve deficit. She exhibits normal muscle tone.  Skin: Skin is warm and dry. No rash noted. No erythema. There is pallor.  Psychiatric: She has a normal mood and affect. Her behavior is normal.  Judgment and thought content normal.     Assessment/Plan This is a 49-year-old female admitted for community-acquired pneumonia. 1. Community-acquired pneumonia: The patient is received ceftriaxone and azithromycin in the emergency department. She meets criteria for sepsis via tachycardia and leukocytosis. She is hemodynamically stable. I will continue antibiotic therapy with oral Levaquin. 2. Sepsis: Continue fluid resuscitation. Follow blood cultures for growth and sensitivities 3. Noncardiac chest pain: There is no indication of myocardial ischemia at this time. I place the patient on telemetry. Differential diagnosis of chest pain includes musculoskeletal and/or chest tightness associated with possible pneumonia. 4. DVT prophylaxis: Lovenox 5. GI prophylaxis: None The patient is a full code. Time spent on admission orders and patient care approximately 45 minutes  Diamond,  Michael S, MD 05/23/2015, 5:48 AM    

## 2015-05-23 NOTE — ED Notes (Signed)
Dr. Diamond in to see pt.  

## 2015-05-24 DIAGNOSIS — J189 Pneumonia, unspecified organism: Secondary | ICD-10-CM | POA: Diagnosis not present

## 2015-05-24 LAB — CBC
HCT: 33.7 % — ABNORMAL LOW (ref 35.0–47.0)
HEMOGLOBIN: 11.6 g/dL — AB (ref 12.0–16.0)
MCH: 29.3 pg (ref 26.0–34.0)
MCHC: 34.5 g/dL (ref 32.0–36.0)
MCV: 84.9 fL (ref 80.0–100.0)
Platelets: 189 10*3/uL (ref 150–440)
RBC: 3.96 MIL/uL (ref 3.80–5.20)
RDW: 12.5 % (ref 11.5–14.5)
WBC: 7.8 10*3/uL (ref 3.6–11.0)

## 2015-05-24 MED ORDER — LEVOFLOXACIN 500 MG PO TABS: 500.0000 mg | ORAL_TABLET | Freq: Every day | ORAL | Status: DC

## 2015-05-24 MED ORDER — GUAIFENESIN 100 MG/5ML PO SOLN: 5.0000 mL | ORAL | Status: DC | PRN

## 2015-05-24 NOTE — Discharge Summary (Signed)
[place] at [place] NAME: Martha Whitney    MR#:  [id number]  DATE OF BIRTH:  [birth year]  DATE OF ADMISSION:  05/22/2015 ADMITTING PHYSICIAN: Harrie Foreman, MD  DATE OF DISCHARGE: 05/24/2015 11:55 AM  PRIMARY CARE PHYSICIAN: Kirk Ruths., MD    ADMISSION DIAGNOSIS:  Leukocytosis [D72.829] Tachycardia [R00.0] CAP (community acquired pneumonia) [J18.9] SIRS (systemic inflammatory response syndrome) (HCC) [R65.10] Sepsis, due to unspecified organism ([place]) [A41.9]   DISCHARGE DIAGNOSIS:   PNA with sepsis  SECONDARY DIAGNOSIS:   Past Medical History  Diagnosis Date  . Restless leg syndrome 2007  . Endometriosis determined by laparoscopy   . Chronic kidney disease     kidney stones    HOSPITAL COURSE:   1. PNA with sepsis:  She was treated with ceftriaxone and azithromycin in ED and changed to oral Levaquin. Treated with fluid resuscitation. Leukocytosis improved.  2. Noncardiac chest pain, musculoskeletal and/or chest tightness associated with pneumonia. improved.  DISCHARGE CONDITIONS:   Stable, discharged to home today.  CONSULTS OBTAINED:     DRUG ALLERGIES:   Allergies  Allergen Reactions  . Barbital Hives  . Flu Virus Vaccine Hives  . Other Itching    SURGICAL GLUE-REDNESS AND ITCHING  . Pneumococcal Vaccines Swelling    Redness, swollen and hot to touch  . Sulfa Antibiotics Hives    DISCHARGE MEDICATIONS:   Discharge Medication List as of 05/24/2015 10:45 AM    START taking these medications   Details  guaiFENesin (ROBITUSSIN) 100 MG/5ML SOLN Take 5 mLs (100 mg total) by mouth every 4 (four) hours as needed for cough or to loosen phlegm., Starting 05/24/2015, Until Discontinued, Print    levofloxacin (LEVAQUIN) 500 MG tablet Take 1 tablet (500 mg total) by mouth daily., Starting 05/24/2015, Until Discontinued, Print      CONTINUE these medications which have NOT CHANGED   Details   gabapentin (NEURONTIN) 400 MG capsule Take 400 mg by mouth 2 (two) times daily as needed (restless legs). , Until Discontinued, Historical Med    pramipexole (MIRAPEX) 0.25 MG tablet Take 0.25 mg by mouth at bedtime., Until Discontinued, Historical Med    traZODone (DESYREL) 50 MG tablet Take 50-100 mg by mouth at bedtime as needed for sleep (restless leg)., Until Discontinued, Historical Med      STOP taking these medications     docusate sodium (COLACE) 100 MG capsule      HYDROcodone-acetaminophen (NORCO) 5-325 MG tablet      ibuprofen (MOTRIN IB) 200 MG tablet      meloxicam (MOBIC) 7.5 MG tablet      oxyCODONE-acetaminophen (PERCOCET/ROXICET) 5-325 MG per tablet      oxyCODONE-acetaminophen (ROXICET) 5-325 MG per tablet          DISCHARGE INSTRUCTIONS:   If you experience worsening of your admission symptoms, develop shortness of breath, life threatening emergency, suicidal or homicidal thoughts you must seek medical attention immediately by calling 911 or calling your MD immediately  if symptoms less severe.  You Must read complete instructions/literature along with all the possible adverse reactions/side effects for all the Medicines you take and that have been prescribed to you. Take any new Medicines after you have completely understood and accept all the possible adverse reactions/side effects.   Please note  You were cared for by a hospitalist during your hospital stay. If you have any questions about your discharge medications or the care you received while you were in the  hospital after you are discharged, you can call the unit and asked to speak with the hospitalist on call if the hospitalist that took care of you is not available. Once you are discharged, your primary care physician will handle any further medical issues. Please note that NO REFILLS for any discharge medications will be authorized once you are discharged, as it is imperative that you return to your  primary care physician (or establish a relationship with a primary care physician if you do not have one) for your aftercare needs so that they can reassess your need for medications and monitor your lab values.    Today   SUBJECTIVE    No complaint.  VITAL SIGNS:  Blood pressure 108/62, pulse 73, temperature 98.2 F (36.8 C), temperature source Oral, resp. rate 18, height 5\' 3"  (1.6 m), weight 71.7 kg (158 lb 1.1 oz), SpO2 96 %.  I/O:   Intake/Output Summary (Last 24 hours) at 05/24/15 1651 Last data filed at 05/24/15 0900  Gross per 24 hour  Intake    960 ml  Output      0 ml  Net    960 ml    PHYSICAL EXAMINATION:  GENERAL:  50 y.o.-year-old patient lying in the bed with no acute distress.  EYES: Pupils equal, round, reactive to light and accommodation. No scleral icterus. Extraocular muscles intact.  HEENT: Head atraumatic, normocephalic. Oropharynx and nasopharynx clear.  NECK:  Supple, no jugular venous distention. No thyroid enlargement, no tenderness.  LUNGS: Normal breath sounds bilaterally, no wheezing, rales,rhonchi or crepitation. No use of accessory muscles of respiration.  CARDIOVASCULAR: S1, S2 normal. No murmurs, rubs, or gallops.  ABDOMEN: Soft, non-tender, non-distended. Bowel sounds present. No organomegaly or mass.  EXTREMITIES: No pedal edema, cyanosis, or clubbing.  NEUROLOGIC: Cranial nerves II through XII are intact. Muscle strength 5/5 in all extremities. Sensation intact. Gait not checked.  PSYCHIATRIC: The patient is alert and oriented x 3.  SKIN: No obvious rash, lesion, or ulcer.   DATA REVIEW:   CBC  Recent Labs Lab 05/24/15 0550  WBC 7.8  HGB 11.6*  HCT 33.7*  PLT 189    Chemistries   Recent Labs Lab 05/22/15 1936  NA 136  K 3.7  CL 99*  CO2 29  GLUCOSE 120*  BUN 11  CREATININE 0.84  CALCIUM 8.8*    Cardiac Enzymes  Recent Labs Lab 05/22/15 2333  TROPONINI <0.03    Microbiology Results  No results found for this  or any previous visit.  RADIOLOGY:  Dg Chest 2 View  05/22/2015  CLINICAL DATA:  Initial evaluation for acute chest pain. EXAM: CHEST  2 VIEW COMPARISON:  None. FINDINGS: Cardiac and mediastinal silhouettes are within normal limits. Lungs are normally inflated. No focal infiltrate, pulmonary edema, or pleural effusion. Minimal subsegmental atelectasis at the left lung base. No pneumothorax. No acute osseous abnormality.  Cholecystectomy clips noted. IMPRESSION: Minimal left basilar subsegmental atelectasis. No other active cardiopulmonary disease. Electronically Signed   By: Jeannine Boga M.D.   On: 05/22/2015 21:10   Ct Angio Chest Pe W/cm &/or Wo Cm  05/23/2015  CLINICAL DATA:  Pleuritic chest pain and tachycardia. Left-sided chest pain radiating to the left shoulder. Nausea onset 8 hours prior. Shortness of breath. EXAM: CT ANGIOGRAPHY CHEST WITH CONTRAST TECHNIQUE: Multidetector CT imaging of the chest was performed using the standard protocol during bolus administration of intravenous contrast. Multiplanar CT image reconstructions and MIPs were obtained to evaluate the vascular anatomy. CONTRAST:  75 mL Isovue 370 IV COMPARISON:  Radiographs earlier this day. FINDINGS: There are no filling defects within the pulmonary arteries to suggest pulmonary embolus. Thoracic aorta is normal in caliber without dissection. No mediastinal or hilar adenopathy. No pericardial effusion. Mild bibasilar atelectasis, left greater than right. No consolidation. No suspicious nodule or mass. No pulmonary edema. No pleural effusion. No acute abnormality in the included upper abdomen. Cholecystectomy clips. The esophagus is decompressed. Bilateral subcentimeter thyroid nodules, largest in the right lobe measures 8 mm. Given subcentimeter size, most likely benign. There are no acute or suspicious osseous abnormalities. Review of the MIP images confirms the above findings. IMPRESSION: 1. No pulmonary embolus. 2. Mild  dependent atelectasis, otherwise no acute intrathoracic process. Electronically Signed   By: Jeb Levering M.D.   On: 05/23/2015 01:29        Management plans discussed with the patient, her husband and they are in agreement.  CODE STATUS:  Code Status History    Date Active Date Inactive Code Status Order ID Comments User Context   05/23/2015  4:44 AM 05/24/2015  2:55 PM Full Code [id number]  Harrie Foreman, MD ED   10/21/2014  3:55 PM 10/21/2014  7:38 PM Full Code [id number]  Hessie Knows, MD Inpatient   06/22/2014  9:26 AM 06/22/2014  3:02 PM Full Code [id number]  Aletha Halim, MD Inpatient      TOTAL TIME TAKING CARE OF THIS PATIENT: 23 minutes.    Demetrios Loll M.D on 05/24/2015 at 4:51 PM  Between 7am to 6pm - Pager - [phone]  After 6pm go to [email or web address] - password [email or web address] [facility]  Office  [phone]  CC: Primary care physician; Kirk Ruths., MD

## 2015-05-24 NOTE — Discharge Planning (Addendum)
Pt IV removed.  Pt DC papers given, explained and educated.  Pt told of suggested FU appt. And given scripts and work note.  RN assessment and VS revealed stability for DC to home.  Pt will be wheeled  To front and family transporting home via car.

## 2015-05-24 NOTE — Progress Notes (Signed)
[place] at [place] was admitted to the Hospital on 05/22/2015 and Discharged  05/24/2015 and should be excused from work/school   for 6 days starting 05/22/2015 , may return to work/school without any restrictions.  Demetrios Loll MD.  Demetrios Loll M.D on 05/24/2015,at 10:40 AM  [place] at [facility]  [phone]

## 2015-05-24 NOTE — Discharge Instructions (Signed)
Heart healthy diet. °Activity as tolerated. °

## 2015-07-21 ENCOUNTER — Encounter: Payer: Self-pay | Admitting: Occupational Therapy

## 2015-07-21 ENCOUNTER — Ambulatory Visit: Payer: BC Managed Care – PPO | Attending: Orthopedic Surgery | Admitting: Occupational Therapy

## 2015-07-21 DIAGNOSIS — M6281 Muscle weakness (generalized): Secondary | ICD-10-CM | POA: Insufficient documentation

## 2015-07-21 DIAGNOSIS — M79601 Pain in right arm: Secondary | ICD-10-CM | POA: Insufficient documentation

## 2015-07-21 DIAGNOSIS — M25641 Stiffness of right hand, not elsewhere classified: Secondary | ICD-10-CM | POA: Insufficient documentation

## 2015-07-21 NOTE — Patient Instructions (Signed)
Contrast  To hand to elbow to decrease pain and inflammation  Await family MD appt - ? appt with Dr Jefm Bryant

## 2015-07-21 NOTE — Therapy (Signed)
[facility] [address], [place], [address] Phone: [phone]   Fax:  [phone]  Occupational Therapy Treatment  Patient Details  Name: Martha Whitney MRN: [id number] Date of Birth: 02/21/1965 Referring Provider: Reche Dixon  Encounter Date: 07/21/2015      OT End of Session - 07/21/15 2120    Visit Number 1   Number of Visits 8   Date for OT Re-Evaluation 08/18/15   OT Start Time 1635   OT Stop Time 1738   OT Time Calculation (min) 63 min   Activity Tolerance Patient tolerated treatment well;Patient limited by pain   Behavior During Therapy [facility] for tasks assessed/performed      Past Medical History  Diagnosis Date  . Restless leg syndrome 2007  . Endometriosis determined by laparoscopy   . Chronic kidney disease     kidney stones    Past Surgical History  Procedure Laterality Date  . Cholecystectomy    . Tonsillectomy    . Carpal tunnel release Left 2003  . Laparoscopic total hysterectomy    . Cystectomy N/A 1969    cyst removed from throat  . Leep N/A 1995  . Laparoscopy with tubal ligation    . Laparoscopic salpingo oopherectomy Bilateral 06/22/2014    Procedure: LAPAROSCOPIC SALPINGO OOPHORECTOMY;  Surgeon: Aletha Halim, MD;  Location: [facility] ORS;  Service: Gynecology;  Laterality: Bilateral;  . Ganglion cyst excision Left 10/21/2014    Procedure: REMOVAL GANGLION OF WRIST;  Surgeon: Hessie Knows, MD;  Location: [facility] ORS;  Service: Orthopedics;  Laterality: Left;  . Abdominal hysterectomy      There were no vitals filed for this visit.      Subjective Assessment - 07/21/15 2107    Subjective  Had pain since January  - did not do more activities than normal , did not had injury - seen Greig Castilla and had shot Jan, and then March again - and then earlier this month  Predisone - did not help for the pain  - I have  appt with my   primary MD tomorrow    Patient Stated Goals I want  the pain better - it hurts and I cannot use my R hand , typing , writing is hard -    Currently in Pain? 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, filing , boxes - R hand dominant - yard work , Biomedical engineer , reading,    Pharmacist, community Grip (lbs) 10  hand and forearm pain    Right Hand Lateral Pinch 6 lbs  hand and wrist pain    Right Hand 3 Point Pinch 6 lbs  hand and wrist pain    Left Hand Grip (lbs) 42   Left Hand Lateral Pinch 16 lbs   Left Hand 3 Point Pinch  14 lbs        Contrast done 11 min to elbow to hand on R  Pt to do at home   Ionto with dexamethasone  - medium patch over lateral epicondyle - 2.0 current - 19 min  Skin check done prior and end                   OT Education - 07/21/15 2119    Education provided Yes   Education Details findings of eval - and home program/plan   Person(s) Educated Patient   Methods Explanation;Demonstration;Tactile cues;Verbal cues   Comprehension Verbal cues required;Returned demonstration;Verbalized understanding          OT Short Term Goals - 07/21/15 2128    OT SHORT TERM GOAL #1   Title Pain in R hand and forearm  on PRWHE improve by at least 20 points    Baseline Pain at eval on PRWHE 46/50   Time 3   Period Weeks   Status New           OT Long Term Goals - 07/21/15 2129    OT LONG TERM GOAL #1   Title Function in R hand and forearm on PRHWE  improve with 20 points    Baseline at eval 45/50  for function on  PRWHE   Time 4   Period Weeks   Status New   OT LONG TERM GOAL #2   Title Establish goals for ROM and grip after further assessment by famly MD    Time 2   Period Weeks   Status New               Plan - 07/21/15 2121    Clinical Impression Statement Pt present with diagnosis of lateral epicondlylitis - pt do have tenderness , pain and edema over lateral epidondyle , no tenderness over medial epicondlye - but pt negative for 3rd digit resistance ,  wrist extention with resistance  pain wrist and forearm - but full AROM  in wrist flexion and extnetion - with  elbow extended in pronation , and  supination - grip  with extended  elbow  was 15 lbs - more than elbow to side -  during prehension  pt  more pain over digits and wrist -   pt appear to have nodule  over DIP - pt do report she had  RA positive  factors about 10 yrs ago -  see by Dr Lindon Romp - and has famlily  history  of RA -  pt do appear to not only have lateral epicondylitis - did do ionto with dexamethasone this date - pt to see   her primary MD tomorrow -   with possibly making appt  with rheumathologist   Rehab Potential Fair   OT Frequency 2x / week   OT Duration 4 weeks   OT Treatment/Interventions Self-care/ADL training;Contrast Bath;Iontophoresis;Therapeutic exercise;Patient/family education;Splinting;Manual Therapy;Parrafin;DME and/or AE instruction;Passive range of motion   Plan Pt to phone  after MD appt  - await results and if she needs to see rheumologist    OT Home Exercise Plan contrast -    Consulted and Agree with Plan of Care Patient      Patient will benefit from skilled therapeutic intervention in order to improve the following deficits and impairments:  Decreased coordination, Decreased range of motion, Impaired flexibility, Increased edema, Pain, Impaired UE functional use, Decreased strength  Visit Diagnosis: Pain In Right Arm - Plan: Ot plan of  care cert/re-cert  Stiffness of right hand, not  elsewhere classified - Plan: Ot plan of care cert/re-cert  Muscle weakness (generalized) - Plan: Ot plan of care cert/re-cert    Problem List Patient Active Problem List   Diagnosis Date Noted  . CAP (community acquired pneumonia) 05/23/2015    Rosalyn Gess OTR/L,CLT  07/21/2015, 9:38 PM  [facility] [address] [place], [address] Phone: [phone]   Fax:  [phone]  Name: Martha Whitney MRN: [id number] Date of Birth: 1965-01-06

## 2015-08-18 ENCOUNTER — Encounter: Payer: BC Managed Care – PPO | Admitting: Occupational Therapy

## 2015-08-25 ENCOUNTER — Ambulatory Visit: Payer: BC Managed Care – PPO | Attending: Orthopedic Surgery | Admitting: Occupational Therapy

## 2015-08-25 DIAGNOSIS — M6281 Muscle weakness (generalized): Secondary | ICD-10-CM | POA: Diagnosis present

## 2015-08-25 DIAGNOSIS — M79601 Pain in right arm: Secondary | ICD-10-CM | POA: Diagnosis present

## 2015-08-25 DIAGNOSIS — M25641 Stiffness of right hand, not elsewhere classified: Secondary | ICD-10-CM | POA: Diagnosis present

## 2015-08-25 NOTE — Patient Instructions (Signed)
parafin to hands   heat to elbow R  Cross friction massage to elbow  And forearm  Wrist extensors stretch - elbow to side and AROM in neutral and pronation  5 reps hold 5   Pain free  Ice over elbow as needed

## 2015-08-25 NOTE — Therapy (Signed)
[facility] [address] [place], [place], [address] Phone: [phone]   Fax:  [phone]  Occupational Therapy Treatment  Patient Details  Name: Martha Whitney MRN: [id number] Date of Birth: December 08, 1965 Referring Provider: Dedra Skeensodd Mundy  Encounter Date: 08/25/2015      OT End of Session - 08/25/15 1842    Visit Number 2   Number of Visits 10   Date for OT Re-Evaluation 09/22/15   OT Start Time 1531   OT Stop Time 1635   OT Time Calculation (min) 64 min   Activity Tolerance Patient tolerated treatment well   [facility] tasks assessed/performed      Past Medical History:  Diagnosis Date  . Chronic kidney disease    kidney stones  . Endometriosis determined by laparoscopy   . Restless leg syndrome 2007    Past Surgical History:  Procedure Laterality Date  . ABDOMINAL HYSTERECTOMY    . CARPAL TUNNEL RELEASE Left 2003  . CHOLECYSTECTOMY    . CYSTECTOMY N/A 1969   cyst removed from throat  . GANGLION CYST EXCISION Left 10/21/2014   Procedure: REMOVAL GANGLION OF WRIST;  Surgeon: Kennedy BuckerMichael Menz, MD;  Location: [facility];  Service: Orthopedics;  Laterality: Left;  . LAPAROSCOPIC SALPINGO OOPHERECTOMY Bilateral 06/22/2014   Procedure: LAPAROSCOPIC SALPINGO OOPHORECTOMY;  Surgeon: Barron Bingharlie Pickens, MD;  Location: [facility] OR[facility];  Service: Gynecology;  Laterality: Bilateral;  . LAPAROSCOPIC TOTAL HYSTERECTOMY    . LAPAROSCOPY WITH TUBAL LIGATION    . LEEP N/A 1995  . TONSILLECTOMY      There were no vitals filed for this visit.      Subjective Assessment - 08/25/15 1534    Subjective  Hand bother me more than my elbow - feels swelling - last the 2 wks the forearm and elbow hurts more - and wearing wrist splint and that helps my elbow - I am using the Voltarin - and used Mobic some what and was told to drop down     Patient Stated Goals I want the pain better - it hurts and I cannot use my R  hand , typing , writing is hard -    Currently in Pain? Yes   Pain Score 5    Pain Location Elbow   Pain Orientation Right   Pain Descriptors / Indicators Sharp;Aching            [facility] OT Assessment - 08/25/15 0001      Strength   Right Hand Grip (lbs) 20   Right Hand Lateral Pinch 15 lbs   Right Hand 3 Point Pinch 12 lbs   Left Hand Grip (lbs) 45   Left Hand 3 Point Pinch 14 lbs                  OT Treatments/Exercises (OP) - 08/25/15 0001      RUE Paraffin   Number Minutes Paraffin 10 Minutes   RUE Paraffin Location Hand   [facility] to [facility] and stiffness      LUE Paraffin   Number Minutes Paraffin 10 Minutes   LUE Paraffin Location Hand   Comments AT [facility] to decrease pain and stiffness        heat to elbow R  Graston tools for soft tissue mobs to dorsal forearm - tight , tender , trigger points - pain proximal more than distal and around lateral epicondyle - used tool 2 and 4 for sweeping  and brushing - tolerated nr 2 more - cone prior to stretch   Cross friction massage to lateral epicondyle - pt ed on doing at home   Wrist extensors stretch - elbow to side and AROM in neutral and pronation  5 reps hold  pain   Pain free  Iontophoresis with dexamethazone at 1.4 current , and then increase to 1.8 after 14 min - medium patch over lateral epicondyle Pt ed on what to expect and skin check done at start and end           OT Education - 08/25/15 1841    Education provided Yes   Education Details HEP    Person(s) Educated Patient   Methods Explanation;Demonstration;Tactile cues;Verbal cues;Handout   Comprehension Verbal cues required;Returned demonstration;Verbalized understanding          OT Short Term Goals - 08/25/15 1846      OT SHORT TERM GOAL #1   Title Pain in R hand and forearm  on PRWHE improve by at least 20 points    Baseline Pain at eval on PRWHE 46/50   Time 3   Period Weeks   Status New           OT  Long Term Goals - 08/25/15 1847      OT LONG TERM GOAL #1   Title Function in R hand and forearm on PRHWE  improve with 20 points    Baseline at eval 45/50  for function on PRWHE   Time 4   Period Weeks   Status New     OT LONG TERM GOAL #2   Title Pt show AROM in R hand and elbow WFL to perform work and ADL's without pain    Time 5   Period Weeks   Status New               Plan - 08/25/15 1843    Clinical Impression Statement Pt report that she has seen Dr Meda Coffee rheumathologist and that her hands are some what better and she ortho MD - pain at elbow worse the last 2 wks - pain about 5/10 this date at elbow and hands - pain decrease to 2/10 in hands - pt ed on  HEP  and  modifications -  splint wearing - and  did ionto with dezamethazone this date on lateral epicondyle  -pain with wrist ext and 3rd digit extention  , tender to palpation - no pain at medial epicondyle    Rehab Potential Fair   OT Frequency 2x / week   OT Duration 4 weeks   OT Treatment/Interventions Self-care/ADL training;Contrast Bath;Iontophoresis;Therapeutic exercise;Patient/family education;Splinting;Manual Therapy;Parrafin;DME and/or AE instruction;Passive range of motion   Plan assess progress and how doing with EHP    OT Home Exercise Plan see pt instruction   Consulted and Agree with Plan of Care Patient      Patient will benefit from skilled therapeutic intervention in order to improve the following deficits and impairments:  Decreased coordination, Decreased range of motion, Impaired flexibility, Increased edema, Pain, Impaired UE functional use, Decreased strength  Visit Diagnosis: Pain In Right Arm - Plan: Ot plan of care cert/re-cert  Stiffness of right hand, not elsewhere classified - Plan: Ot plan of care cert/re-cert  Muscle weakness (generalized) - Plan: Ot plan of care cert/re-cert    Problem List Patient Active Problem List   Diagnosis Date Noted  . CAP (community acquired pneumonia)  05/23/2015    Jennilyn Esteve, Gwenette Greet  OTR/L,CLT  08/25/2015, 6:51 PM  [place] [facility] [address] [place], [address] Phone: [phone]   Fax:  [phone]  Name: Martha Whitney MRN: [id number] Date of Birth: 1965-06-14

## 2015-08-30 ENCOUNTER — Ambulatory Visit: Payer: BC Managed Care – PPO | Admitting: Occupational Therapy

## 2015-08-30 DIAGNOSIS — M6281 Muscle weakness (generalized): Secondary | ICD-10-CM

## 2015-08-30 DIAGNOSIS — M79601 Pain in right arm: Secondary | ICD-10-CM | POA: Diagnosis not present

## 2015-08-30 DIAGNOSIS — M25641 Stiffness of right hand, not elsewhere classified: Secondary | ICD-10-CM

## 2015-08-30 NOTE — Therapy (Signed)
[facility] [address], [place], [address] Phone: [phone]   Fax:  [phone]  Occupational Therapy Treatment  Patient Details  Name: Martha Whitney MRN: [id number] Date of Birth: 1965/02/24 Referring Provider: Reche Dixon  Encounter Date: 08/30/2015      OT End of Session - 08/30/15 1425    Visit Number 3   Number of Visits 10   Date for OT Re-Evaluation 09/22/15   OT Start Time 1203   OT Stop Time 1309   OT Time Calculation (min) 66 min   Activity Tolerance Patient tolerated treatment well   Behavior During Therapy [facility] for tasks assessed/performed      Past Medical History:  Diagnosis Date  . Chronic kidney disease    kidney stones  . Endometriosis determined by laparoscopy   . Restless leg syndrome 2007    Past Surgical History:  Procedure Laterality Date  . ABDOMINAL HYSTERECTOMY    . CARPAL TUNNEL RELEASE Left 2003  . CHOLECYSTECTOMY    . CYSTECTOMY N/A 1969   cyst removed from throat  . GANGLION CYST EXCISION Left 10/21/2014   Procedure: REMOVAL GANGLION OF WRIST;  Surgeon: Hessie Knows, MD;  Location: [facility] ORS;  Service: Orthopedics;  Laterality: Left;  . LAPAROSCOPIC SALPINGO OOPHERECTOMY Bilateral 06/22/2014   Procedure: LAPAROSCOPIC SALPINGO OOPHORECTOMY;  Surgeon: Aletha Halim, MD;  Location: [facility] ORS;  Service: Gynecology;  Laterality: Bilateral;  . LAPAROSCOPIC TOTAL HYSTERECTOMY    . LAPAROSCOPY WITH TUBAL LIGATION    . LEEP N/A 1995  . TONSILLECTOMY      There were no vitals filed for this visit.      Subjective Assessment - 08/30/15 1419    Subjective  Doing better -hand better - forearm not as bad- about 3-4/10 at the worse- not as tender - still wearing my splint and doing the heat , massage , ice and stretch    Patient Stated Goals I want the pain better - it hurts and I cannot use my R hand , typing , writing is hard -    Currently in Pain? Yes   Pain  Score 3    Pain Location Elbow   Pain Orientation Right   Pain Descriptors / Indicators Aching   Pain Type Chronic pain   Pain Onset More than a month ago            [facility] OT Assessment - 08/30/15 0001      Strength   Right Hand Grip (lbs) 36   Left Hand Grip (lbs) 45                  OT Treatments/Exercises (OP) - 08/30/15 0001      Moist Heat Therapy   Number Minutes Moist Heat 10 Minutes   Moist Heat Location Elbow     RUE Paraffin   Number Minutes Paraffin 10 Minutes   RUE Paraffin Location Hand   Comments at [facility] to decrease pain at hand and increase ROM     R Grip increase again by 16 lbs   heat to elbow R , hand and forearm - over parafin for hand Graston tools for soft tissue mobs to dorsal forearm - tight , tender , trigger points much better - still present proximal and radial to lateral epicondyle -  And around lateral epicondyle - used tool 2 and 4 for sweeping and brushing -  Increase wrist flexion afterward - and not as tender as  last week    Cross friction massage to lateral epicondyle - pt ed on doing at home - show understanding  Wrist extensors stretch - elbow to side and AROM in neutral and pronation  - but hand in loose fist now 5 reps hold 5 sec   Pain free  Iontophoresis with dexamethazone at 1.2 current , and then increase to 1.6 after 10 min - medium patch over lateral epicondyle Pt ed on what to expect and skin check done at start and ed on keeping patch on for a hour               OT Education - 08/30/15 1425    Education provided Yes   Education Details HEP   Person(s) Educated Patient   Methods Explanation;Demonstration;Tactile cues;Verbal cues;Handout   Comprehension Verbal cues required;Returned demonstration;Verbalized understanding          OT Short Term Goals - 08/25/15 1846      OT SHORT TERM GOAL #1   Title Pain in R hand and forearm  on PRWHE improve by at least 20 points    Baseline Pain at eval  on PRWHE 46/50   Time 3   Period Weeks   Status New           OT Long Term Goals - 08/25/15 1847      OT LONG TERM GOAL #1   Title Function in R hand and forearm on PRHWE  improve with 20 points    Baseline at eval 45/50  for function on PRWHE   Time 4   Period Weeks   Status New     OT LONG TERM GOAL #2   Title Pt show AROM in R hand and elbow WFL to perform work and ADL's without pain    Time 5   Period Weeks   Status New               Plan - 08/30/15 1426    Clinical Impression Statement Pt show increase grip this date compare to last time again - decrease pain , triggerpoints on dorsal forearm - able to upgrade HEP and 2nd session of ionto this date - still having some trouble to tolerate it at 2.0 current - but able to increase slowly during it 2 x  - cont to decrease pain    Rehab Potential Fair   OT Frequency 2x / week   OT Duration 4 weeks   OT Treatment/Interventions Self-care/ADL training;Contrast Bath;Iontophoresis;Therapeutic exercise;Patient/family education;Splinting;Manual Therapy;Parrafin;DME and/or AE instruction;Passive range of motion   Plan assess progress and with HEP   OT Home Exercise Plan see pt instruction   Consulted and Agree with Plan of Care Patient      Patient will benefit from skilled therapeutic intervention in order to improve the following deficits and impairments:  Decreased coordination, Decreased range of motion, Impaired flexibility, Increased edema, Pain, Impaired UE functional use, Decreased strength  Visit Diagnosis: Pain In Right Arm  Stiffness of right hand, not elsewhere classified  Muscle weakness (generalized)    Problem List Patient Active Problem List   Diagnosis Date Noted  . CAP (community acquired pneumonia) 05/23/2015    Rosalyn Gess OTR/L,CLT  08/30/2015, 2:30 PM  [place] [address] [place], [address] Phone:  [phone]   Fax:  [phone]  Name: LYZA BRISKEY MRN: [id number] Date of Birth: 05-07-1965

## 2015-08-30 NOTE — Patient Instructions (Signed)
Heat   Cross friction massage to lateral epicondyle - pt ed on doing at home - show understanding  Wrist extensors stretch - elbow to side and AROM in neutral and pronation  - but hand in loose fist now 5 reps hold 5 sec   Wrist splint  Ice end  Modify activities

## 2015-09-01 ENCOUNTER — Ambulatory Visit: Payer: BC Managed Care – PPO | Admitting: Occupational Therapy

## 2015-09-01 DIAGNOSIS — M79601 Pain in right arm: Secondary | ICD-10-CM | POA: Diagnosis not present

## 2015-09-01 DIAGNOSIS — M25641 Stiffness of right hand, not elsewhere classified: Secondary | ICD-10-CM

## 2015-09-01 DIAGNOSIS — M6281 Muscle weakness (generalized): Secondary | ICD-10-CM

## 2015-09-01 NOTE — Patient Instructions (Addendum)
Same HEP  

## 2015-09-01 NOTE — Therapy (Signed)
[facility] [address], [place], [address] Phone: [phone]   Fax:  [phone]  Occupational Therapy Treatment  Patient Details  Name: Martha Whitney MRN: [id number] Date of Birth: July 10, 1965 Referring Provider: Reche Dixon  Encounter Date: 09/01/2015      OT End of Session - 09/01/15 1141    Visit Number 4   Number of Visits 10   Date for OT Re-Evaluation 09/22/15   OT Start Time 1138   OT Stop Time 1245   OT Time Calculation (min) 67 min   Activity Tolerance Patient tolerated treatment well   Behavior During Therapy [facility] for tasks assessed/performed      Past Medical History:  Diagnosis Date  . Chronic kidney disease    kidney stones  . Endometriosis determined by laparoscopy   . Restless leg syndrome 2007    Past Surgical History:  Procedure Laterality Date  . ABDOMINAL HYSTERECTOMY    . CARPAL TUNNEL RELEASE Left 2003  . CHOLECYSTECTOMY    . CYSTECTOMY N/A 1969   cyst removed from throat  . GANGLION CYST EXCISION Left 10/21/2014   Procedure: REMOVAL GANGLION OF WRIST;  Surgeon: Hessie Knows, MD;  Location: [facility] ORS;  Service: Orthopedics;  Laterality: Left;  . LAPAROSCOPIC SALPINGO OOPHERECTOMY Bilateral 06/22/2014   Procedure: LAPAROSCOPIC SALPINGO OOPHORECTOMY;  Surgeon: Aletha Halim, MD;  Location: [facility] ORS;  Service: Gynecology;  Laterality: Bilateral;  . LAPAROSCOPIC TOTAL HYSTERECTOMY    . LAPAROSCOPY WITH TUBAL LIGATION    . LEEP N/A 1995  . TONSILLECTOMY      There were no vitals filed for this visit.      Subjective Assessment - 09/01/15 1138    Subjective  My elbow since last time was more ache - we did had some [employer] boxes to to unpack - pain even went up to 6/10    Patient Stated Goals I want the pain better - it hurts and I cannot use my R hand , typing , writing is hard -    Currently in Pain? Yes   Pain Score 2    Pain Location Elbow   Pain Orientation  Right   Pain Descriptors / Indicators Aching          heat to elbow R , hand and forearm - over parafin for hand Graston tools for soft tissue mobs to dorsal forearm - tight , tender , trigger points worse proximal forearm  and radial to lateral epicondyle -  And around lateral epicondyle - used tool 2 and 4 for sweeping and brushing -  Increase wrist flexion afterward - more tender that last time - pt to do some massaging over weekend   Cross friction massage to lateral epicondyle - pt ed on doing at home - show understanding  Wrist extensors stretch - elbow to side and AROM in neutral and pronation  - open hand first and then loose fist  5 reps hold 5 sec  - each   Pain free  - had pull loose fist down  Iontophoresis with dexamethazone at 0.8 gradually increase to 1.6  - medium patch over lateral epicondyle Pt ed on what to expect and skin check done again - had to go several times to increase current - more tender even than last time Skin check  at start and ed on keeping patch on for a hour  OT Treatments/Exercises (OP) - 09/01/15 0001      RUE Paraffin   Number Minutes Paraffin 10 Minutes   RUE Paraffin Location Hand   Comments At [facility] to decrease pain and increase ROM                 OT Education - 09/01/15 1141    Education provided Yes   Education Details HEP   Person(s) Educated Patient   Methods Demonstration;Tactile cues;Verbal cues;Explanation;Handout   Comprehension Verbalized understanding;Returned demonstration;Verbal cues required          OT Short Term Goals - 08/25/15 1846      OT SHORT TERM GOAL #1   Title Pain in R hand and forearm  on PRWHE improve by at least 20 points    Baseline Pain at eval on PRWHE 46/50   Time 3   Period Weeks   Status New           OT Long Term Goals - 08/25/15 1847      OT LONG TERM GOAL #1   Title Function in R hand and forearm on PRHWE  improve with 20 points    Baseline at  eval 45/50  for function on PRWHE   Time 4   Period Weeks   Status New     OT LONG TERM GOAL #2   Title Pt show AROM in R hand and elbow WFL to perform work and ADL's without pain    Time 5   Period Weeks   Status New               Plan - 09/01/15 1147    Clinical Impression Statement Pt pain still improving but was more tender and pain at elbow than last time - hands much better - pt ed again on HEP and use of hand - modifying activities - doing massage at thome and use ice -    Rehab Potential Fair   OT Frequency 2x / week   OT Duration 4 weeks   OT Treatment/Interventions Self-care/ADL training;Contrast Bath;Iontophoresis;Therapeutic exercise;Patient/family education;Splinting;Manual Therapy;Parrafin;DME and/or AE instruction;Passive range of motion   OT Home Exercise Plan see pt instruction   Consulted and Agree with Plan of Care Patient      Patient will benefit from skilled therapeutic intervention in order to improve the following deficits and impairments:  Decreased coordination, Decreased range of motion, Impaired flexibility, Increased edema, Pain, Impaired UE functional use, Decreased strength  Visit Diagnosis: Pain In Right Arm  Stiffness of right hand, not elsewhere classified  Muscle weakness (generalized)    Problem List Patient Active Problem List   Diagnosis Date Noted  . CAP (community acquired pneumonia) 05/23/2015    Rosalyn Gess OTR/L,CLT 09/01/2015, 12:31 PM  [place] [facility] [address] [place], [address] Phone: [phone]   Fax:  [phone]  Name: Martha Whitney MRN: [id number] Date of Birth: 1965/08/28

## 2015-09-06 ENCOUNTER — Ambulatory Visit: Payer: BC Managed Care – PPO | Attending: Orthopedic Surgery | Admitting: Occupational Therapy

## 2015-09-06 DIAGNOSIS — M25641 Stiffness of right hand, not elsewhere classified: Secondary | ICD-10-CM | POA: Insufficient documentation

## 2015-09-06 DIAGNOSIS — M79601 Pain in right arm: Secondary | ICD-10-CM | POA: Insufficient documentation

## 2015-09-06 DIAGNOSIS — M6281 Muscle weakness (generalized): Secondary | ICD-10-CM | POA: Diagnosis present

## 2015-09-06 NOTE — Patient Instructions (Addendum)
heat to elbow R  At [facility] friction massage to lateral epicondyle - pt ed on doing at home - show understanding Still modify activities and splint wearing - will start to wean next week   Wrist extensors stretch - elbow extended AROM in neutral and pronation - open hand  5 reps hold 5 sec  - each

## 2015-09-06 NOTE — Therapy (Signed)
[place] [facility] [address] [place], [address] Phone: [phone]   Fax:  [phone]  Occupational Therapy Treatment  Patient Details  Name: Martha Whitney MRN: [id number] Date of Birth: May 22, 1965 Referring Provider: Reche Dixon  Encounter Date: 09/06/2015      OT End of Session - 09/06/15 1308    Visit Number 5   Number of Visits 10   Date for OT Re-Evaluation 09/22/15   OT Start Time [id number]   OT Stop Time 1350   OT Time Calculation (min) 54 min   Activity Tolerance Patient tolerated treatment well   Behavior During Therapy [facility] for tasks assessed/performed      Past Medical History:  Diagnosis Date  . Chronic kidney disease    kidney stones  . Endometriosis determined by laparoscopy   . Restless leg syndrome 2007    Past Surgical History:  Procedure Laterality Date  . ABDOMINAL HYSTERECTOMY    . CARPAL TUNNEL RELEASE Left 2003  . CHOLECYSTECTOMY    . CYSTECTOMY N/A 1969   cyst removed from throat  . GANGLION CYST EXCISION Left 10/21/2014   Procedure: REMOVAL GANGLION OF WRIST;  Surgeon: Hessie Knows, MD;  Location: [facility];  Service: Orthopedics;  Laterality: Left;  . LAPAROSCOPIC SALPINGO OOPHERECTOMY Bilateral 06/22/2014   Procedure: LAPAROSCOPIC SALPINGO OOPHORECTOMY;  Surgeon: Aletha Halim, MD;  Location: [facility];  Service: Gynecology;  Laterality: Bilateral;  . LAPAROSCOPIC TOTAL HYSTERECTOMY    . LAPAROSCOPY WITH TUBAL LIGATION    . LEEP N/A 1995  . TONSILLECTOMY      There were no vitals filed for this visit.      Subjective Assessment - 09/06/15 1303    Subjective  Pain is much better at my elbow  over the weekend - and my hand too - maybe 1/10 for hand and elbow at the worse 3/10    Patient Stated Goals I want the pain better - it hurts and I cannot use my R hand , typing , writing is hard -    Currently in Pain? Yes   Pain Score 1    Pain Location Elbow   Pain  Orientation Right   Pain Descriptors / Indicators Aching   Pain Type Chronic pain                      OT Treatments/Exercises (OP) - 09/06/15 0001      Moist Heat Therapy   Number Minutes Moist Heat 10 Minutes   Moist Heat Location Elbow    heat to elbow R  At [facility] No pain increase at elbow with middle finger and wrist extention resistance  Tenderness much better-    Graston tools for soft tissue mobs to dorsal forearm  With focus more proximal  - radial to lateral epicondyle -tenderness much better -   used tool 2 and 4 for sweeping and brushing - Increase wrist flexion with extended arm in pronation and  Neutral  - she did some massaging over weekend  Cross friction massage to lateral epicondyle - pt ed on doing at home - show understanding  Wrist extensors stretch - elbow extended AROM in neutral and pronation - open hand  5 reps hold 5 sec  - each   Pain free  - great progress the last 2 wks   Iontophoresis with dexamethazone at 1.2 gradually increase ti 3 x up to 1.8   - medium patch over lateral epicondyle  Pt ed on what to expect and skin check done again - had to go  times to increase current  Skin check  at start and ed on keeping patch on for a hour              OT Education - 09/06/15 1308    Education provided Yes   Education Details HEP changes   Person(s) Educated Patient   Methods Explanation;Demonstration;Tactile cues;Verbal cues   Comprehension Verbal cues required;Returned demonstration;Verbalized understanding          OT Short Term Goals - 08/25/15 1846      OT SHORT TERM GOAL #1   Title Pain in R hand and forearm  on PRWHE improve by at least 20 points    Baseline Pain at eval on PRWHE 46/50   Time 3   Period Weeks   Status New           OT Long Term Goals - 08/25/15 1847      OT LONG TERM GOAL #1   Title Function in R hand and forearm on PRHWE  improve with 20 points    Baseline at eval 45/50  for function  on PRWHE   Time 4   Period Weeks   Status New     OT LONG TERM GOAL #2   Title Pt show AROM in R hand and elbow WFL to perform work and ADL's without pain    Time 5   Period Weeks   Status New               Plan - 09/06/15 1309    Clinical Impression Statement Pt making great progress in hand pain and elbow - tenderness much better and  able to increase HEP for stretches  - pt had 4th session of ionto this date    Rehab Potential Good   OT Frequency 2x / week   OT Duration 4 weeks   OT Treatment/Interventions Self-care/ADL training;Contrast Bath;Iontophoresis;Therapeutic exercise;Patient/family education;Splinting;Manual Therapy;Parrafin;DME and/or AE instruction;Passive range of motion   Plan assess progress in pain    OT Home Exercise Plan see pt instruction   Consulted and Agree with Plan of Care Patient      Patient will benefit from skilled therapeutic intervention in order to improve the following deficits and impairments:  Decreased coordination, Decreased range of motion, Impaired flexibility, Increased edema, Pain, Impaired UE functional use, Decreased strength  Visit Diagnosis: Pain In Right Arm  Stiffness of right hand, not elsewhere classified  Muscle weakness (generalized)    Problem List Patient Active Problem List   Diagnosis Date Noted  . CAP (community acquired pneumonia) 05/23/2015    Rosalyn Gess OTR/L,CLT  09/06/2015, 7:13 PM  [facility] [address], [place], [address] Phone: [phone]   Fax:  [phone]  Name: Martha Whitney MRN: [id number] Date of Birth: [birth year]

## 2015-09-08 ENCOUNTER — Ambulatory Visit: Payer: BC Managed Care – PPO | Admitting: Occupational Therapy

## 2015-09-08 DIAGNOSIS — M25641 Stiffness of right hand, not elsewhere classified: Secondary | ICD-10-CM

## 2015-09-08 DIAGNOSIS — M79601 Pain in right arm: Secondary | ICD-10-CM

## 2015-09-08 DIAGNOSIS — M6281 Muscle weakness (generalized): Secondary | ICD-10-CM

## 2015-09-08 NOTE — Therapy (Signed)
[facility] [address] [place], [address] Phone: [phone]   Fax:  [phone]  Occupational Therapy Treatment  Patient Details  Name: Martha Whitney MRN: [id number] Date of Birth: August 05, 1965 Referring Provider: Reche Dixon  Encounter Date: 09/08/2015      OT End of Session - 09/08/15 1957    Visit Number 6   Number of Visits 10   Date for OT Re-Evaluation 09/22/15   OT Start Time 1142   OT Stop Time 1230   OT Time Calculation (min) 48 min   Activity Tolerance Patient tolerated treatment well   Behavior During Therapy [facility] for tasks assessed/performed      Past Medical History:  Diagnosis Date  . Chronic kidney disease    kidney stones  . Endometriosis determined by laparoscopy   . Restless leg syndrome 2007    Past Surgical History:  Procedure Laterality Date  . ABDOMINAL HYSTERECTOMY    . CARPAL TUNNEL RELEASE Left 2003  . CHOLECYSTECTOMY    . CYSTECTOMY N/A 1969   cyst removed from throat  . GANGLION CYST EXCISION Left 10/21/2014   Procedure: REMOVAL GANGLION OF WRIST;  Surgeon: Hessie Knows, MD;  Location: [facility] OR[facility];  Service: Orthopedics;  Laterality: Left;  . LAPAROSCOPIC SALPINGO OOPHERECTOMY Bilateral 06/22/2014   Procedure: LAPAROSCOPIC SALPINGO OOPHORECTOMY;  Surgeon: Aletha Halim, MD;  Location: [facility] ORS;  Service: Gynecology;  Laterality: Bilateral;  . LAPAROSCOPIC TOTAL HYSTERECTOMY    . LAPAROSCOPY WITH TUBAL LIGATION    . LEEP N/A 1995  . TONSILLECTOMY      There were no vitals filed for this visit.      Subjective Assessment - 09/08/15 1954    Subjective  My hand was bothering my yesterday with the rain - but today better - my elbow getting better   Patient Stated Goals I want the pain better - it hurts and I cannot use my R hand , typing , writing is hard -    Currently in Pain? Yes   Pain Score 1    Pain Location Hand   Pain Orientation Right   Pain  Descriptors / Indicators Aching   Pain Type Chronic pain                      OT Treatments/Exercises (OP) - 09/08/15 0001      RUE Paraffin   Number Minutes Paraffin 10 Minutes   RUE Paraffin Location Hand   Comments Att soc to decrease pain at hand and wrist       heat to R  elbow  And hand over paraffin   at [facility] No pain increase at elbow with middle finger and wrist extention resistance  Tenderness much better- over forearm - still around elbow and lateral epicondyle - tolerate  Graston tools for soft tissue mobs to dorsal forearm  With focus more proximal  - and around areas surrounding  lateral epicondyle -tenderness much better -   used tool 2 and 4 for sweeping and brushing - Increase wrist flexion with extended arm in pronation and  Neutral     Wrist extensors stretch - elbow extended AROM in neutral and pronation - open hand  5 reps hold 5 sec - each    Iontophoresis with dexamethazone at 0.6  gradually increase 3 x until got to 1.8  - medium patch over lateral epicondyle Assess every 5 min if can increase current - skin check at  start and pt to keep patch on for a hour               OT Education - 09/08/15 1957    Education provided Yes   Education Details HEP review with pt     Person(s) Educated Patient   Methods Explanation;Demonstration;Tactile cues   Comprehension Returned demonstration;Verbal cues required;Verbalized understanding          OT Short Term Goals - 08/25/15 1846      OT SHORT TERM GOAL #1   Title Pain in R hand and forearm  on PRWHE improve by at least 20 points    Baseline Pain at eval on PRWHE 46/50   Time 3   Period Weeks   Status New           OT Long Term Goals - 08/25/15 1847      OT LONG TERM GOAL #1   Title Function in R hand and forearm on PRHWE  improve with 20 points    Baseline at eval 45/50  for function on PRWHE   Time 4   Period Weeks   Status New     OT LONG TERM GOAL #2    Title Pt show AROM in R hand and elbow WFL to perform work and ADL's without pain    Time 5   Period Weeks   Status New               Plan - 09/08/15 1958    Clinical Impression Statement Pt had increase pain yesterday in R hand because of rainy weather but this date about 1/10 - elbow pain decreasing able to do extended arm stretches and tolerate Graston tools better    Rehab Potential Good   OT Frequency 2x / week   OT Duration 4 weeks   OT Treatment/Interventions Self-care/ADL training;Contrast Bath;Iontophoresis;Therapeutic exercise;Patient/family education;Splinting;Manual Therapy;Parrafin;DME and/or AE instruction;Passive range of motion   Plan assess pain = progress- grip and if can upgrade stretches   OT Home Exercise Plan see pt instruction   Consulted and Agree with Plan of Care Patient      Patient will benefit from skilled therapeutic intervention in order to improve the following deficits and impairments:  Decreased coordination, Decreased range of motion, Impaired flexibility, Increased edema, Pain, Impaired UE functional use, Decreased strength  Visit Diagnosis: Pain In Right Arm  Stiffness of right hand, not elsewhere classified  Muscle weakness (generalized)    Problem List Patient Active Problem List   Diagnosis Date Noted  . CAP (community acquired pneumonia) 05/23/2015    Rosalyn Gess OTR/L,CLT  09/08/2015, 8:05 PM  [place] [facility] [address], [place], [address] Phone: [phone]   Fax:  [phone]  Name: DOMINI DHILLON MRN: [id number] Date of Birth: 02/21/1965

## 2015-09-08 NOTE — Patient Instructions (Signed)
Same HEP for heat , criss cross massage  Wrist extensors stretch - elbow extended AROM in neutral and pronation - open hand  5 reps hold 5 sec - each  Ice  Splint for wrist still wearing    Iontophoresis with dexamethazone at 0.6  gradually increase 3 x until got to 1.8  - medium patch over lateral epicondyle Assess every 5 min if can increase current - skin check at start and pt to keep patch on for a hour

## 2015-09-13 ENCOUNTER — Ambulatory Visit: Payer: BC Managed Care – PPO | Admitting: Occupational Therapy

## 2015-09-15 ENCOUNTER — Ambulatory Visit: Payer: BC Managed Care – PPO | Admitting: Occupational Therapy

## 2015-09-21 ENCOUNTER — Ambulatory Visit: Payer: BC Managed Care – PPO | Admitting: Occupational Therapy

## 2015-09-21 DIAGNOSIS — M25641 Stiffness of right hand, not elsewhere classified: Secondary | ICD-10-CM

## 2015-09-21 DIAGNOSIS — M79601 Pain in right arm: Secondary | ICD-10-CM | POA: Diagnosis not present

## 2015-09-21 DIAGNOSIS — M6281 Muscle weakness (generalized): Secondary | ICD-10-CM

## 2015-09-21 NOTE — Therapy (Signed)
[place] [facility] [address] [place], [address] Phone: [phone]   Fax:  [phone]  Occupational Therapy Treatment  Patient Details  Name: Martha Whitney MRN: [id number] Date of Birth: [birth year] Referring Provider: Reche Dixon  Encounter Date: 09/21/2015      OT End of Session - 09/21/15 1736    Visit Number 7   Number of Visits 10   Date for OT Re-Evaluation 09/22/15   OT Start Time [id number]   OT Stop Time 1230   OT Time Calculation (min) 59 min   Activity Tolerance Patient tolerated treatment well   Behavior During Therapy [facility] for tasks assessed/performed      Past Medical History:  Diagnosis Date  . Chronic kidney disease    kidney stones  . Endometriosis determined by laparoscopy   . Restless leg syndrome 2007    Past Surgical History:  Procedure Laterality Date  . ABDOMINAL HYSTERECTOMY    . CARPAL TUNNEL RELEASE Left 2003  . CHOLECYSTECTOMY    . CYSTECTOMY N/A 1969   cyst removed from throat  . GANGLION CYST EXCISION Left 10/21/2014   Procedure: REMOVAL GANGLION OF WRIST;  Surgeon: Hessie Knows, MD;  Location: [facility] ORS;  Service: Orthopedics;  Laterality: Left;  . LAPAROSCOPIC SALPINGO OOPHERECTOMY Bilateral 06/22/2014   Procedure: LAPAROSCOPIC SALPINGO OOPHORECTOMY;  Surgeon: Aletha Halim, MD;  Location: [facility] ORS;  Service: Gynecology;  Laterality: Bilateral;  . LAPAROSCOPIC TOTAL HYSTERECTOMY    . LAPAROSCOPY WITH TUBAL LIGATION    . LEEP N/A 1995  . TONSILLECTOMY      There were no vitals filed for this visit.      Subjective Assessment - 09/21/15 1734    Subjective  I was sick last week - didn't want to make you sick - but doing okay -my hand doing great - elbow some what sore - not to bad    Patient Stated Goals I want the pain better - it hurts and I cannot use my R hand , typing , writing is hard -    Currently in Pain? Yes   Pain Score 3    Pain Location Elbow   Pain  Orientation Right   Pain Descriptors / Indicators Aching   Pain Type Chronic pain   Pain Onset More than a month ago   Pain Frequency Intermittent                      OT Treatments/Exercises (OP) - 09/21/15 0001      Moist Heat Therapy   Number Minutes Moist Heat 10 Minutes   Moist Heat Location Elbow       heat to R  elbow  No pain increase at elbow with middle finger  Ext but pull with wrist extention resistance  Little more tender  Over proximal  Forearm this date  - still around elbow and lateral epicondyle - but able to tolerate soft tissue mobs  Graston tools for soft tissue mobs to dorsal forearm With focus more proximal - and around areas surrounding  lateral epicondyle -tenderness much better - used tool 2 and 4 for sweeping and brushing - Increase wrist flexion with extended arm in pronation and Neutral  with loose fist    Wrist extensors stretch - elbow extended AROM in neutral and pronation - open hand  And loose fist  Add to HEP both 5 reps hold 5 sec - each   Iontophoresis with dexamethazone at  0.8  gradually increase 1 x until got to 1.7 - medium patch over lateral epicondyle Assess  2 x if can increase  current - skin check at start and pt to keep patch on for a hour            OT Education - 09/21/15 1736    Education provided Yes   Education Details HEP changes   Person(s) Educated Patient   Methods Explanation   Comprehension Verbalized understanding;Returned demonstration          OT Short Term Goals - 08/25/15 1846      OT SHORT TERM GOAL #1   Title Pain in R hand and forearm  on PRWHE improve by at least 20 points    Baseline Pain at eval on PRWHE 46/50   Time 3   Period Weeks   Status New           OT Long Term Goals - 08/25/15 1847      OT LONG TERM GOAL #1   Title Function in R hand and forearm on PRHWE  improve with 20 points    Baseline at eval 45/50  for function on PRWHE   Time 4   Period  Weeks   Status New     OT LONG TERM GOAL #2   Title Pt show AROM in R hand and elbow WFL to perform work and ADL's without pain    Time 5   Period Weeks   Status New               Plan - 09/21/15 1737    Rehab Potential Good   OT Duration 2 weeks   OT Treatment/Interventions Self-care/ADL training;Contrast Bath;Iontophoresis;Therapeutic exercise;Patient/family education;Splinting;Manual Therapy;Parrafin;DME and/or AE instruction;Passive range of motion   Plan assess PREE- pain and grip    OT Home Exercise Plan see pt instruction   Consulted and Agree with Plan of Care Patient      Patient will benefit from skilled therapeutic intervention in order to improve the following deficits and impairments:  Decreased coordination, Decreased range of motion, Impaired flexibility, Increased edema, Pain, Impaired UE functional use, Decreased strength  Visit Diagnosis: Pain In Right Arm  Stiffness of right hand, not elsewhere classified  Muscle weakness (generalized)    Problem List Patient Active Problem List   Diagnosis Date Noted  . CAP (community acquired pneumonia) 05/23/2015    Rosalyn Gess 09/21/2015, 5:44 PM  [place] [facility] [address] [place], [address] Phone: [phone]   Fax:  [phone]  Name: Martha Whitney MRN: [id number] Date of Birth: [birth year]

## 2015-09-21 NOTE — Patient Instructions (Signed)
Same HEp but can do both Wrist extensors stretch - elbow extended AROM in neutral and pronation - open hand  And loose fist   5 reps hold 5 sec - each   Cont with same HEP

## 2015-09-23 ENCOUNTER — Ambulatory Visit: Payer: BC Managed Care – PPO | Admitting: Occupational Therapy

## 2015-09-23 DIAGNOSIS — M79601 Pain in right arm: Secondary | ICD-10-CM | POA: Diagnosis not present

## 2015-09-23 DIAGNOSIS — M6281 Muscle weakness (generalized): Secondary | ICD-10-CM

## 2015-09-23 DIAGNOSIS — M25641 Stiffness of right hand, not elsewhere classified: Secondary | ICD-10-CM

## 2015-09-23 NOTE — Therapy (Signed)
[place] [facility] [address], [place], [address] Phone: [phone]   Fax:  [phone]  Occupational Therapy Treatment  Patient Details  Name: Martha Whitney MRN: [id number] Date of Birth: 01-24-1965 Referring Provider: Reche Dixon  Encounter Date: 09/23/2015      OT End of Session - 09/23/15 1259    Visit Number 8   Number of Visits 11   Date for OT Re-Evaluation 10/14/15   OT Start Time 1030   OT Stop Time 1134   OT Time Calculation (min) 64 min   Activity Tolerance Patient tolerated treatment well   Behavior During Therapy [facility] for tasks assessed/performed      Past Medical History:  Diagnosis Date  . Chronic kidney disease    kidney stones  . Endometriosis determined by laparoscopy   . Restless leg syndrome 2007    Past Surgical History:  Procedure Laterality Date  . ABDOMINAL HYSTERECTOMY    . CARPAL TUNNEL RELEASE Left 2003  . CHOLECYSTECTOMY    . CYSTECTOMY N/A 1969   cyst removed from throat  . GANGLION CYST EXCISION Left 10/21/2014   Procedure: REMOVAL GANGLION OF WRIST;  Surgeon: Hessie Knows, MD;  Location: [facility] ORS;  Service: Orthopedics;  Laterality: Left;  . LAPAROSCOPIC SALPINGO OOPHERECTOMY Bilateral 06/22/2014   Procedure: LAPAROSCOPIC SALPINGO OOPHORECTOMY;  Surgeon: Aletha Halim, MD;  Location: [facility] ORS;  Service: Gynecology;  Laterality: Bilateral;  . LAPAROSCOPIC TOTAL HYSTERECTOMY    . LAPAROSCOPY WITH TUBAL LIGATION    . LEEP N/A 1995  . TONSILLECTOMY      There were no vitals filed for this visit.      Subjective Assessment - 09/23/15 1127    Subjective  I don't know what I did, but my forearm to elbow hurting and sore today - still wearing splint about 80% of time - not sleeping with it   Patient Stated Goals I want the pain better - it hurts and I cannot use my R hand , typing , writing is hard -    Currently in Pain? Yes   Pain Score 4    Pain Location Elbow    Pain Orientation Right   Pain Descriptors / Indicators Sore                    OT Treatments/Exercises (OP) - 09/23/15 0001      RUE Contrast Bath   Time 11 minutes   Comments at [facility] to decrease pain at forearm and elbow       No pain increase at elbow with middle finger  Ext but pull with wrist extention resistance  Pain with resistance to supination and pronation  Increase tenderness  Over proximal  Forearm this date  - still around elbow and lateral epicondyle  Graston tools for soft tissue mobs to dorsal forearm With focus more proximal - and around areas surrounding lateral epicondyle- used tool 2 and 4 for sweeping and brushing     Wrist extensors stretch - elbow extended AROM in neutral and pronation - open hand  HOLD off on close fist stretch 5 reps hold 5 sec   Iontophoresis with dexamethazone at 0.6 gradually increase 3 x until got to 1.7 - medium patch over lateral epicondyle Assess  3 x if can increase  current - skin check at start and pt to keep patch on for a hour  OT Education - 09/23/15 1259    Education provided Yes   Education Details HEP update    Person(s) Educated Patient   Methods Explanation;Demonstration;Tactile cues;Verbal cues   Comprehension Verbal cues required;Returned demonstration;Verbalized understanding          OT Short Term Goals - 09/23/15 1355      OT SHORT TERM GOAL #1   Title Pain in R hand and forearm  on PRWHE improve by at least 20 points    Baseline Pain at eval on PRWHE 46/50 and now 28/50   Time 3   Period Weeks   Status On-going           OT Long Term Goals - 09/23/15 1356      OT LONG TERM GOAL #1   Title Function in R hand and forearm on PRHWE  improve with 20 points    Baseline at eval 45/50  for function on PRWHE; 25/50 now    Status Achieved     OT LONG TERM GOAL #2   Title Pt show AROM in R hand and elbow WFL to perform work and ADL's without pain     Baseline R hand pain great but increase pain still at elbow - with ADL's and work   Time 3   Period Weeks   Status On-going     OT LONG TERM GOAL #3   Title Function on PREE improve with 10 points    Baseline Function at eval 45/50 ,  on 09/23/15 it is 25/50   Time 3   Period Weeks   Status On-going               Plan - 09/23/15 1300    Clinical Impression Statement Pt with more soreness coming in this at proximal forearm and elbow - increase tenderness - pt to decrease stretch to open hand and switch 50% of time into soft wrist splint - maybe increase use and fighting splint - pain did decrease towards end ofsession    Rehab Potential Good   OT Frequency 2x / week   OT Duration 2 weeks   OT Treatment/Interventions Self-care/ADL training;Contrast Bath;Iontophoresis;Therapeutic exercise;Patient/family education;Splinting;Manual Therapy;Parrafin;DME and/or AE instruction;Passive range of motion   Plan assess how doing with progress pain in forearm/elbow   OT Home Exercise Plan see pt instruction   Consulted and Agree with Plan of Care Patient      Patient will benefit from skilled therapeutic intervention in order to improve the following deficits and impairments:  Decreased coordination, Decreased range of motion, Impaired flexibility, Increased edema, Pain, Impaired UE functional use, Decreased strength, Abnormal gait  Visit Diagnosis: Pain In Right Arm - Plan: Ot plan of care cert/re-cert  Stiffness of right hand, not elsewhere classified - Plan: Ot plan of care cert/re-cert  Muscle weakness (generalized) - Plan: Ot plan of care cert/re-cert    Problem List Patient Active Problem List   Diagnosis Date Noted  . CAP (community acquired pneumonia) 05/23/2015    Rosalyn Gess OTR/L,CLT 09/23/2015, 2:10 PM  [place] [facility] [address] [place], [address] Phone: [phone]   Fax:   [phone]  Name: GIANELLY SIME MRN: [id number] Date of Birth: 1965/12/18

## 2015-09-23 NOTE — Patient Instructions (Signed)
To only do open hand wrist flexion stretch - extended arm in neutral and pronation  NOT close fist  Contrast as needed  And alternate into soft Benik for wrist instead of wrist brace

## 2015-09-27 ENCOUNTER — Ambulatory Visit: Payer: BC Managed Care – PPO | Admitting: Occupational Therapy

## 2015-09-27 DIAGNOSIS — M79601 Pain in right arm: Secondary | ICD-10-CM

## 2015-09-27 DIAGNOSIS — M6281 Muscle weakness (generalized): Secondary | ICD-10-CM

## 2015-09-27 DIAGNOSIS — M25641 Stiffness of right hand, not elsewhere classified: Secondary | ICD-10-CM

## 2015-09-27 NOTE — Therapy (Signed)
[place] [facility] [address] [place], [address] Phone: [phone]   Fax:  [phone]  Occupational Therapy Treatment  Patient Details  Name: Martha Whitney MRN: [id number] Date of Birth: 12-18-1965 Referring Provider: Reche Dixon  Encounter Date: 09/27/2015      OT End of Session - 09/27/15 1049    Visit Number 9   Number of Visits 11   Date for OT Re-Evaluation 10/14/15   OT Start Time 1037   OT Stop Time 1133   OT Time Calculation (min) 56 min   Activity Tolerance Patient tolerated treatment well   Behavior During Therapy [facility] for tasks assessed/performed      Past Medical History:  Diagnosis Date  . Chronic kidney disease    kidney stones  . Endometriosis determined by laparoscopy   . Restless leg syndrome 2007    Past Surgical History:  Procedure Laterality Date  . ABDOMINAL HYSTERECTOMY    . CARPAL TUNNEL RELEASE Left 2003  . CHOLECYSTECTOMY    . CYSTECTOMY N/A 1969   cyst removed from throat  . GANGLION CYST EXCISION Left 10/21/2014   Procedure: REMOVAL GANGLION OF WRIST;  Surgeon: Hessie Knows, MD;  Location: [facility] ORS;  Service: Orthopedics;  Laterality: Left;  . LAPAROSCOPIC SALPINGO OOPHERECTOMY Bilateral 06/22/2014   Procedure: LAPAROSCOPIC SALPINGO OOPHORECTOMY;  Surgeon: Aletha Halim, MD;  Location: [facility] ORS;  Service: Gynecology;  Laterality: Bilateral;  . LAPAROSCOPIC TOTAL HYSTERECTOMY    . LAPAROSCOPY WITH TUBAL LIGATION    . LEEP N/A 1995  . TONSILLECTOMY      There were no vitals filed for this visit.      Subjective Assessment - 09/27/15 1046    Subjective  My shoulder I had bursitis before - and then also little spur in elbow that is bothered more by my elbow - had to do ironing last night and that bothered my arm - I think last week the  vacuuming bothered it    Patient Stated Goals I want the pain better - it hurts and I cannot use my R hand , typing , writing is  hard -    Pain Score 3    Pain Location Elbow   Pain Orientation Right   Pain Descriptors / Indicators Aching                      OT Treatments/Exercises (OP) - 09/27/15 0001      Moist Heat Therapy   Number Minutes Moist Heat 10 Minutes   Moist Heat Location Elbow      No pain increase at elbow with middle finger Ext but pull with wrist extention resistance in forearm Increase tenderness ll around elbow and lateral epicondyle  Graston tools for soft tissue mobs to dorsal forearm With focus more proximal - and around areas surrounding lateral epicondyle- used tool 2 and 4 for sweeping and brushing     Wrist extensors stretch - elbow extended AROM in neutral and pronation - open hand  HOLD off on close fist stretch 5 reps hold 5 sec  Ice done 5 min after manual prior to ionto  Iontophoresis with dexamethazone at 1.4 gradually increase 1x until got to 1.8 - medium patch over lateral epicondyle Assess 1 x if can increase current - skin check at start and pt to keep patch on for a hour             OT Education - 09/27/15  1048    Education provided Yes   Education Details HEP changes   Person(s) Educated Patient   Methods Explanation;Demonstration;Tactile cues;Verbal cues   Comprehension Verbalized understanding;Returned demonstration;Verbal cues required          OT Short Term Goals - 09/23/15 1355      OT SHORT TERM GOAL #1   Title Pain in R hand and forearm  on PRWHE improve by at least 20 points    Baseline Pain at eval on PRWHE 46/50 and now 28/50   Time 3   Period Weeks   Status On-going           OT Long Term Goals - 09/23/15 1356      OT LONG TERM GOAL #1   Title Function in R hand and forearm on PRHWE  improve with 20 points    Baseline at eval 45/50  for function on PRWHE; 25/50 now    Status Achieved     OT LONG TERM GOAL #2   Title Pt show AROM in R hand and elbow WFL to perform work and ADL's without pain     Baseline R hand pain great but increase pain still at elbow - with ADL's and work   Time 3   Period Weeks   Status On-going     OT LONG TERM GOAL #3   Title Function on PREE improve with 10 points    Baseline Function at eval 45/50 ,  on 09/23/15 it is 25/50   Time 3   Period Weeks   Status On-going               Plan - 09/27/15 1133    Clinical Impression Statement Pt pain in forearm better this date -but had some pain up in upper arm - pt has history of bursitis in shoulder - pt to cont with HEP and soft wrist splint - 50% of time    Rehab Potential Good   OT Frequency 2x / week   OT Duration 2 weeks   OT Treatment/Interventions Self-care/ADL training;Contrast Bath;Iontophoresis;Therapeutic exercise;Patient/family education;Splinting;Manual Therapy;Parrafin;DME and/or AE instruction;Passive range of motion   Plan assess pain in forearm and shoulder    OT Home Exercise Plan see pt instruction   Consulted and Agree with Plan of Care Patient      Patient will benefit from skilled therapeutic intervention in order to improve the following deficits and impairments:  Decreased coordination, Decreased range of motion, Impaired flexibility, Increased edema, Pain, Impaired UE functional use, Decreased strength, Abnormal gait  Visit Diagnosis: Pain In Right Arm  Stiffness of right hand, not elsewhere classified  Muscle weakness (generalized)    Problem List Patient Active Problem List   Diagnosis Date Noted  . CAP (community acquired pneumonia) 05/23/2015    Rosalyn Gess OTR/L,CLT  09/27/2015, 3:01 PM  [place] [facility] [address] [place], [address] Phone: [phone]   Fax:  [phone]  Name: Martha Whitney MRN: [id number] Date of Birth: [birth year]

## 2015-09-27 NOTE — Patient Instructions (Addendum)
Same HEP  

## 2015-09-29 ENCOUNTER — Ambulatory Visit: Payer: BC Managed Care – PPO | Admitting: Occupational Therapy

## 2015-09-29 DIAGNOSIS — M79601 Pain in right arm: Secondary | ICD-10-CM | POA: Diagnosis not present

## 2015-09-29 DIAGNOSIS — M25641 Stiffness of right hand, not elsewhere classified: Secondary | ICD-10-CM

## 2015-09-29 DIAGNOSIS — M6281 Muscle weakness (generalized): Secondary | ICD-10-CM

## 2015-09-29 NOTE — Therapy (Signed)
[place] [facility] [address] [place], [address] Phone: [phone]   Fax:  [phone]  Occupational Therapy Treatment  Patient Details  Name: Martha Whitney MRN: [id number] Date of Birth: 09/14/1965 Referring Provider: Reche Dixon  Encounter Date: 09/29/2015      OT End of Session - 09/29/15 1705    Visit Number 10   Number of Visits 11   Date for OT Re-Evaluation 10/14/15   OT Start Time 1115   OT Stop Time 1212   OT Time Calculation (min) 57 min   Activity Tolerance Patient tolerated treatment well   Behavior During Therapy [facility] for tasks assessed/performed      Past Medical History:  Diagnosis Date  . Chronic kidney disease    kidney stones  . Endometriosis determined by laparoscopy   . Restless leg syndrome 2007    Past Surgical History:  Procedure Laterality Date  . ABDOMINAL HYSTERECTOMY    . CARPAL TUNNEL RELEASE Left 2003  . CHOLECYSTECTOMY    . CYSTECTOMY N/A 1969   cyst removed from throat  . GANGLION CYST EXCISION Left 10/21/2014   Procedure: REMOVAL GANGLION OF WRIST;  Surgeon: Hessie Knows, MD;  Location: [facility] ORS;  Service: Orthopedics;  Laterality: Left;  . LAPAROSCOPIC SALPINGO OOPHERECTOMY Bilateral 06/22/2014   Procedure: LAPAROSCOPIC SALPINGO OOPHORECTOMY;  Surgeon: Aletha Halim, MD;  Location: [facility] ORS;  Service: Gynecology;  Laterality: Bilateral;  . LAPAROSCOPIC TOTAL HYSTERECTOMY    . LAPAROSCOPY WITH TUBAL LIGATION    . LEEP N/A 1995  . TONSILLECTOMY      There were no vitals filed for this visit.      Subjective Assessment - 09/29/15 1659    Subjective  Doing okay - my shoulder feels little better - I have my massage Saturday looking forward - doing okay - pain with certain act - like ironing , doing my hair, reaching in microwave - cause pain in forearm and elbow - 3-4/10  -  hand doing okay    Patient Stated Goals I want the pain better - it hurts and I cannot  use my R hand , typing , writing is hard -    Pain Score 3    Pain Location Elbow   Pain Descriptors / Indicators Aching   Pain Onset More than a month ago   Pain Frequency Intermittent            [facility] OT Assessment - 09/29/15 0001      Strength   Right Hand Grip (lbs) 36  extended arm 35   Right Hand Lateral Pinch 16 lbs   Right Hand 3 Point Pinch 13 lbs   Left Hand Grip (lbs) 50  extended arm 55   Left Hand Lateral Pinch 18 lbs   Left Hand 3 Point Pinch 14 lbs                  OT Treatments/Exercises (OP) - 09/29/15 0001      Moist Heat Therapy   Number Minutes Moist Heat 10 Minutes   Moist Heat Location Elbow      Grip and prehension  Measured - see flow sheet  No pain increase at elbow with middle finger  Resistance with extention  Light pull in forearm with resistance to wrist extention  Increase tenderness around elbow and lateral epicondyle Increase pain with extended arm and grip   Graston tools for soft tissue mobs to dorsal forearm With focus more  proximal - and around areas surrounding lateral epicondyle- used tool 2 and 4 for sweeping and brushing  Did some gentle traction and joint mobs to elbow and radius head prior to stretch - to decrease pain   Wrist extensors stretch - elbow extended AROM in neutral and pronation - open hand  Ice done 5 min after manual prior to ionto  Iontophoresis with dexamethazone at 0.8 and then 1.6and 2nd time to 1.8 current  - medium patch over lateral epicondyle Assess 2x if can increase current - skin check at start and pt to keep patch on for a hour Little red and skin irritated over lateral epicondyle             OT Education - 09/29/15 1705    Education provided Yes   Education Details HEP    Person(s) Educated Patient   Methods Explanation;Demonstration;Tactile cues;Verbal cues   Comprehension Verbalized understanding;Returned demonstration;Verbal cues required           OT Short Term Goals - 09/23/15 1355      OT SHORT TERM GOAL #1   Title Pain in R hand and forearm  on PRWHE improve by at least 20 points    Baseline Pain at eval on PRWHE 46/50 and now 28/50   Time 3   Period Weeks   Status On-going           OT Long Term Goals - 09/23/15 1356      OT LONG TERM GOAL #1   Title Function in R hand and forearm on PRHWE  improve with 20 points    Baseline at eval 45/50  for function on PRWHE; 25/50 now    Status Achieved     OT LONG TERM GOAL #2   Title Pt show AROM in R hand and elbow WFL to perform work and ADL's without pain    Baseline R hand pain great but increase pain still at elbow - with ADL's and work   Time 3   Period Weeks   Status On-going     OT LONG TERM GOAL #3   Title Function on PREE improve with 10 points    Baseline Function at eval 45/50 ,  on 09/23/15 it is 25/50   Time 3   Period Weeks   Status On-going               Plan - 09/29/15 1708    Clinical Impression Statement Pt made great progress from [facility] - pain and function on PRWHE and PREE improved about 20 points - grip improve with 16 lbs - but cont to have some pain at elbow - pt was sick about 2 wks ago and missed 13 days -  can cont to treat pt and ionto - pt to see MD tomorrow and  will contact this OT if need to cont    Rehab Potential Good   OT Frequency 2x / week   OT Duration 4 weeks   OT Treatment/Interventions Self-care/ADL training;Contrast Bath;Iontophoresis;Therapeutic exercise;Patient/family education;Splinting;Manual Therapy;Parrafin;DME and/or AE instruction;Passive range of motion   Plan Pt to see MD tomorrow- discuss progress and plan - will contact me if need to cont with OT    OT Home Exercise Plan see pt instruction   Consulted and Agree with Plan of Care Patient      Patient will benefit from skilled therapeutic intervention in order to improve the following deficits and impairments:  Decreased coordination, Decreased range of motion,  Impaired flexibility, Increased  edema, Pain, Impaired UE functional use, Decreased strength, Abnormal gait  Visit Diagnosis: Pain In Right Arm  Stiffness of right hand, not elsewhere classified  Muscle weakness (generalized)    Problem List Patient Active Problem List   Diagnosis Date Noted  . CAP (community acquired pneumonia) 05/23/2015    Rosalyn Gess OTR/L,CLT 09/29/2015, 5:12 PM  [place] [facility] [address], [place], [address] Phone: [phone]   Fax:  [phone]  Name: KERIA DLUGOSZ MRN: [id number] Date of Birth: May 18, 1965

## 2015-09-29 NOTE — Patient Instructions (Addendum)
Same HEP as last time  

## 2015-10-06 ENCOUNTER — Ambulatory Visit: Payer: BC Managed Care – PPO | Attending: Orthopedic Surgery | Admitting: Occupational Therapy

## 2015-10-06 DIAGNOSIS — M6281 Muscle weakness (generalized): Secondary | ICD-10-CM | POA: Insufficient documentation

## 2015-10-06 DIAGNOSIS — M79601 Pain in right arm: Secondary | ICD-10-CM | POA: Insufficient documentation

## 2015-10-06 DIAGNOSIS — M25641 Stiffness of right hand, not elsewhere classified: Secondary | ICD-10-CM

## 2015-10-06 NOTE — Patient Instructions (Addendum)
  Wrist extensors stretch - elbow extended AROM in neutral and pronation - open hand - did add gentle PROM for stretch    I

## 2015-10-06 NOTE — Therapy (Signed)
[place] [facility] [address] [place], [address] Phone: [phone]   Fax:  [phone]  Occupational Therapy Treatment  Patient Details  Name: FRITZIE LINDH MRN: [id number] Date of Birth: October 01, 1965 Referring Provider: Reche Dixon  Encounter Date: 10/06/2015      OT End of Session - 10/06/15 1059    Visit Number 11   Number of Visits 17   Date for OT Re-Evaluation 10/27/15   OT Start Time 1045   OT Stop Time 1149   OT Time Calculation (min) 64 min   Activity Tolerance Patient tolerated treatment well   Behavior During Therapy [facility] for tasks assessed/performed      Past Medical History:  Diagnosis Date  . Chronic kidney disease    kidney stones  . Endometriosis determined by laparoscopy   . Restless leg syndrome 2007    Past Surgical History:  Procedure Laterality Date  . ABDOMINAL HYSTERECTOMY    . CARPAL TUNNEL RELEASE Left 2003  . CHOLECYSTECTOMY    . CYSTECTOMY N/A 1969   cyst removed from throat  . GANGLION CYST EXCISION Left 10/21/2014   Procedure: REMOVAL GANGLION OF WRIST;  Surgeon: Hessie Knows, MD;  Location: [facility] ORS;  Service: Orthopedics;  Laterality: Left;  . LAPAROSCOPIC SALPINGO OOPHERECTOMY Bilateral 06/22/2014   Procedure: LAPAROSCOPIC SALPINGO OOPHORECTOMY;  Surgeon: Aletha Halim, MD;  Location: [facility] ORS;  Service: Gynecology;  Laterality: Bilateral;  . LAPAROSCOPIC TOTAL HYSTERECTOMY    . LAPAROSCOPY WITH TUBAL LIGATION    . LEEP N/A 1995  . TONSILLECTOMY      There were no vitals filed for this visit.      Subjective Assessment - 10/06/15 1044    Subjective  Seen Dr Prescott Parma - he said to cont therapy for 3 wks - doing better - range and pain better - but like taking tea pitcher and reaching in microwave - but strenght not just pain    Patient Stated Goals I want the pain better - it hurts and I cannot use my R hand , typing , writing is hard -    Currently in Pain? Yes    Pain Score 3    Pain Location Elbow   Pain Orientation Right   Pain Descriptors / Indicators Aching   Pain Type Chronic pain   Pain Onset More than a month ago                      OT Treatments/Exercises (OP) - 10/06/15 0001      Moist Heat Therapy   Number Minutes Moist Heat 10 Minutes   Moist Heat Location Elbow      Light pull in forearm with resistance to wrist extention and MF extention  Increase tenderness around elbow and lateral epicondyle - but more medial  I  After heat to elbow  Did static US at 50% , 1.0 intensity , 3.3MHZ over trigger point  Just medial of lateral epicondyle  30min   Graston tools for soft tissue mobs to dorsal forearm With focus more proximal - and around areas surrounding lateral epicondyle- used tool 2 and 4 for sweeping and brushing  Did some gentle traction and joint mobs to elbow and radius head prior to stretch - to decrease pain   Wrist extensors stretch - elbow extended AROM in neutral and pronation - open hand - did add gentle PROM for stretch    Iontophoresis with dexamethazone at 0.5 current started  out and needed 3 visits to increase to 1.8  - medium patch area tender medial to  lateral epicondyle Skin check at start and pt to keep patch on for a hour             OT Education - 10/06/15 1059    Education provided Yes   Education Details HEP   Person(s) Educated Patient   Methods Explanation;Demonstration;Tactile cues;Verbal cues   Comprehension Verbal cues required;Returned demonstration;Verbalized understanding          OT Short Term Goals - 09/23/15 1355      OT SHORT TERM GOAL #1   Title Pain in R hand and forearm  on PRWHE improve by at least 20 points    Baseline Pain at eval on PRWHE 46/50 and now 28/50   Time 3   Period Weeks   Status On-going           OT Long Term Goals - 09/23/15 1356      OT LONG TERM GOAL #1   Title Function in R hand and forearm on PRHWE   improve with 20 points    Baseline at eval 45/50  for function on PRWHE; 25/50 now    Status Achieved     OT LONG TERM GOAL #2   Title Pt show AROM in R hand and elbow WFL to perform work and ADL's without pain    Baseline R hand pain great but increase pain still at elbow - with ADL's and work   Time 3   Period Weeks   Status On-going     OT LONG TERM GOAL #3   Title Function on PREE improve with 10 points    Baseline Function at eval 45/50 ,  on 09/23/15 it is 25/50   Time 3   Period Weeks   Status On-going               Plan - 10/06/15 1100    Clinical Impression Statement Pt making progerss pain more medial to lateral epicondyle - but cont to have some pain  but also more over forearm and report weak feeling -  recieved new order to cont 2 x 3 more wks    Rehab Potential Good   OT Frequency 2x / week   OT Duration 4 weeks   OT Treatment/Interventions Self-care/ADL training;Contrast Bath;Iontophoresis;Therapeutic exercise;Patient/family education;Splinting;Manual Therapy;Parrafin;DME and/or AE instruction;Passive range of motion   Plan cont 2 x 3wks - assess progress    OT Home Exercise Plan see pt instruction   Consulted and Agree with Plan of Care Patient      Patient will benefit from skilled therapeutic intervention in order to improve the following deficits and impairments:  Decreased coordination, Decreased range of motion, Impaired flexibility, Increased edema, Pain, Impaired UE functional use, Decreased strength, Abnormal gait  Visit Diagnosis: Pain in right arm - Plan: Ot plan of care cert/re-cert  Stiffness of right hand, not elsewhere classified - Plan: Ot plan of care cert/re-cert  Muscle weakness (generalized) - Plan: Ot plan of care cert/re-cert    Problem List Patient Active Problem List   Diagnosis Date Noted  . CAP (community acquired pneumonia) 05/23/2015    Rosalyn Gess OTR/L,CLT  10/06/2015, 12:54 PM  [place] [facility] [address], [place], [address] Phone: [phone]   Fax:  [phone]  Name: ASTACIA RUSCHE MRN: [id number] Date of Birth: [birth year]

## 2015-10-11 ENCOUNTER — Ambulatory Visit: Payer: BC Managed Care – PPO | Admitting: Occupational Therapy

## 2015-10-11 DIAGNOSIS — M25641 Stiffness of right hand, not elsewhere classified: Secondary | ICD-10-CM

## 2015-10-11 DIAGNOSIS — M6281 Muscle weakness (generalized): Secondary | ICD-10-CM

## 2015-10-11 DIAGNOSIS — M79601 Pain in right arm: Secondary | ICD-10-CM | POA: Diagnosis not present

## 2015-10-11 NOTE — Patient Instructions (Addendum)
  After heat to elbow  Massage over forearm and elbow Wrist extensors stretch - elbow extended AROM in neutral and pronation - open hand - cont gentle PROM for stretch - needed min A for correct hand position    I

## 2015-10-11 NOTE — Therapy (Signed)
[place] [facility] [address] [place], [address] Phone: [phone]   Fax:  [phone]  Occupational Therapy Treatment  Patient Details  Name: Martha Whitney MRN: [id number] Date of Birth: 07/01/1965 Referring Provider: Reche Dixon  Encounter Date: 10/11/2015      OT End of Session - 10/11/15 1136    Visit Number 12   Number of Visits 17   Date for OT Re-Evaluation 10/27/15   OT Start Time 1125   OT Stop Time 1218   OT Time Calculation (min) 53 min   Activity Tolerance Patient tolerated treatment well   Behavior During Therapy [facility] for tasks assessed/performed      Past Medical History:  Diagnosis Date  . Chronic kidney disease    kidney stones  . Endometriosis determined by laparoscopy   . Restless leg syndrome 2007    Past Surgical History:  Procedure Laterality Date  . ABDOMINAL HYSTERECTOMY    . CARPAL TUNNEL RELEASE Left 2003  . CHOLECYSTECTOMY    . CYSTECTOMY N/A 1969   cyst removed from throat  . GANGLION CYST EXCISION Left 10/21/2014   Procedure: REMOVAL GANGLION OF WRIST;  Surgeon: Hessie Knows, MD;  Location: [facility] ORS;  Service: Orthopedics;  Laterality: Left;  . LAPAROSCOPIC SALPINGO OOPHERECTOMY Bilateral 06/22/2014   Procedure: LAPAROSCOPIC SALPINGO OOPHORECTOMY;  Surgeon: Aletha Halim, MD;  Location: [facility] ORS;  Service: Gynecology;  Laterality: Bilateral;  . LAPAROSCOPIC TOTAL HYSTERECTOMY    . LAPAROSCOPY WITH TUBAL LIGATION    . LEEP N/A 1995  . TONSILLECTOMY      There were no vitals filed for this visit.      Subjective Assessment - 10/11/15 1132    Subjective  My arm been much better since last time - did not bother me much - maybe about 1-2/10   Patient Stated Goals I want the pain better - it hurts and I cannot use my R hand , typing , writing is hard -    Currently in Pain? Yes   Pain Score 1    Pain Location Wrist   Pain Orientation Right   Pain Descriptors /  Indicators Aching   Pain Onset More than a month ago                      OT Treatments/Exercises (OP) - 10/11/15 0001      Moist Heat Therapy   Number Minutes Moist Heat 10 Minutes   Moist Heat Location Elbow    No pull or pain  in forearm with resistance to wrist extention and MF extention  This date Tenderness around elbow and lateral epicondyle better - but still mostly  medial to lateral epicondyle I  After heat to elbow  Did static US at 50% , 1.0 intensity , 3.3MHZ over trigger point  Just medial of lateral epicondyle  68min   Graston tools for soft tissue mobs to dorsal forearm With focus more proximal - and around areas surrounding lateral epicondyle- used tool 2 and 4 for sweeping and brushing  Less tenderness this date on forearm - and even at lateral epicondyle  Did some gentle traction and joint mobs to elbow and radius head prior to stretch - to decrease pain   Wrist extensors stretch - elbow extended AROM in neutral and pronation - open hand - cont gentle PROM for stretch - needed min A for correct hand position    Iontophoresis with dexamethazone at 1.4  current started out and needed 1  visits to increase to 1.8 - medium patch area tender medial to  lateral epicondyle Skin check at start and pt to keep patch on for a hour              OT Education - 10/11/15 1136    Education provided Yes   Education Details HEP   Person(s) Educated Patient   Methods Explanation;Demonstration;Tactile cues;Verbal cues   Comprehension Verbal cues required;Returned demonstration;Verbalized understanding          OT Short Term Goals - 09/23/15 1355      OT SHORT TERM GOAL #1   Title Pain in R hand and forearm  on PRWHE improve by at least 20 points    Baseline Pain at eval on PRWHE 46/50 and now 28/50   Time 3   Period Weeks   Status On-going           OT Long Term Goals - 09/23/15 1356      OT LONG TERM GOAL #1   Title  Function in R hand and forearm on PRHWE  improve with 20 points    Baseline at eval 45/50  for function on PRWHE; 25/50 now    Status Achieved     OT LONG TERM GOAL #2   Title Pt show AROM in R hand and elbow WFL to perform work and ADL's without pain    Baseline R hand pain great but increase pain still at elbow - with ADL's and work   Time 3   Period Weeks   Status On-going     OT LONG TERM GOAL #3   Title Function on PREE improve with 10 points    Baseline Function at eval 45/50 ,  on 09/23/15 it is 25/50   Time 3   Period Weeks   Status On-going               Plan - 10/11/15 1137    Clinical Impression Statement Pt made progress since last time - did [place] static over triggerpoint - able to tolerate ionto better this date - and less tenderness and pain over forearm and elbow - cont to decrease pain    Rehab Potential Good   OT Frequency 2x / week   OT Duration 4 weeks   OT Treatment/Interventions Self-care/ADL training;Contrast Bath;Iontophoresis;Therapeutic exercise;Patient/family education;Splinting;Manual Therapy;Parrafin;DME and/or AE instruction;Passive range of motion   Plan static US prior to manual    OT Home Exercise Plan see pt instruction   Consulted and Agree with Plan of Care Patient      Patient will benefit from skilled therapeutic intervention in order to improve the following deficits and impairments:  Decreased coordination, Decreased range of motion, Impaired flexibility, Increased edema, Pain, Impaired UE functional use, Decreased strength, Abnormal gait  Visit Diagnosis: Pain in right arm  Stiffness of right hand, not elsewhere classified  Muscle weakness (generalized)    Problem List Patient Active Problem List   Diagnosis Date Noted  . CAP (community acquired pneumonia) 05/23/2015    Rosalyn Gess OTR/L,CLT 10/11/2015, 12:08 PM  [place] [facility] [address], [place], [address] Phone: [phone]   Fax:  [phone]  Name: Martha Whitney MRN: [id number] Date of Birth: 06/01/1965

## 2015-10-13 ENCOUNTER — Ambulatory Visit: Payer: BC Managed Care – PPO | Admitting: Occupational Therapy

## 2015-10-13 DIAGNOSIS — M79601 Pain in right arm: Secondary | ICD-10-CM | POA: Diagnosis not present

## 2015-10-13 DIAGNOSIS — M25641 Stiffness of right hand, not elsewhere classified: Secondary | ICD-10-CM

## 2015-10-13 DIAGNOSIS — M6281 Muscle weakness (generalized): Secondary | ICD-10-CM

## 2015-10-13 NOTE — Patient Instructions (Addendum)
Wrist extensors stretch - elbow extended GENTLE AROM in neutral and pronation - open hand - gentle PROM for stretch -reviewed again - not to force active flexion for medial epicondyle pain - needed min A for correct hand position

## 2015-10-13 NOTE — Therapy (Signed)
[place] [facility] [address], [place], [address] Phone: [phone]   Fax:  [phone]  Occupational Therapy Treatment  Patient Details  Name: Martha Whitney MRN: [id number] Date of Birth: [birth year] Referring Provider: Reche Dixon  Encounter Date: 10/13/2015      OT End of Session - 10/13/15 1257    Visit Number 13   Number of Visits 17   Date for OT Re-Evaluation 10/27/15   OT Start Time 1120   OT Stop Time 1224   OT Time Calculation (min) 64 min   Activity Tolerance Patient tolerated treatment well   Behavior During Therapy [facility] for tasks assessed/performed      Past Medical History:  Diagnosis Date  . Chronic kidney disease    kidney stones  . Endometriosis determined by laparoscopy   . Restless leg syndrome 2007    Past Surgical History:  Procedure Laterality Date  . ABDOMINAL HYSTERECTOMY    . CARPAL TUNNEL RELEASE Left 2003  . CHOLECYSTECTOMY    . CYSTECTOMY N/A 1969   cyst removed from throat  . GANGLION CYST EXCISION Left 10/21/2014   Procedure: REMOVAL GANGLION OF WRIST;  Surgeon: Hessie Knows, MD;  Location: [facility] ORS;  Service: Orthopedics;  Laterality: Left;  . LAPAROSCOPIC SALPINGO OOPHERECTOMY Bilateral 06/22/2014   Procedure: LAPAROSCOPIC SALPINGO OOPHORECTOMY;  Surgeon: Aletha Halim, MD;  Location: [facility] ORS;  Service: Gynecology;  Laterality: Bilateral;  . LAPAROSCOPIC TOTAL HYSTERECTOMY    . LAPAROSCOPY WITH TUBAL LIGATION    . LEEP N/A 1995  . TONSILLECTOMY      There were no vitals filed for this visit.      Subjective Assessment - 10/13/15 1213    Subjective  Doing okay - but that inside of my elbow bothering me some -DR told me months ago - I have bone spur there - it just got tender the last week or 2    Patient Stated Goals I want the pain better - it hurts and I cannot use my R hand , typing , writing is hard -    Currently in Pain? Yes   Pain Score 2    Pain  Location Elbow   Pain Orientation Right   Pain Descriptors / Indicators Aching   Pain Type Chronic pain   Pain Onset More than a month ago                      OT Treatments/Exercises (OP) - 10/13/15 0001      Moist Heat Therapy   Number Minutes Moist Heat 10 Minutes   Moist Heat Location Elbow      No pull or pain  in forearm with resistance to wrist extention and MF extention  This date Tenderness around elbow and lateral epicondyle better - but still mostly  medial to lateral epicondyle And over medial epicondyle tenderness -  I  After heat to elbow  Did static US at 50% , 1.0 intensity , 3.3MHZ over trigger point Just medial of lateral epicondyle  31min  Graston tools for soft tissue mobs to dorsal forearm With focus more proximal - and around areas surrounding lateral epicondyle- used tool 2 and 4 for sweeping and brushing  Less tenderness this date on forearm - and even at lateral epicondyle  Did some gentle traction and joint mobs to elbow and radius head prior to stretch - to decrease pain   Wrist extensors stretch - elbow extended GENTLE  AROM in neutral and pronation - open hand - gentle PROM for stretch -reviewed again - not to force active flexion for medial epicondyle pain - needed min A for correct hand position    Iontophoresis with dexamethazone at 1.4  current started out and needed 1  visits to increase to 1.8 - medium patch area tender medial to lateral epicondyle and one this date over medial epicondyle too  Skin check at start and pt to keep patch on for a hour              OT Education - 10/13/15 1257    Education provided Yes   Education Details HEP -    Person(s) Educated Patient   Methods Explanation;Tactile cues;Verbal cues;Demonstration   Comprehension Verbalized understanding;Returned demonstration;Verbal cues required          OT Short Term Goals - 09/23/15 1355      OT SHORT TERM GOAL #1   Title  Pain in R hand and forearm  on PRWHE improve by at least 20 points    Baseline Pain at eval on PRWHE 46/50 and now 28/50   Time 3   Period Weeks   Status On-going           OT Long Term Goals - 09/23/15 1356      OT LONG TERM GOAL #1   Title Function in R hand and forearm on PRHWE  improve with 20 points    Baseline at eval 45/50  for function on PRWHE; 25/50 now    Status Achieved     OT LONG TERM GOAL #2   Title Pt show AROM in R hand and elbow WFL to perform work and ADL's without pain    Baseline R hand pain great but increase pain still at elbow - with ADL's and work   Time 3   Period Weeks   Status On-going     OT LONG TERM GOAL #3   Title Function on PREE improve with 10 points    Baseline Function at eval 45/50 ,  on 09/23/15 it is 25/50   Time 3   Period Weeks   Status On-going               Plan - 10/13/15 1258    Clinical Impression Statement Pt tenderness over medial epicondyle - - pain improving  over lateral epicondyle - since [place] - pt to not force AROM with stretch for extended arm and wrist flexion - add gentle pasive stretch    Rehab Potential Good   OT Frequency 2x / week   OT Duration 4 weeks   OT Treatment/Interventions Self-care/ADL training;Contrast Bath;Iontophoresis;Therapeutic exercise;Patient/family education;Splinting;Manual Therapy;Parrafin;DME and/or AE instruction;Passive range of motion   Plan cont static US - assess if medial epicondyle pain better    OT Home Exercise Plan see pt instruction   Consulted and Agree with Plan of Care Patient      Patient will benefit from skilled therapeutic intervention in order to improve the following deficits and impairments:  Decreased coordination, Decreased range of motion, Impaired flexibility, Increased edema, Pain, Impaired UE functional use, Decreased strength, Abnormal gait  Visit Diagnosis: Pain in right arm  Stiffness of right hand, not elsewhere classified  Muscle weakness  (generalized)    Problem List Patient Active Problem List   Diagnosis Date Noted  . CAP (community acquired pneumonia) 05/23/2015    Rosalyn Gess OTR/L,CLT 10/13/2015, 1:03 PM  [place] [facility] [address] [place], [address] Phone: [phone]   Fax:  [phone]  Name: Martha Whitney MRN: [id number] Date of Birth: 1965/10/29

## 2015-10-18 ENCOUNTER — Ambulatory Visit: Payer: BC Managed Care – PPO | Admitting: Occupational Therapy

## 2015-10-18 DIAGNOSIS — M6281 Muscle weakness (generalized): Secondary | ICD-10-CM

## 2015-10-18 DIAGNOSIS — M79601 Pain in right arm: Secondary | ICD-10-CM

## 2015-10-18 DIAGNOSIS — M25641 Stiffness of right hand, not elsewhere classified: Secondary | ICD-10-CM

## 2015-10-18 NOTE — Patient Instructions (Signed)
Same HEP  

## 2015-10-18 NOTE — Therapy (Signed)
[place] [facility] [address], [place], [address] Phone: [phone]   Fax:  [phone]  Occupational Therapy Treatment  Patient Details  Name: Martha Whitney MRN: [id number] Date of Birth: 1965/01/21 Referring Provider: Reche Dixon  Encounter Date: 10/18/2015      OT End of Session - 10/18/15 1652    Visit Number 14   Number of Visits 17   Date for OT Re-Evaluation 10/27/15   OT Start Time 1120   OT Stop Time 1215   OT Time Calculation (min) 55 min   Activity Tolerance Patient tolerated treatment well   Behavior During Therapy [facility] for tasks assessed/performed      Past Medical History:  Diagnosis Date  . Chronic kidney disease    kidney stones  . Endometriosis determined by laparoscopy   . Restless leg syndrome 2007    Past Surgical History:  Procedure Laterality Date  . ABDOMINAL HYSTERECTOMY    . CARPAL TUNNEL RELEASE Left 2003  . CHOLECYSTECTOMY    . CYSTECTOMY N/A 1969   cyst removed from throat  . GANGLION CYST EXCISION Left 10/21/2014   Procedure: REMOVAL GANGLION OF WRIST;  Surgeon: Hessie Knows, MD;  Location: [facility] OR[facility];  Service: Orthopedics;  Laterality: Left;  . LAPAROSCOPIC SALPINGO OOPHERECTOMY Bilateral 06/22/2014   Procedure: LAPAROSCOPIC SALPINGO OOPHORECTOMY;  Surgeon: Aletha Halim, MD;  Location: [facility] ORS;  Service: Gynecology;  Laterality: Bilateral;  . LAPAROSCOPIC TOTAL HYSTERECTOMY    . LAPAROSCOPY WITH TUBAL LIGATION    . LEEP N/A 1995  . TONSILLECTOMY      There were no vitals filed for this visit.      Subjective Assessment - 10/18/15 1651    Subjective  Was doing good over the weekend - inside of elbow some what better but still tender - and then elbow better - about 2/10 -dependence what I do at work - but better than last week    Patient Stated Goals I want the pain better - it hurts and I cannot use my R hand , typing , writing is hard -    Currently in  Pain? Yes   Pain Score 2    Pain Location Elbow   Pain Orientation Right   Pain Descriptors / Indicators Aching                      OT Treatments/Exercises (OP) - 10/18/15 0001      Moist Heat Therapy   Number Minutes Moist Heat 10 Minutes   Moist Heat Location Elbow      No pull or pain in forearm with resistance to wrist extention and MF extention This date Tenderness around elbow and lateral epicondyle better - but still mostly medial to lateral epicondyle And distal to  medial epicondyle tenderness - but better than last week  I  After heat to elbow  Did static US at 50% , 1.0 intensity , 3.3MHZ over trigger point Just medial of lateral epicondyle and distal to medial epicondyle - 3 min each - 6 min total   Graston tools for soft tissue mobs to dorsal forearm With focus more proximal - and around areas surrounding lateral epicondyle- used tool 2 and 4 for sweeping and brushing  Less tenderness this date on forearm - and even at lateral epicondyle  Did some gentle traction and joint mobs to elbow and radius head prior to stretch - to decrease pain   Wrist extensors stretch -  Iontophoresis with dexamethazone at 1.4 current started out and needed 1 visits to increase to 1.8 - medium patch area tender medial to lateral epicondyle and one this date over medial epicondyle too  Skin check at start and pt to keep patch on for a hour            OT Education - 10/18/15 1652    Education provided Yes   Education Details progress discuss   Person(s) Educated Patient   Methods Explanation;Demonstration;Tactile cues;Verbal cues   Comprehension Verbal cues required;Returned demonstration;Verbalized understanding          OT Short Term Goals - 09/23/15 1355      OT SHORT TERM GOAL #1   Title Pain in R hand and forearm  on PRWHE improve by at least 20 points    Baseline Pain at eval on PRWHE 46/50 and now 28/50   Time 3    Period Weeks   Status On-going           OT Long Term Goals - 09/23/15 1356      OT LONG TERM GOAL #1   Title Function in R hand and forearm on PRHWE  improve with 20 points    Baseline at eval 45/50  for function on PRWHE; 25/50 now    Status Achieved     OT LONG TERM GOAL #2   Title Pt show AROM in R hand and elbow WFL to perform work and ADL's without pain    Baseline R hand pain great but increase pain still at elbow - with ADL's and work   Time 3   Period Weeks   Status On-going     OT LONG TERM GOAL #3   Title Function on PREE improve with 10 points    Baseline Function at eval 45/50 ,  on 09/23/15 it is 25/50   Time 3   Period Weeks   Status On-going               Plan - 10/18/15 1653    Clinical Impression Statement Pt tenderness improve but still trigger point at lateral epicondyle and distal to medial epicondyle - did [place] static prior to manual therapy - cont to do stretches at home    Rehab Potential Good   OT Frequency 2x / week   OT Duration 2 weeks   OT Treatment/Interventions Self-care/ADL training;Contrast Bath;Iontophoresis;Therapeutic exercise;Patient/family education;Splinting;Manual Therapy;Parrafin;DME and/or AE instruction;Passive range of motion   Plan cont static US and ionto   OT Home Exercise Plan see pt instruction   Consulted and Agree with Plan of Care Patient      Patient will benefit from skilled therapeutic intervention in order to improve the following deficits and impairments:  Decreased coordination, Decreased range of motion, Impaired flexibility, Increased edema, Pain, Impaired UE functional use, Decreased strength, Abnormal gait  Visit Diagnosis: Pain in right arm  Stiffness of right hand, not elsewhere classified  Muscle weakness (generalized)    Problem List Patient Active Problem List   Diagnosis Date Noted  . CAP (community acquired pneumonia) 05/23/2015    Rosalyn Gess OTR/L,CLT 10/18/2015, 4:56  PM  [place] [facility] [address] [place], [address] Phone: [phone]   Fax:  [phone]  Name: Martha Whitney MRN: [id number] Date of Birth: 04/23/1965

## 2015-10-20 ENCOUNTER — Ambulatory Visit: Payer: BC Managed Care – PPO | Admitting: Occupational Therapy

## 2015-10-20 DIAGNOSIS — M25641 Stiffness of right hand, not elsewhere classified: Secondary | ICD-10-CM

## 2015-10-20 DIAGNOSIS — M6281 Muscle weakness (generalized): Secondary | ICD-10-CM

## 2015-10-20 DIAGNOSIS — M79601 Pain in right arm: Secondary | ICD-10-CM | POA: Diagnosis not present

## 2015-10-20 NOTE — Patient Instructions (Signed)
Same HEP  

## 2015-10-20 NOTE — Therapy (Signed)
[place] [facility] [address], [place], [address] Phone: [phone]   Fax:  [phone]  Occupational Therapy Treatment  Patient Details  Name: ALEENE DURAKOVIC MRN: [id number] Date of Birth: 1965-05-22 Referring Provider: Reche Dixon  Encounter Date: 10/20/2015      OT End of Session - 10/20/15 1313    Visit Number 15   Number of Visits 17   Date for OT Re-Evaluation 10/27/15   OT Start Time 1119   OT Stop Time 1219   OT Time Calculation (min) 60 min   Activity Tolerance Patient tolerated treatment well   Behavior During Therapy [facility] for tasks assessed/performed      Past Medical History:  Diagnosis Date  . Chronic kidney disease    kidney stones  . Endometriosis determined by laparoscopy   . Restless leg syndrome 2007    Past Surgical History:  Procedure Laterality Date  . ABDOMINAL HYSTERECTOMY    . CARPAL TUNNEL RELEASE Left 2003  . CHOLECYSTECTOMY    . CYSTECTOMY N/A 1969   cyst removed from throat  . GANGLION CYST EXCISION Left 10/21/2014   Procedure: REMOVAL GANGLION OF WRIST;  Surgeon: Hessie Knows, MD;  Location: [facility] OR[facility];  Service: Orthopedics;  Laterality: Left;  . LAPAROSCOPIC SALPINGO OOPHERECTOMY Bilateral 06/22/2014   Procedure: LAPAROSCOPIC SALPINGO OOPHORECTOMY;  Surgeon: Aletha Halim, MD;  Location: [facility] ORS;  Service: Gynecology;  Laterality: Bilateral;  . LAPAROSCOPIC TOTAL HYSTERECTOMY    . LAPAROSCOPY WITH TUBAL LIGATION    . LEEP N/A 1995  . TONSILLECTOMY      There were no vitals filed for this visit.      Subjective Assessment - 10/20/15 1309    Subjective  My elbow doing better - pain less than 4  x day - inside of elbow too - only when bending my elbow to push door I feel it    Patient Stated Goals I want the pain better - it hurts and I cannot use my R hand , typing , writing is hard -    Currently in Pain? Yes   Pain Score 2    Pain Location Elbow   Pain  Orientation Right   Pain Descriptors / Indicators Aching   Pain Type Chronic pain                      OT Treatments/Exercises (OP) - 10/20/15 0001      Moist Heat Therapy   Number Minutes Moist Heat 10 Minutes   Moist Heat Location Elbow      distal to  medial epicondyle tenderness - but better than last week  And trigger point on medial side of lateral epicondyle -tender  I  After heat to elbow  Did static US at 50% , 1.0 intensity , 3.3MHZ over trigger point Just medial of lateral epicondyle 4 min each    Graston tools for soft tissue mobs to dorsal forearm With focus more proximal - and around areas surrounding lateral epicondyle- used tool 2 and 4 for sweeping and brushing  Less tenderness this date on forearm - and even at lateral epicondyle  Did some gentle traction and joint mobs to elbow and radius head prior to stretch - to decrease pain   Wrist extensors stretch - extended arm - composite flexion wrist and digits -Passively 5 reps each     Iontophoresis with dexamethazone at 0.5current started out and needed 2 visits to increase to  1.5 - medium patch area tender medial to lateral epicondyle and one this date over medial epicondyle too  Skin check at start and pt to keep patch on for a hour           OT Education - 10/20/15 1313    Education provided Yes   Education Details progress and HEP to cont with    Person(s) Educated Patient   Methods Explanation;Verbal cues;Demonstration   Comprehension Verbalized understanding;Returned demonstration          OT Short Term Goals - 09/23/15 1355      OT SHORT TERM GOAL #1   Title Pain in R hand and forearm  on PRWHE improve by at least 20 points    Baseline Pain at eval on PRWHE 46/50 and now 28/50   Time 3   Period Weeks   Status On-going           OT Long Term Goals - 09/23/15 1356      OT LONG TERM GOAL #1   Title Function in R hand and forearm on PRHWE  improve  with 20 points    Baseline at eval 45/50  for function on PRWHE; 25/50 now    Status Achieved     OT LONG TERM GOAL #2   Title Pt show AROM in R hand and elbow WFL to perform work and ADL's without pain    Baseline R hand pain great but increase pain still at elbow - with ADL's and work   Time 3   Period Weeks   Status On-going     OT LONG TERM GOAL #3   Title Function on PREE improve with 10 points    Baseline Function at eval 45/50 ,  on 09/23/15 it is 25/50   Time 3   Period Weeks   Status On-going               Plan - 10/20/15 1313    Clinical Impression Statement Pt cont to show progress in pain in R elbow - cont to use R dominant hand - pt benefit from [place] and joint mobs - response great at trigger point at lateral epicondyle    Rehab Potential Good   OT Frequency 2x / week   OT Duration 2 weeks   OT Treatment/Interventions Self-care/ADL training;Contrast Bath;Iontophoresis;Therapeutic exercise;Patient/family education;Splinting;Manual Therapy;Parrafin;DME and/or AE instruction;Passive range of motion   Plan cont static US and manual therapy    OT Home Exercise Plan see pt instruction   Consulted and Agree with Plan of Care Patient      Patient will benefit from skilled therapeutic intervention in order to improve the following deficits and impairments:  Decreased coordination, Decreased range of motion, Impaired flexibility, Increased edema, Pain, Impaired UE functional use, Decreased strength, Abnormal gait  Visit Diagnosis: Pain in right arm  Stiffness of right hand, not elsewhere classified  Muscle weakness (generalized)    Problem List Patient Active Problem List   Diagnosis Date Noted  . CAP (community acquired pneumonia) 05/23/2015    Rosalyn Gess OTR/L,CLT 10/20/2015, 1:16 PM  [place] [facility] [address] [place], [address] Phone: [phone]   Fax:  [phone]  Name:  CARISMA SCHUCK MRN: [id number] Date of Birth: 11-Oct-1965

## 2015-10-25 ENCOUNTER — Ambulatory Visit: Payer: BC Managed Care – PPO | Admitting: Occupational Therapy

## 2015-10-25 DIAGNOSIS — M25641 Stiffness of right hand, not elsewhere classified: Secondary | ICD-10-CM

## 2015-10-25 DIAGNOSIS — M79601 Pain in right arm: Secondary | ICD-10-CM

## 2015-10-25 DIAGNOSIS — M6281 Muscle weakness (generalized): Secondary | ICD-10-CM

## 2015-10-25 NOTE — Therapy (Signed)
[facility] [address] [place], [address] Phone: [phone]   Fax:  [phone]  Occupational Therapy Treatment  Patient Details  Name: Martha Whitney MRN: [id number] Date of Birth: [birth year] Referring Provider: Reche Dixon  Encounter Date: 10/25/2015      OT End of Session - 10/25/15 1856    Visit Number 16   Number of Visits 17   Date for OT Re-Evaluation 10/27/15   OT Start Time 1122   OT Stop Time 1219   OT Time Calculation (min) 57 min   Activity Tolerance Patient tolerated treatment well   Behavior During Therapy [facility] for tasks assessed/performed      Past Medical History:  Diagnosis Date  . Chronic kidney disease    kidney stones  . Endometriosis determined by laparoscopy   . Restless leg syndrome 2007    Past Surgical History:  Procedure Laterality Date  . ABDOMINAL HYSTERECTOMY    . CARPAL TUNNEL RELEASE Left 2003  . CHOLECYSTECTOMY    . CYSTECTOMY N/A 1969   cyst removed from throat  . GANGLION CYST EXCISION Left 10/21/2014   Procedure: REMOVAL GANGLION OF WRIST;  Surgeon: Hessie Knows, MD;  Location: [facility] ORS;  Service: Orthopedics;  Laterality: Left;  . LAPAROSCOPIC SALPINGO OOPHERECTOMY Bilateral 06/22/2014   Procedure: LAPAROSCOPIC SALPINGO OOPHORECTOMY;  Surgeon: Aletha Halim, MD;  Location: [facility] ORS;  Service: Gynecology;  Laterality: Bilateral;  . LAPAROSCOPIC TOTAL HYSTERECTOMY    . LAPAROSCOPY WITH TUBAL LIGATION    . LEEP N/A 1995  . TONSILLECTOMY      There were no vitals filed for this visit.      Subjective Assessment - 10/25/15 1854    Subjective  My elbow is doing good on the outside - but the inside was bothering me since Friday - really hurting - even when resting it    Patient Stated Goals I want the pain better - it hurts and I cannot use my R hand , typing , writing is hard -    Currently in Pain? Yes   Pain Score 4    Pain Location Elbow   Pain  Orientation Right   Pain Descriptors / Indicators Aching   Pain Type Acute pain                      OT Treatments/Exercises (OP) - 10/25/15 0001      Moist Heat Therapy   Number Minutes Moist Heat 10 Minutes   Moist Heat Location Elbow      Area distal and proximal  medial epicondyle tenderness  And trigger point on volar to medial elbow   I  After heat to elbow  Did static US at 50% , 1.0 intensity , 3.3MHZ over trigger point 5 min  4 min each   Graston tools for soft tissue mobs to medial elbow done - gentle - pt tender  - done tool 2 done for sweeping and scooping   Pt to hold off on wrist extensors stretches  And do elbow to side wrist flexor stretches with forearm in supination and neutral  5 reps hold 5 sec    Iontophoresis with dexamethazone at 0.8 current started out and needed 2 visits to increase to 1.8- medium patch  over medial epicondyle  Skin check at start and pt to keep patch on for a hour Discuss with pt in length - modifications of tasks - and activities or movements that  aggravates  Medial epicondylepain                 OT Education - 10/25/15 1855    Education provided Yes   Education Details HEP for medial epincondylitis  and modifications    Person(s) Educated Patient   Methods Explanation;Demonstration;Tactile cues;Verbal cues   Comprehension Verbalized understanding;Returned demonstration;Verbal cues required          OT Short Term Goals - 09/23/15 1355      OT SHORT TERM GOAL #1   Title Pain in R hand and forearm  on PRWHE improve by at least 20 points    Baseline Pain at eval on PRWHE 46/50 and now 28/50   Time 3   Period Weeks   Status On-going           OT Long Term Goals - 09/23/15 1356      OT LONG TERM GOAL #1   Title Function in R hand and forearm on PRHWE  improve with 20 points    Baseline at eval 45/50  for function on PRWHE; 25/50 now    Status Achieved     OT LONG TERM GOAL  #2   Title Pt show AROM in R hand and elbow WFL to perform work and ADL's without pain    Baseline R hand pain great but increase pain still at elbow - with ADL's and work   Time 3   Period Weeks   Status On-going     OT LONG TERM GOAL #3   Title Function on PREE improve with 10 points    Baseline Function at eval 45/50 ,  on 09/23/15 it is 25/50   Time 3   Period Weeks   Status On-going               Plan - 10/25/15 1857    Clinical Impression Statement Pt show this date tenderness and pain at medial epicodyle - MD did diagnosis her with it - but during treatment of lateral epicondyle that decrease - but since extended stretch for extensors and  weaning out of splint aggrivated her  symptoms again - pt was ed on  HEP to medial epicondyle - and  tasks /movements that aggrivate that     Rehab Potential Good   OT Frequency 2x / week   OT Duration 2 weeks   OT Treatment/Interventions Self-care/ADL training;Contrast Bath;Iontophoresis;Therapeutic exercise;Patient/family education;Splinting;Manual Therapy;Parrafin;DME and/or AE instruction;Passive range of motion   Plan assess if symptoms decrease for medial epicondylitis    OT Home Exercise Plan see pt instruction   Consulted and Agree with Plan of Care Patient      Patient will benefit from skilled therapeutic intervention in order to improve the following deficits and impairments:  Decreased coordination, Decreased range of motion, Impaired flexibility, Increased edema, Pain, Impaired UE functional use, Decreased strength, Abnormal gait  Visit Diagnosis: Stiffness of right hand, not elsewhere classified  Muscle weakness (generalized)  Pain in right arm    Problem List Patient Active Problem List   Diagnosis Date Noted  . CAP (community acquired pneumonia) 05/23/2015    Rosalyn Gess  OTR/L,CLT 10/25/2015, 7:12 PM  [facility] [address], [place], [address] Phone: [phone]   Fax:  [phone]  Name: SCHERRIE BAECHLE MRN: [id number] Date of Birth: February 09, 1965

## 2015-10-25 NOTE — Patient Instructions (Signed)
Heat and massage  Wrist splint on   Pt to hold off on wrist extensors stretches  And do elbow to side wrist flexor stretches with forearm in supination and neutral  5 reps hold 5 sec    Discuss with pt in length - modifications of tasks - and activities or movements that aggravates  Medial epicondylepain

## 2015-10-27 ENCOUNTER — Encounter: Payer: BC Managed Care – PPO | Admitting: Occupational Therapy

## 2015-11-02 ENCOUNTER — Ambulatory Visit: Payer: BC Managed Care – PPO | Admitting: Occupational Therapy

## 2015-11-03 ENCOUNTER — Ambulatory Visit: Payer: BC Managed Care – PPO | Attending: Orthopedic Surgery | Admitting: Occupational Therapy

## 2015-11-03 DIAGNOSIS — M25641 Stiffness of right hand, not elsewhere classified: Secondary | ICD-10-CM | POA: Diagnosis present

## 2015-11-03 DIAGNOSIS — M6281 Muscle weakness (generalized): Secondary | ICD-10-CM

## 2015-11-03 DIAGNOSIS — M79601 Pain in right arm: Secondary | ICD-10-CM

## 2015-11-03 NOTE — Therapy (Signed)
[facility] [address], [place], [address] Phone: [phone]   Fax:  [phone]  Occupational Therapy Treatment  Patient Details  Name: Martha Whitney MRN: [id number] Date of Birth: [birth year] Referring Provider: Reche Dixon  Encounter Date: 11/03/2015      OT End of Session - 11/03/15 1242    Visit Number 17   Number of Visits 18   Date for OT Re-Evaluation 11/28/15   OT Start Time 1115   OT Stop Time 1155   OT Time Calculation (min) 40 min   Activity Tolerance Patient tolerated treatment well   Behavior During Therapy [facility] for tasks assessed/performed      Past Medical History:  Diagnosis Date  . Chronic kidney disease    kidney stones  . Endometriosis determined by laparoscopy   . Restless leg syndrome 2007    Past Surgical History:  Procedure Laterality Date  . ABDOMINAL HYSTERECTOMY    . CARPAL TUNNEL RELEASE Left 2003  . CHOLECYSTECTOMY    . CYSTECTOMY N/A 1969   cyst removed from throat  . GANGLION CYST EXCISION Left 10/21/2014   Procedure: REMOVAL GANGLION OF WRIST;  Surgeon: Hessie Knows, MD;  Location: [facility] ORS;  Service: Orthopedics;  Laterality: Left;  . LAPAROSCOPIC SALPINGO OOPHERECTOMY Bilateral 06/22/2014   Procedure: LAPAROSCOPIC SALPINGO OOPHORECTOMY;  Surgeon: Aletha Halim, MD;  Location: [facility] ORS;  Service: Gynecology;  Laterality: Bilateral;  . LAPAROSCOPIC TOTAL HYSTERECTOMY    . LAPAROSCOPY WITH TUBAL LIGATION    . LEEP N/A 1995  . TONSILLECTOMY      There were no vitals filed for this visit.      Subjective Assessment - 11/03/15 1224    Subjective  Had shot on the inside of my elbow- pain is better - stiil get some soreness in the forearm -did get new wrist splint and wearing it most all the time - except sleeping   Patient Stated Goals I want the pain better - it hurts and I cannot use my R hand , typing , writing is hard -    Currently in Pain? No/denies             [facility] OT Assessment - 11/03/15 0001      Strength   Right Hand Grip (lbs) 50  44   Right Hand Lateral Pinch 16 lbs   Right Hand 3 Point Pinch 15 lbs   Left Hand Grip (lbs) 50  50   Left Hand Lateral Pinch 14 lbs   Left Hand 3 Point Pinch 13 lbs      No pain with tests for lateral epicondylitis Some tenderness at proximal forearm with pronation - but not tenderness like last week - and pain with wrist flexion   Graston tools on volar and dorsal forearm nr 2 and 4 sweeping - did not had any tightness or trigger points   Measurements for grip and prehension assess- grip improved greatly  And still some what decrease extended arm  PREE done pain decrease to 18/50 and function 8/10   Pt to cont for about 4 wks with home program with wrist flexors stretch - elbow to side - if pain free can do extended arm  Cont with splint until pain free and then gradually discharge Pt to cont to modify activities - to not do aggravated  activities or movements  OT Education - 11/03/15 1242    Education provided Yes   Education Details HEP to cont with for 3-4 wks    Person(s) Educated Patient   Methods Explanation;Demonstration;Tactile cues;Verbal cues   Comprehension Verbal cues required;Returned demonstration;Verbalized understanding          OT Short Term Goals - 11/03/15 1300      OT SHORT TERM GOAL #1   Title Pain in R hand and forearm  on PRWHE improve by at least 20 points    Baseline Pain at eval on PRWHE 46/50 and now 18/50   Status Achieved           OT Long Term Goals - 11/03/15 1300      OT LONG TERM GOAL #1   Title Function in R hand and forearm on PRHWE  improve with 20 points    Baseline at eval 45/50  for function on 8/50   Status Achieved     OT LONG TERM GOAL #2   Title Pt show AROM in R hand and elbow WFL to perform work and ADL's without pain    Baseline 1/10 pain at medial epicondyle    Time 4    Period Weeks   Status Partially Met     OT LONG TERM GOAL #3   Title Function on PREE improve with 10 points    Baseline Function at eval 45/50 ,  and now 8/50   Status Achieved               Plan - 11/03/15 1247    Clinical Impression Statement Pt made great progress in pain from [facility] , grip strength and functional use -negative for lateral epicondylitis - still tender with pronation resistance at med epic-  pt medial epicondylitis increase the last 2 wks - had cortizone shot last week - and doing great - pt to cont with HEP for 3-4 wks - and  has follow up appt wth me after that - but can phone and cancel if doing ok    Rehab Potential Good   OT Frequency Monthly   OT Duration 4 weeks   OT Treatment/Interventions Self-care/ADL training;Contrast Bath;Iontophoresis;Therapeutic exercise;Patient/family education;Splinting;Manual Therapy;Parrafin;DME and/or AE instruction;Passive range of motion   Plan pt to to follow up in 3-4 wks - if painfree,  can cancel    OT Home Exercise Plan see pt instruction   Consulted and Agree with Plan of Care Patient      Patient will benefit from skilled therapeutic intervention in order to improve the following deficits and impairments:  Decreased coordination, Decreased range of motion, Impaired flexibility, Increased edema, Pain, Impaired UE functional use, Decreased strength, Abnormal gait  Visit Diagnosis: Stiffness of right hand, not elsewhere classified  Muscle weakness (generalized)  Pain in right arm    Problem List Patient Active Problem List   Diagnosis Date Noted  . CAP (community acquired pneumonia) 05/23/2015    Rosalyn Gess OTR/L,CLT 11/03/2015, 1:40 PM  [facility] [address], [place], [address] Phone: [phone]   Fax:  [phone]  Name: ALENA BLANKENBECKLER MRN: [id number] Date of Birth: Nov 30, 1965

## 2015-11-03 NOTE — Patient Instructions (Signed)
Pt to cont with wrist flexors stretch - elbow to side - if pain free can do extended arm  Cont with splint until pain free and then gradually discharge Pt to cont to modify activities - to not do aggravated  activities or movements

## 2015-11-29 ENCOUNTER — Ambulatory Visit: Payer: BC Managed Care – PPO | Admitting: Occupational Therapy

## 2016-01-16 ENCOUNTER — Other Ambulatory Visit: Payer: Self-pay | Admitting: Orthopedic Surgery

## 2016-01-16 DIAGNOSIS — M25561 Pain in right knee: Principal | ICD-10-CM

## 2016-01-16 DIAGNOSIS — G8929 Other chronic pain: Secondary | ICD-10-CM

## 2016-01-27 ENCOUNTER — Ambulatory Visit: Payer: BC Managed Care – PPO

## 2016-04-02 ENCOUNTER — Other Ambulatory Visit: Payer: Self-pay | Admitting: Internal Medicine

## 2016-04-02 DIAGNOSIS — Z1231 Encounter for screening mammogram for malignant neoplasm of breast: Secondary | ICD-10-CM

## 2016-05-04 ENCOUNTER — Ambulatory Visit
Admission: RE | Admit: 2016-05-04 | Discharge: 2016-05-04 | Disposition: A | Discharge status: Home / Self Care | Payer: BC Managed Care – PPO | Source: Ambulatory Visit | Attending: Physician Assistant | Admitting: Physician Assistant

## 2016-05-04 ENCOUNTER — Other Ambulatory Visit: Payer: Self-pay | Admitting: Physician Assistant

## 2016-05-04 DIAGNOSIS — Z90710 Acquired absence of both cervix and uterus: Secondary | ICD-10-CM | POA: Insufficient documentation

## 2016-05-04 DIAGNOSIS — R1031 Right lower quadrant pain: Secondary | ICD-10-CM

## 2016-05-04 DIAGNOSIS — R918 Other nonspecific abnormal finding of lung field: Secondary | ICD-10-CM | POA: Diagnosis not present

## 2016-05-04 DIAGNOSIS — K5730 Diverticulosis of large intestine without perforation or abscess without bleeding: Secondary | ICD-10-CM | POA: Diagnosis not present

## 2016-05-04 LAB — POCT I-STAT CREATININE: Creatinine, Ser: 0.9 mg/dL (ref 0.44–1.00)

## 2016-05-04 MED ORDER — IOPAMIDOL (ISOVUE-300) INJECTION 61%
100.0000 mL | Freq: Once | INTRAVENOUS | Status: AC | PRN
  Administered 2016-05-04: 100 mL via INTRAVENOUS

## 2016-05-10 ENCOUNTER — Other Ambulatory Visit: Payer: Self-pay | Admitting: Internal Medicine

## 2016-05-10 ENCOUNTER — Ambulatory Visit
Admission: RE | Admit: 2016-05-10 | Discharge: 2016-05-10 | Disposition: A | Discharge status: Home / Self Care | Payer: BC Managed Care – PPO | Source: Ambulatory Visit | Attending: Internal Medicine | Admitting: Internal Medicine

## 2016-05-10 DIAGNOSIS — Z1231 Encounter for screening mammogram for malignant neoplasm of breast: Secondary | ICD-10-CM | POA: Insufficient documentation

## 2017-04-01 ENCOUNTER — Other Ambulatory Visit: Payer: Self-pay | Admitting: Internal Medicine

## 2017-04-01 DIAGNOSIS — Z1231 Encounter for screening mammogram for malignant neoplasm of breast: Secondary | ICD-10-CM

## 2017-06-04 ENCOUNTER — Other Ambulatory Visit: Payer: Self-pay | Admitting: Orthopedic Surgery

## 2017-06-04 DIAGNOSIS — R29898 Other symptoms and signs involving the musculoskeletal system: Secondary | ICD-10-CM

## 2017-06-04 DIAGNOSIS — M75101 Unspecified rotator cuff tear or rupture of right shoulder, not specified as traumatic: Secondary | ICD-10-CM

## 2017-06-13 ENCOUNTER — Ambulatory Visit
Admission: RE | Admit: 2017-06-13 | Discharge: 2017-06-13 | Disposition: A | Discharge status: Home / Self Care | Payer: BC Managed Care – PPO | Source: Ambulatory Visit | Attending: Orthopedic Surgery | Admitting: Orthopedic Surgery

## 2017-06-13 DIAGNOSIS — M75101 Unspecified rotator cuff tear or rupture of right shoulder, not specified as traumatic: Secondary | ICD-10-CM | POA: Diagnosis not present

## 2017-06-13 DIAGNOSIS — M75111 Incomplete rotator cuff tear or rupture of right shoulder, not specified as traumatic: Secondary | ICD-10-CM | POA: Diagnosis not present

## 2017-06-13 DIAGNOSIS — M19011 Primary osteoarthritis, right shoulder: Secondary | ICD-10-CM | POA: Insufficient documentation

## 2017-06-13 DIAGNOSIS — R29898 Other symptoms and signs involving the musculoskeletal system: Secondary | ICD-10-CM

## 2017-07-03 ENCOUNTER — Other Ambulatory Visit: Payer: Self-pay

## 2017-07-03 ENCOUNTER — Encounter
Admission: RE | Admit: 2017-07-03 | Discharge: 2017-07-03 | Disposition: A | Discharge status: Home / Self Care | Payer: BC Managed Care – PPO | Source: Ambulatory Visit | Attending: Surgery | Admitting: Surgery

## 2017-07-03 HISTORY — DX: Unspecified osteoarthritis, unspecified site: M19.90

## 2017-07-03 NOTE — Patient Instructions (Signed)
Your procedure is scheduled on: 07/09/17 Tues Report to Same Day Surgery 2nd floor medical mall [facility] Entrance-take elevator on left to 2nd floor.  Check in with surgery information desk.) To find out your arrival time please call [phone] between 1PM - 3PM on 07/08/17 Mon  Remember: Instructions that are not followed completely may result in serious medical risk, up to and including death, or upon the discretion of your surgeon and anesthesiologist your surgery may need to be rescheduled.    _x___ 1. Do not eat food after midnight the night before your procedure. You may drink clear liquids up to 2 hours before you are scheduled to arrive at the hospital for your procedure.  Do not drink clear liquids within 2 hours of your scheduled arrival to the hospital.  Clear liquids include  --Water or Apple juice without pulp  --Clear carbohydrate beverage such as ClearFast or Gatorade  --Black Coffee or Clear Tea (No milk, no creamers, do not add anything to                  the coffee or Tea Type 1 and type 2 diabetics should only drink water.  No gum chewing or hard candies.     __x__ 2. No Alcohol for 24 hours before or after surgery.   __x__3. No Smoking or e-cigarettes for 24 prior to surgery.  Do not use any chewable tobacco products for at least 6 hour prior to surgery   ____  4. Bring all medications with you on the day of surgery if instructed.    __x__ 5. Notify your doctor if there is any change in your medical condition     (cold, fever, infections).    x___6. On the morning of surgery brush your teeth with toothpaste and water.  You may rinse your mouth with mouth wash if you wish.  Do not swallow any toothpaste or mouthwash.   Do not wear jewelry, make-up, hairpins, clips or nail polish.  Do not wear lotions, powders, or perfumes. You may wear deodorant.  Do not shave 48 hours prior to surgery. Men may shave face and neck.  Do not bring valuables to the hospital.     [facility] is not responsible for any belongings or valuables.               Contacts, dentures or bridgework may not be worn into surgery.  Leave your suitcase in the car. After surgery it may be brought to your room.  For patients admitted to the hospital, discharge time is determined by your                       treatment team.  _  Patients discharged the day of surgery will not be allowed to drive home.  You will need someone to drive you home and stay with you the night of your procedure.    Please read over the following fact sheets that you were given:   [facility] Preparing for Surgery and or MRSA Information   _x___ Take anti-hypertensive listed below, cardiac, seizure, asthma,     anti-reflux and psychiatric medicines. These include:  1.gabapentin (NEURONTIN) 400 MG capsule   2.  3.  4.  5.  6.  ____Fleets enema or Magnesium Citrate as directed.   _x___ Use CHG Soap or sage wipes as directed on instruction sheet   ____ Use inhalers on the day of surgery and bring to hospital  day of surgery  ____ Stop Metformin and Janumet 2 days prior to surgery.    ____ Take 1/2 of usual insulin dose the night before surgery and none on the morning     surgery.   _x___ Follow recommendations from Cardiologist, Pulmonologist or PCP regarding          stopping Aspirin, Coumadin, Plavix ,Eliquis, Effient, or Pradaxa, and Pletal.  X____Stop Anti-inflammatories such as Advil, Aleve, Ibuprofen, Motrin, Naproxen, Naprosyn, Goodies powders or aspirin products. OK to take Tylenol and                          Celebrex.   _x___ Stop supplements until after surgery.  But may continue Vitamin D, Vitamin B,       and multivitamin.   ____ Bring C-Pap to the hospital.

## 2017-07-09 ENCOUNTER — Ambulatory Visit: Payer: BC Managed Care – PPO

## 2017-07-09 ENCOUNTER — Ambulatory Visit
Admission: RE | Admit: 2017-07-09 | Discharge: 2017-07-09 | Disposition: A | Discharge status: Home / Self Care | Payer: BC Managed Care – PPO | Source: Ambulatory Visit | Attending: Surgery | Admitting: Surgery

## 2017-07-09 ENCOUNTER — Encounter
Admission: RE | Disposition: A | Discharge status: Home / Self Care | Payer: Self-pay | Source: Ambulatory Visit | Attending: Surgery

## 2017-07-09 ENCOUNTER — Other Ambulatory Visit: Payer: Self-pay

## 2017-07-09 DIAGNOSIS — M7581 Other shoulder lesions, right shoulder: Secondary | ICD-10-CM | POA: Diagnosis not present

## 2017-07-09 DIAGNOSIS — F419 Anxiety disorder, unspecified: Secondary | ICD-10-CM | POA: Insufficient documentation

## 2017-07-09 DIAGNOSIS — G2581 Restless legs syndrome: Secondary | ICD-10-CM | POA: Diagnosis not present

## 2017-07-09 DIAGNOSIS — M19011 Primary osteoarthritis, right shoulder: Secondary | ICD-10-CM | POA: Insufficient documentation

## 2017-07-09 DIAGNOSIS — Y92009 Unspecified place in unspecified non-institutional (private) residence as the place of occurrence of the external cause: Secondary | ICD-10-CM | POA: Diagnosis not present

## 2017-07-09 DIAGNOSIS — J310 Chronic rhinitis: Secondary | ICD-10-CM | POA: Diagnosis not present

## 2017-07-09 DIAGNOSIS — M7541 Impingement syndrome of right shoulder: Secondary | ICD-10-CM | POA: Insufficient documentation

## 2017-07-09 DIAGNOSIS — W109XXA Fall (on) (from) unspecified stairs and steps, initial encounter: Secondary | ICD-10-CM | POA: Diagnosis not present

## 2017-07-09 DIAGNOSIS — S46011A Strain of muscle(s) and tendon(s) of the rotator cuff of right shoulder, initial encounter: Secondary | ICD-10-CM | POA: Diagnosis not present

## 2017-07-09 DIAGNOSIS — Z79899 Other long term (current) drug therapy: Secondary | ICD-10-CM | POA: Insufficient documentation

## 2017-07-09 HISTORY — PX: SHOULDER ARTHROSCOPY WITH OPEN ROTATOR CUFF REPAIR: SHX6092

## 2017-07-09 HISTORY — DX: Failed or difficult intubation, initial encounter: T88.4XXA

## 2017-07-09 SURGERY — ARTHROSCOPY, SHOULDER WITH REPAIR, ROTATOR CUFF, OPEN
Anesthesia: General | Site: Shoulder | Laterality: Right

## 2017-07-09 MED ORDER — CEFAZOLIN SODIUM-DEXTROSE 2-4 GM/100ML-% IV SOLN
2.0000 g | Freq: Once | INTRAVENOUS | Status: AC
Start: 2017-07-09 — End: 2017-07-09
  Administered 2017-07-09: 2 g via INTRAVENOUS

## 2017-07-09 MED ORDER — BUPIVACAINE HCL (PF) 0.5 % IJ SOLN
INTRAMUSCULAR | Status: AC
  Filled 2017-07-09: qty 10

## 2017-07-09 MED ORDER — OXYCODONE HCL 5 MG PO TABS: 5.0000 mg | ORAL_TABLET | ORAL | 0 refills | Status: DC | PRN

## 2017-07-09 MED ORDER — PROPOFOL 10 MG/ML IV BOLUS
INTRAVENOUS | Status: AC
  Filled 2017-07-09: qty 20

## 2017-07-09 MED ORDER — OXYCODONE HCL 5 MG/5ML PO SOLN: 5.0000 mg | Freq: Once | ORAL | Status: DC | PRN

## 2017-07-09 MED ORDER — LIDOCAINE HCL (PF) 1 % IJ SOLN
INTRAMUSCULAR | Status: AC
  Filled 2017-07-09: qty 5

## 2017-07-09 MED ORDER — ONDANSETRON HCL 4 MG/2ML IJ SOLN
INTRAMUSCULAR | Status: DC | PRN
  Administered 2017-07-09: 4 mg via INTRAVENOUS

## 2017-07-09 MED ORDER — FENTANYL CITRATE (PF) 100 MCG/2ML IJ SOLN
50.0000 ug | Freq: Once | INTRAMUSCULAR | Status: AC
  Administered 2017-07-09: 50 ug via INTRAVENOUS

## 2017-07-09 MED ORDER — DEXAMETHASONE SODIUM PHOSPHATE 10 MG/ML IJ SOLN
INTRAMUSCULAR | Status: AC
  Filled 2017-07-09: qty 1

## 2017-07-09 MED ORDER — KETOROLAC TROMETHAMINE 30 MG/ML IJ SOLN
INTRAMUSCULAR | Status: AC
  Filled 2017-07-09: qty 1

## 2017-07-09 MED ORDER — MIDAZOLAM HCL 2 MG/2ML IJ SOLN
1.0000 mg | Freq: Once | INTRAMUSCULAR | Status: AC
  Administered 2017-07-09: 1 mg via INTRAVENOUS

## 2017-07-09 MED ORDER — LIDOCAINE HCL (CARDIAC) PF 100 MG/5ML IV SOSY
PREFILLED_SYRINGE | INTRAVENOUS | Status: DC | PRN
  Administered 2017-07-09: 30 mg via INTRAVENOUS

## 2017-07-09 MED ORDER — SUGAMMADEX SODIUM 200 MG/2ML IV SOLN
INTRAVENOUS | Status: DC | PRN
  Administered 2017-07-09: 146 mg via INTRAVENOUS

## 2017-07-09 MED ORDER — PROPOFOL 10 MG/ML IV BOLUS
INTRAVENOUS | Status: DC | PRN
  Administered 2017-07-09: 120 mg via INTRAVENOUS

## 2017-07-09 MED ORDER — OXYCODONE HCL 5 MG PO TABS: 5.0000 mg | ORAL_TABLET | Freq: Once | ORAL | Status: DC | PRN

## 2017-07-09 MED ORDER — POTASSIUM CHLORIDE IN NACL 20-0.9 MEQ/L-% IV SOLN: INTRAVENOUS | Status: DC

## 2017-07-09 MED ORDER — LACTATED RINGERS IV SOLN
INTRAVENOUS | Status: DC | PRN
  Administered 2017-07-09: 2 mL

## 2017-07-09 MED ORDER — ROCURONIUM BROMIDE 50 MG/5ML IV SOLN
INTRAVENOUS | Status: AC
  Filled 2017-07-09: qty 1

## 2017-07-09 MED ORDER — BUPIVACAINE-EPINEPHRINE (PF) 0.5% -1:200000 IJ SOLN
INTRAMUSCULAR | Status: AC
  Filled 2017-07-09: qty 30

## 2017-07-09 MED ORDER — CEFAZOLIN SODIUM-DEXTROSE 2-4 GM/100ML-% IV SOLN
INTRAVENOUS | Status: AC
  Filled 2017-07-09: qty 100

## 2017-07-09 MED ORDER — METOCLOPRAMIDE HCL 5 MG/ML IJ SOLN: 5.0000 mg | Freq: Three times a day (TID) | INTRAMUSCULAR | Status: DC | PRN

## 2017-07-09 MED ORDER — KETOROLAC TROMETHAMINE 30 MG/ML IJ SOLN
INTRAMUSCULAR | Status: DC | PRN
  Administered 2017-07-09: 30 mg via INTRAVENOUS

## 2017-07-09 MED ORDER — SUGAMMADEX SODIUM 200 MG/2ML IV SOLN
INTRAVENOUS | Status: AC
  Filled 2017-07-09: qty 2

## 2017-07-09 MED ORDER — BUPIVACAINE HCL (PF) 0.5 % IJ SOLN
INTRAMUSCULAR | Status: DC | PRN
  Administered 2017-07-09: 3 mL via PERINEURAL
  Administered 2017-07-09: 7 mL via PERINEURAL

## 2017-07-09 MED ORDER — EPINEPHRINE PF 1 MG/ML IJ SOLN
INTRAMUSCULAR | Status: AC
  Filled 2017-07-09: qty 2

## 2017-07-09 MED ORDER — ROCURONIUM BROMIDE 100 MG/10ML IV SOLN
INTRAVENOUS | Status: DC | PRN
  Administered 2017-07-09: 40 mg via INTRAVENOUS

## 2017-07-09 MED ORDER — ONDANSETRON HCL 4 MG/2ML IJ SOLN
INTRAMUSCULAR | Status: AC
  Filled 2017-07-09: qty 2

## 2017-07-09 MED ORDER — FENTANYL CITRATE (PF) 100 MCG/2ML IJ SOLN
INTRAMUSCULAR | Status: AC
  Administered 2017-07-09: 50 ug via INTRAVENOUS
  Filled 2017-07-09: qty 2

## 2017-07-09 MED ORDER — OXYCODONE HCL 5 MG PO TABS: 5.0000 mg | ORAL_TABLET | ORAL | Status: DC | PRN

## 2017-07-09 MED ORDER — METOCLOPRAMIDE HCL 10 MG PO TABS: 5.0000 mg | ORAL_TABLET | Freq: Three times a day (TID) | ORAL | Status: DC | PRN

## 2017-07-09 MED ORDER — FENTANYL CITRATE (PF) 100 MCG/2ML IJ SOLN: 25.0000 ug | INTRAMUSCULAR | Status: DC | PRN

## 2017-07-09 MED ORDER — FAMOTIDINE 20 MG PO TABS
20.0000 mg | ORAL_TABLET | Freq: Once | ORAL | Status: AC
  Administered 2017-07-09: 20 mg via ORAL

## 2017-07-09 MED ORDER — DEXAMETHASONE SODIUM PHOSPHATE 10 MG/ML IJ SOLN
INTRAMUSCULAR | Status: DC | PRN
  Administered 2017-07-09: 10 mg via INTRAVENOUS

## 2017-07-09 MED ORDER — FENTANYL CITRATE (PF) 100 MCG/2ML IJ SOLN
INTRAMUSCULAR | Status: DC | PRN
  Administered 2017-07-09: 100 ug via INTRAVENOUS

## 2017-07-09 MED ORDER — BUPIVACAINE LIPOSOME 1.3 % IJ SUSP
INTRAMUSCULAR | Status: DC | PRN
  Administered 2017-07-09: 7 mL via PERINEURAL
  Administered 2017-07-09: 13 mL via PERINEURAL

## 2017-07-09 MED ORDER — BUPIVACAINE-EPINEPHRINE 0.5% -1:200000 IJ SOLN
INTRAMUSCULAR | Status: DC | PRN
  Administered 2017-07-09: 30 mL

## 2017-07-09 MED ORDER — ONDANSETRON HCL 4 MG PO TABS: 4.0000 mg | ORAL_TABLET | Freq: Four times a day (QID) | ORAL | Status: DC | PRN

## 2017-07-09 MED ORDER — BUPIVACAINE LIPOSOME 1.3 % IJ SUSP
INTRAMUSCULAR | Status: AC
  Filled 2017-07-09: qty 20

## 2017-07-09 MED ORDER — FAMOTIDINE 20 MG PO TABS
ORAL_TABLET | ORAL | Status: AC
  Administered 2017-07-09: 20 mg via ORAL
  Filled 2017-07-09: qty 1

## 2017-07-09 MED ORDER — PHENYLEPHRINE HCL 10 MG/ML IJ SOLN
INTRAMUSCULAR | Status: DC | PRN
  Administered 2017-07-09 (×2): 50 ug via INTRAVENOUS
  Administered 2017-07-09 (×2): 100 ug via INTRAVENOUS
  Administered 2017-07-09 (×2): 200 ug via INTRAVENOUS

## 2017-07-09 MED ORDER — FENTANYL CITRATE (PF) 100 MCG/2ML IJ SOLN
INTRAMUSCULAR | Status: AC
  Filled 2017-07-09: qty 2

## 2017-07-09 MED ORDER — LACTATED RINGERS IV SOLN
INTRAVENOUS | Status: DC
  Administered 2017-07-09 (×2): via INTRAVENOUS

## 2017-07-09 MED ORDER — ONDANSETRON HCL 4 MG/2ML IJ SOLN: 4.0000 mg | Freq: Four times a day (QID) | INTRAMUSCULAR | Status: DC | PRN

## 2017-07-09 MED ORDER — MIDAZOLAM HCL 2 MG/2ML IJ SOLN
INTRAMUSCULAR | Status: AC
  Administered 2017-07-09: 1 mg via INTRAVENOUS
  Filled 2017-07-09: qty 2

## 2017-07-09 SURGICAL SUPPLY — 43 items
ANCHOR JUGGERKNOT WTAP NDL 2.9 (Anchor) ×1 IMPLANT
ANCHOR TENDON REGENETEN (Staple) ×1 IMPLANT
ANCHORS BONE REGENETEN (Anchor) ×1 IMPLANT
BIT DRILL JUGRKNT W/NDL BIT2.9 (DRILL) IMPLANT
BLADE FULL RADIUS 3.5 (BLADE) ×2 IMPLANT
BUR ACROMIONIZER 4.0 (BURR) ×2 IMPLANT
CANNULA SHAVER 8MMX76MM (CANNULA) ×1 IMPLANT
CHLORAPREP W/TINT 26ML (MISCELLANEOUS) ×2 IMPLANT
COVER MAYO STAND STRL (DRAPES) ×2 IMPLANT
DRAPE IMP U-DRAPE 54X76 (DRAPES) ×4 IMPLANT
DRILL JUGGERKNOT W/NDL BIT 2.9 (DRILL) ×2
ELECT REM PT RETURN 9FT ADLT (ELECTROSURGICAL) ×2
ELECTRODE REM PT RTRN 9FT ADLT (ELECTROSURGICAL) ×1 IMPLANT
GAUZE PETRO XEROFOAM 1X8 (MISCELLANEOUS) ×2 IMPLANT
GAUZE SPONGE 4X4 12PLY STRL (GAUZE/BANDAGES/DRESSINGS) ×2 IMPLANT
GLOVE BIO SURGEON STRL SZ7.5 (GLOVE) ×4 IMPLANT
GLOVE BIO SURGEON STRL SZ8 (GLOVE) ×4 IMPLANT
GLOVE BIOGEL PI IND STRL 8 (GLOVE) ×1 IMPLANT
GLOVE BIOGEL PI INDICATOR 8 (GLOVE) ×1
GLOVE INDICATOR 8.0 STRL GRN (GLOVE) ×2 IMPLANT
GOWN STRL REUS W/ TWL LRG LVL3 (GOWN DISPOSABLE) ×1 IMPLANT
GOWN STRL REUS W/ TWL XL LVL3 (GOWN DISPOSABLE) ×1 IMPLANT
GOWN STRL REUS W/TWL LRG LVL3 (GOWN DISPOSABLE) ×1
GOWN STRL REUS W/TWL XL LVL3 (GOWN DISPOSABLE) ×1
GRASPER SUT 15 45D LOW PRO (SUTURE) IMPLANT
IMPLANT REGENETEN MEDIUM (Shoulder) ×1 IMPLANT
IV LACTATED RINGER IRRG 3000ML (IV SOLUTION) ×2
IV LR IRRIG 3000ML ARTHROMATIC (IV SOLUTION) ×2 IMPLANT
MANIFOLD NEPTUNE II (INSTRUMENTS) ×2 IMPLANT
MASK FACE SPIDER DISP (MASK) ×2 IMPLANT
MAT BLUE FLOOR 46X72 FLO (MISCELLANEOUS) ×2 IMPLANT
PACK ARTHROSCOPY SHOULDER (MISCELLANEOUS) ×2 IMPLANT
SLING ARM LRG DEEP (SOFTGOODS) ×1 IMPLANT
SLING ULTRA II LG (MISCELLANEOUS) ×2 IMPLANT
STAPLER SKIN PROX 35W (STAPLE) ×2 IMPLANT
STRAP SAFETY 5IN WIDE (MISCELLANEOUS) ×2 IMPLANT
SUT ETHIBOND 0 MO6 C/R (SUTURE) ×2 IMPLANT
SUT VIC AB 2-0 CT1 27 (SUTURE) ×2
SUT VIC AB 2-0 CT1 TAPERPNT 27 (SUTURE) ×2 IMPLANT
TAPE MICROFOAM 4IN (TAPE) ×2 IMPLANT
TUBING ARTHRO INFLOW-ONLY STRL (TUBING) ×2 IMPLANT
TUBING CONNECTING 10 (TUBING) ×2 IMPLANT
WAND HAND CNTRL MULTIVAC 90 (MISCELLANEOUS) ×2 IMPLANT

## 2017-07-09 NOTE — Transfer of Care (Signed)
Immediate Anesthesia Transfer of Care Note  Patient: Martha Whitney  Procedure(s) Performed: SHOULDER ARTHROSCOPY WITH OPEN ROTATOR CUFF REPAIR (Right Shoulder)  Patient Location: PACU  Anesthesia Type:General  Level of Consciousness: sedated  Airway & Oxygen Therapy: Patient connected to face mask oxygen  Post-op Assessment: Post -op Vital signs reviewed and stable  Post vital signs: stable  Last Vitals:  Vitals Value Taken Time  BP 130/68 07/09/2017  3:05 PM  Temp 36 C 07/09/2017  3:05 PM  Pulse 88 07/09/2017  3:06 PM  Resp 16 07/09/2017  3:06 PM  SpO2 100 % 07/09/2017  3:06 PM  Vitals shown include unvalidated device data.  Last Pain:  Vitals:   07/09/17 1505  TempSrc: Temporal  PainSc:          Complications: No apparent anesthesia complications

## 2017-07-09 NOTE — Anesthesia Preprocedure Evaluation (Signed)
Anesthesia Evaluation  Patient identified by MRN, date of birth, ID band Patient awake    Reviewed: Allergy & Precautions, H&P , NPO status , Patient's Chart, lab work & pertinent test results  History of Anesthesia Complications Negative for: history of anesthetic complications  Airway Mallampati: III  TM Distance: <3 FB Neck ROM: full    Dental  (+) Chipped, Poor Dentition   Pulmonary neg shortness of breath, pneumonia,           Cardiovascular Exercise Tolerance: Good (-) angina(-) Past MI and (-) DOE negative cardio ROS       Neuro/Psych negative neurological ROS  negative psych ROS   GI/Hepatic negative GI ROS, Neg liver ROS,   Endo/Other  negative endocrine ROS  Renal/GU Renal disease     Musculoskeletal  (+) Arthritis ,   Abdominal   Peds  Hematology negative hematology ROS (+)   Anesthesia Other Findings Past Medical History: No date: Arthritis No date: Chronic kidney disease     Comment:  kidney stones No date: Endometriosis determined by laparoscopy 2007: Restless leg syndrome  Past Surgical History: No date: ABDOMINAL HYSTERECTOMY 2003: CARPAL TUNNEL RELEASE; Left No date: CHOLECYSTECTOMY 1969: CYSTECTOMY; N/A     Comment:  cyst removed from throat 10/21/2014: GANGLION CYST EXCISION; Left     Comment:  Procedure: REMOVAL GANGLION OF WRIST;  Surgeon: Hessie Knows, MD;  Location: [facility] ORS;  Service: Orthopedics;                Laterality: Left; 06/22/2014: LAPAROSCOPIC SALPINGO OOPHERECTOMY; Bilateral     Comment:  Procedure: LAPAROSCOPIC SALPINGO OOPHORECTOMY;  Surgeon:              Aletha Halim, MD;  Location: [facility] ORS;  Service:               Gynecology;  Laterality: Bilateral; No date: LAPAROSCOPIC TOTAL HYSTERECTOMY No date: LAPAROSCOPY WITH TUBAL LIGATION 1995: LEEP; N/A No date: TONSILLECTOMY  BMI    Body Mass Index:  28.52 kg/m       Reproductive/Obstetrics negative OB ROS                             Anesthesia Physical Anesthesia Plan  ASA: III  Anesthesia Plan: General ETT   Post-op Pain Management: GA combined w/ Regional for post-op pain   Induction: Intravenous  PONV Risk Score and Plan: Ondansetron, Dexamethasone, Midazolam and Treatment may vary due to age or medical condition  Airway Management Planned: Oral ETT  Additional Equipment:   Intra-op Plan:   Post-operative Plan: Extubation in OR  Informed Consent: I have reviewed the patients History and Physical, chart, labs and discussed the procedure including the risks, benefits and alternatives for the proposed anesthesia with the patient or authorized representative who has indicated his/her understanding and acceptance.   Dental Advisory Given  Plan Discussed with: Anesthesiologist, CRNA and Surgeon  Anesthesia Plan Comments: (Patient consented for risks of anesthesia including but not limited to:  - adverse reactions to medications - damage to teeth, lips or other oral mucosa - sore throat or hoarseness - Damage to heart, brain, lungs or loss of life  Patient voiced understanding.)        Anesthesia Quick Evaluation

## 2017-07-09 NOTE — H&P (Signed)
Paper H&P to be scanned into permanent record. H&P reviewed and patient re-examined. No changes. 

## 2017-07-09 NOTE — Progress Notes (Signed)
Patient eating on ice chips.

## 2017-07-09 NOTE — Progress Notes (Signed)
Patient arouses to voice, able to move her fingers On her right hand, fingers warm to touch and pink in color.  Sling on patient from OR.  Denies any pain.

## 2017-07-09 NOTE — Anesthesia Post-op Follow-up Note (Signed)
Anesthesia QCDR form completed.        

## 2017-07-09 NOTE — Anesthesia Procedure Notes (Signed)
Procedure Name: Intubation Date/Time: 07/09/2017 1:27 PM Performed by: Aline Brochure, CRNA Pre-anesthesia Checklist: Patient identified, Emergency Drugs available, Suction available and Patient being monitored Patient Re-evaluated:Patient Re-evaluated prior to induction Oxygen Delivery Method: Circle system utilized Preoxygenation: Pre-oxygenation with 100% oxygen Induction Type: IV induction Ventilation: Mask ventilation without difficulty and Oral airway inserted - appropriate to patient size Laryngoscope Size: McGraph and 3 Grade View: Grade I Tube type: Oral Tube size: 7.0 mm Number of attempts: 2 Airway Equipment and Method: Bougie stylet and Video-laryngoscopy Placement Confirmation: ETT inserted through vocal cords under direct vision,  positive ETCO2 and breath sounds checked- equal and bilateral Secured at: 21 cm Tube secured with: Tape Dental Injury: Bloody posterior oropharynx  Difficulty Due To: Difficulty was anticipated and Difficult Airway- due to anterior larynx Future Recommendations: Recommend- induction with short-acting agent, and alternative techniques readily available

## 2017-07-09 NOTE — Anesthesia Procedure Notes (Signed)
Anesthesia Regional Block: Interscalene brachial plexus block   Pre-Anesthetic Checklist: ,, timeout performed, Correct Patient, Correct Site, Correct Laterality, Correct Procedure, Correct Position, site marked, Risks and benefits discussed,  Surgical consent,  Pre-op evaluation,  At surgeon's request and post-op pain management  Laterality: Upper and Right  Prep: chloraprep       Needles:  Injection technique: Single-shot  Needle Type: Stimiplex     Needle Length: 5cm  Needle Gauge: 22     Additional Needles:   Procedures:,,,, ultrasound used (permanent image in chart),,,,  Narrative:  Start time: 07/09/2017 12:17 PM End time: 07/09/2017 12:24 PM Injection made incrementally with aspirations every 5 mL.  Performed by: Personally  Anesthesiologist: Piscitello, Precious Haws, MD  Additional Notes: Functioning IV was confirmed and monitors were applied.  A 3mm 22ga Stimuplex needle was used. Sterile prep,hand hygiene and sterile gloves were used.  Minimal sedation used for procedure.  No paresthesia endorsed by patient during the procedure.  Negative aspiration and negative test dose prior to incremental administration of local anesthetic. The patient tolerated the procedure well with no immediate complications.

## 2017-07-09 NOTE — Op Note (Signed)
07/09/2017  3:16 PM  Patient:   Martha Whitney  Pre-Op Diagnosis:   Impingement/tendinopathy with bursal surface partial-thickness rotator cuff tear, right shoulder.  Post-Op Diagnosis:   Impingement/tendinopathy with rotator cuff tear, degenerative joint disease, degenerative labral fraying, and biceps tendinopathy, right shoulder.  Procedure:   Extensive arthroscopic debridement, arthroscopic subacromial decompression, mini-open rotator cuff repair using the [employer]hew Regeneten patch, and mini-open biceps tenodesis, right shoulder.  Anesthesia:   General endotracheal with interscalene block placed preoperatively by the anesthesiologist.  Surgeon:   Pascal Lux, MD  Assistant:   None  Findings:   As above. There were focal grade 3-4 chondromalacial changes involving the central articular portion of the humeral head and grade 2 chondromalacial changes involving the mid anterior portion of the glenoid. There was significant fraying of the labrum involving the anterior, superior, and posterior portions without frank detachment from the glenoid. The biceps also demonstrated moderate tendinopathy with a partial-thickness tear at the labral junction. The rotator cuff demonstrated a bursal surface tear involving the outer 20% of the mid-insertional fibers of the supraspinatus tendon.  Complications:   None  Fluids:   1000 cc  Estimated blood loss:   10 cc  Tourniquet time:   None  Drains:   None  Closure:   Staples      Brief clinical note:   The patient is a 52 year old female with a history of right shoulder pain. The patient's symptoms have progressed despite medications, activity modification, etc. The patient's history and examination are consistent with impingement/tendinopathy with a rotator cuff tear.  A preoperative MRI scan confirmed the presence of a bursal surface partial-thickness rotator cuff tear. The patient presents at this time for definitive management of  these shoulder symptoms.  Procedure:   The patient underwent placement of an interscalene block by the anesthesiologist in the preoperative holding area before being brought into the operating room and lain in the supine position. The patient then underwent general endotracheal intubation and anesthesia before being repositioned in the beach chair position using the beach chair positioner. The right shoulder and upper extremity were prepped with ChloraPrep solution before being draped sterilely. Preoperative antibiotics were administered. A timeout was performed to confirm the proper surgical site before the expected portal sites and incision site were injected with 0.5% Sensorcaine with epinephrine. A posterior portal was created and the glenohumeral joint thoroughly inspected with the findings as described above. An anterior portal was created using an outside-in technique. The labrum and rotator cuff were further probed, again confirming the above-noted findings. The areas of labral fraying were debrided back to stable margins using the full-radius resector, as were areas of synovitis anteriorly and superiorly. An abrasion chondroplasty also was performed to remove loose articular cartilage from the area of chondromalacia on the humeral head. The ArthroCare wand was inserted and used to release the biceps from its labral anchor. It also was used to obtain hemostasis as well as to "anneal" the labrum superiorly, posteriorly, and anteriorly. The instruments were removed from the joint after suctioning the excess fluid.  The camera was repositioned through the posterior portal into the subacromial space. A separate lateral portal was created using an outside-in technique. The 3.5 mm full-radius resector was introduced and used to perform a subtotal bursectomy. The ArthroCare wand was then inserted and used to remove the periosteal tissue off the undersurface of the anterior third of the acromion as well as to  recess the coracoacromial ligament from its attachment  along the anterior and lateral margins of the acromion. The 4.0 mm acromionizing bur was introduced and used to complete the decompression by removing the undersurface of the anterior third of the acromion. The full radius resector was reintroduced to remove any residual bony debris before the ArthroCare wand was reintroduced to obtain hemostasis. The instruments were then removed from the subacromial space after suctioning the excess fluid.  An approximately 4-5 cm incision was made over the anterolateral aspect of the shoulder beginning at the anterolateral corner of the acromion and extending distally in line with the bicipital groove. This incision was carried down through the subcutaneous tissues to expose the deltoid fascia. The raphae between the anterior and middle thirds was identified and this plane developed to provide access into the subacromial space. Additional bursal tissues were debrided sharply using Metzenbaum scissors. The bursal surface partial-thickness tear of the supraspinatus tendon was readily identified. Based on its configuration, it was felt not thick enough to require completion and formal repair. Therefore, a [place] patch was applied over the bursal surface tear to try to stimulate healing of the tendon. An apparent watertight closure was obtained.  The bicipital groove was identified by palpation and opened for 1-1.5 cm. The biceps tendon stump was retrieved through this defect. The floor of the bicipital groove was roughened with a curet before a single Biomet 2.9 mm JuggerKnot anchor was inserted. Both sets of sutures were passed through the biceps tendon and tied securely to effect the tenodesis. The bicipital sheath was reapproximated using two #0 Ethibond interrupted sutures, incorporating the biceps tendon to further reinforce the tenodesis.  The wound was copiously irrigated with sterile saline  solution before the deltoid raphae was reapproximated using 2-0 Vicryl interrupted sutures. The subcutaneous tissues were closed in two layers using 2-0 Vicryl interrupted sutures before the skin was closed using staples. The portal sites also were closed using staples. A sterile bulky dressing was applied to the shoulder before the arm was placed into a shoulder immobilizer. The patient was then awakened, extubated, and returned to the recovery room in satisfactory condition after tolerating the procedure well.

## 2017-07-09 NOTE — Discharge Instructions (Addendum)
Interscalene Nerve Block with Exparel  1.  For your surgery you have received an Interscalene Nerve Block with Exparel. 2. Nerve Blocks affect many types of nerves, including nerves that control movement, pain and normal sensation.  You may experience feelings such as numbness, tingling, heaviness, weakness or the inability to move your arm or the feeling or sensation that your arm has "fallen asleep". 3. A nerve block with Exparel can last up to 5 days.  Usually the weakness wears off first.  The tingling and heaviness usually wear off next.  Finally you may start to notice pain.  Keep in mind that this may occur in any order.  Once a nerve block starts to wear off it is usually completely gone within 60 minutes. 4. ISNB may cause mild shortness of breath, a hoarse voice, blurry vision, unequal pupils, or drooping of the face on the same side as the nerve block.  These symptoms will usually resolve with the numbness.  Very rarely the procedure itself can cause mild seizures. 5. If needed, your surgeon will give you a prescription for pain medication.  It will take about 60 minutes for the oral pain medication to become fully effective.  So, it is recommended that you start taking this medication before the nerve block first begins to wear off, or when you first begin to feel discomfort. 6. Take your pain medication only as prescribed.  Pain medication can cause sedation and decrease your breathing if you take more than you need for the level of pain that you have. 7. Nausea is a common side effect of many pain medications.  You may want to eat something before taking your pain medicine to prevent nausea. 8. After an Interscalene nerve block, you cannot feel pain, pressure or extremes in temperature in the effected arm.  Because your arm is numb it is at an increased risk for injury.  To decrease the possibility of injury, please practice the following:  a. While you are awake change the position of  your arm frequently to prevent too much pressure on any one area for prolonged periods of time. b.  If you have a cast or tight dressing, check the color or your fingers every couple of hours.  Call your surgeon with the appearance of any discoloration (white or blue). c. If you are given a sling to wear before you go home, please wear it  at all times until the block has completely worn off.  Do not get up at night without your sling. d. Please contact [facility] or your surgeon if you do not begin to regain sensation after 7 days from the surgery.  Anesthesia may be contacted by calling the Same Day Surgery Department, Mon. through Fri., 6 am to 4 pm at [phone].   e. If you experience any other problems or concerns, please contact your surgeon's office. f. If you experience severe or prolonged shortness of breath go to the nearest emergency department.   Orthopedic discharge instructions: Keep dressing dry and intact.  May shower after dressing changed on post-op day #4 (Saturday).  Cover staples with Band-Aids after drying off. Apply ice frequently to shoulder. Take ibuprofen 400-600 mg TID with meals for 7-10 days, then as necessary. Take oxycodone as prescribed when needed.  May supplement with ES Tylenol if necessary. Keep shoulder immobilizer on at all times except may remove for bathing purposes. Follow-up in 10-14 days or as scheduled.  AMBULATORY SURGERY  DISCHARGE INSTRUCTIONS  1) The drugs that you were given will stay in your system until tomorrow so for the next 24 hours you should not:  A) Drive an automobile B) Make any legal decisions C) Drink any alcoholic beverage   2) You may resume regular meals tomorrow.  Today it is better to start with liquids and gradually work up to solid foods.  You may eat anything you prefer, but it is better to start with liquids, then soup and crackers, and gradually work up to solid foods.   3) Please notify your doctor  immediately if you have any unusual bleeding, trouble breathing, redness and pain at the surgery site, drainage, fever, or pain not relieved by medication.    4) Additional Instructions:        Please contact your physician with any problems or Same Day Surgery at [phone], Monday through Friday 6 am to 4 pm, or [place] at [facility] number at [phone].

## 2017-07-10 ENCOUNTER — Ambulatory Visit: Admit: 2017-07-10 | Payer: BC Managed Care – PPO | Admitting: Surgery

## 2017-07-10 ENCOUNTER — Encounter: Payer: Self-pay | Admitting: Surgery

## 2017-07-10 SURGERY — ARTHROSCOPY, SHOULDER WITH REPAIR, ROTATOR CUFF, OPEN
Anesthesia: Choice | Laterality: Right

## 2017-07-10 NOTE — Anesthesia Postprocedure Evaluation (Signed)
Anesthesia Post Note  Patient: Martha Whitney  Procedure(s) Performed: SHOULDER ARTHROSCOPY WITH OPEN ROTATOR CUFF REPAIR (Right Shoulder)  Patient location during evaluation: PACU Anesthesia Type: General Level of consciousness: awake and alert Pain management: pain level controlled Vital Signs Assessment: post-procedure vital signs reviewed and stable Respiratory status: spontaneous breathing, nonlabored ventilation, respiratory function stable and patient connected to nasal cannula oxygen Cardiovascular status: blood pressure returned to baseline and stable Postop Assessment: no apparent nausea or vomiting Anesthetic complications: no     Last Vitals:  Vitals:   07/09/17 1559 07/09/17 1617  BP: 123/70 109/64  Pulse: 89 74  Resp: 16 16  Temp: (!) 36.2 C   SpO2: 97% 97%    Last Pain:  Vitals:   07/09/17 1559  TempSrc:   PainSc: 0-No pain                 Alphonsus Sias

## 2017-10-27 ENCOUNTER — Inpatient Hospital Stay: Admit: 2017-10-27 | Payer: BC Managed Care – PPO | Admitting: Surgery

## 2017-10-27 SURGERY — APPENDECTOMY, LAPAROSCOPIC
Anesthesia: General

## 2017-10-29 ENCOUNTER — Other Ambulatory Visit: Payer: Self-pay | Admitting: Physician Assistant

## 2017-10-29 ENCOUNTER — Ambulatory Visit
Admission: RE | Admit: 2017-10-29 | Discharge: 2017-10-29 | Disposition: A | Discharge status: Home / Self Care | Payer: BC Managed Care – PPO | Source: Ambulatory Visit | Attending: Physician Assistant | Admitting: Physician Assistant

## 2017-10-29 DIAGNOSIS — R109 Unspecified abdominal pain: Secondary | ICD-10-CM

## 2017-10-29 DIAGNOSIS — K5730 Diverticulosis of large intestine without perforation or abscess without bleeding: Secondary | ICD-10-CM | POA: Diagnosis not present

## 2017-10-29 DIAGNOSIS — Z90710 Acquired absence of both cervix and uterus: Secondary | ICD-10-CM | POA: Diagnosis not present

## 2017-10-29 DIAGNOSIS — Z9049 Acquired absence of other specified parts of digestive tract: Secondary | ICD-10-CM | POA: Diagnosis not present

## 2017-10-29 DIAGNOSIS — I700 Atherosclerosis of aorta: Secondary | ICD-10-CM | POA: Diagnosis not present

## 2017-10-29 DIAGNOSIS — R918 Other nonspecific abnormal finding of lung field: Secondary | ICD-10-CM | POA: Insufficient documentation

## 2017-10-29 DIAGNOSIS — R110 Nausea: Secondary | ICD-10-CM | POA: Diagnosis present

## 2017-10-29 DIAGNOSIS — K760 Fatty (change of) liver, not elsewhere classified: Secondary | ICD-10-CM | POA: Insufficient documentation

## 2017-10-29 DIAGNOSIS — R319 Hematuria, unspecified: Secondary | ICD-10-CM | POA: Diagnosis present

## 2018-02-05 ENCOUNTER — Ambulatory Visit
Admission: RE | Admit: 2018-02-05 | Discharge: 2018-02-05 | Disposition: A | Discharge status: Home / Self Care | Payer: BC Managed Care – PPO | Source: Ambulatory Visit | Attending: Internal Medicine | Admitting: Internal Medicine

## 2018-02-05 DIAGNOSIS — Z1231 Encounter for screening mammogram for malignant neoplasm of breast: Secondary | ICD-10-CM | POA: Diagnosis present

## 2018-05-06 ENCOUNTER — Emergency Department
Admission: EM | Admit: 2018-05-06 | Discharge: 2018-05-06 | Disposition: A | Discharge status: Home / Self Care | Payer: BC Managed Care – PPO | Attending: Emergency Medicine | Admitting: Emergency Medicine

## 2018-05-06 ENCOUNTER — Encounter: Payer: Self-pay | Admitting: *Deleted

## 2018-05-06 ENCOUNTER — Other Ambulatory Visit: Payer: Self-pay

## 2018-05-06 ENCOUNTER — Emergency Department: Payer: BC Managed Care – PPO

## 2018-05-06 DIAGNOSIS — Y9389 Activity, other specified: Secondary | ICD-10-CM | POA: Diagnosis not present

## 2018-05-06 DIAGNOSIS — Y999 Unspecified external cause status: Secondary | ICD-10-CM | POA: Insufficient documentation

## 2018-05-06 DIAGNOSIS — W01190A Fall on same level from slipping, tripping and stumbling with subsequent striking against furniture, initial encounter: Secondary | ICD-10-CM | POA: Insufficient documentation

## 2018-05-06 DIAGNOSIS — Y929 Unspecified place or not applicable: Secondary | ICD-10-CM | POA: Diagnosis not present

## 2018-05-06 DIAGNOSIS — S20212A Contusion of left front wall of thorax, initial encounter: Secondary | ICD-10-CM | POA: Insufficient documentation

## 2018-05-06 DIAGNOSIS — S299XXA Unspecified injury of thorax, initial encounter: Secondary | ICD-10-CM | POA: Diagnosis present

## 2018-05-06 DIAGNOSIS — Z79899 Other long term (current) drug therapy: Secondary | ICD-10-CM | POA: Diagnosis not present

## 2018-05-06 DIAGNOSIS — M940 Chondrocostal junction syndrome [Tietze]: Secondary | ICD-10-CM

## 2018-05-06 MED ORDER — PREDNISONE 50 MG PO TABS: 50.0000 mg | ORAL_TABLET | Freq: Every day | ORAL | 0 refills | Status: DC

## 2018-05-06 MED ORDER — MELOXICAM 15 MG PO TABS: 15.0000 mg | ORAL_TABLET | Freq: Every day | ORAL | 0 refills | Status: DC

## 2018-05-06 MED ORDER — HYDROCODONE-ACETAMINOPHEN 5-325 MG PO TABS
1.0000 | ORAL_TABLET | Freq: Once | ORAL | Status: AC
  Administered 2018-05-06: 1 via ORAL
  Filled 2018-05-06: qty 1

## 2018-05-06 NOTE — ED Provider Notes (Signed)
[facility] Emergency Department Provider Note  ____________________________________________  Time seen: Approximately 9:17 PM  I have reviewed the triage vital signs and the nursing notes.   HISTORY  Chief Complaint Rib Injury    HPI Martha Whitney is a 53 y.o. female who presents emergency department complaining of left lateral rib pain.  Patient reports that approximately 2 weeks ago she was unplugging her phone from behind her dresser, had the hand on top of the dresser on top of some papers.  Patient reports that the paper slipped, causing her to fall and striking her left ribs against the corner of a dresser.  Patient initially had some pain to the area but was able to manage and deal with the pain.  She reports that she thought it would "go away with time."  Patient reports that she does have a history of chronic bronchitis with asthma and states that today she had some coughing, felt worsened rib pain to the left side.  No frank shortness of breath.  Pain feels sharp to the left lateral ribs.  No other injury or complaint.  Patient denies any URI symptoms of nasal congestion, sore throat, fevers or chills, productive cough.         Past Medical History:  Diagnosis Date  . Arthritis   . Chronic kidney disease    kidney stones  . Difficult intubation   . Endometriosis determined by laparoscopy   . Restless leg syndrome 2007    Patient Active Problem List   Diagnosis Date Noted  . CAP (community acquired pneumonia) 05/23/2015    Past Surgical History:  Procedure Laterality Date  . ABDOMINAL HYSTERECTOMY    . CARPAL TUNNEL RELEASE Left 2003  . CHOLECYSTECTOMY    . CYSTECTOMY N/A 1969   cyst removed from throat  . GANGLION CYST EXCISION Left 10/21/2014   Procedure: REMOVAL GANGLION OF WRIST;  Surgeon: Hessie Knows, MD;  Location: [facility] ORS;  Service: Orthopedics;  Laterality: Left;  . LAPAROSCOPIC SALPINGO OOPHERECTOMY Bilateral 06/22/2014    Procedure: LAPAROSCOPIC SALPINGO OOPHORECTOMY;  Surgeon: Aletha Halim, MD;  Location: [facility] ORS;  Service: Gynecology;  Laterality: Bilateral;  . LAPAROSCOPIC TOTAL HYSTERECTOMY    . LAPAROSCOPY WITH TUBAL LIGATION    . LEEP N/A 1995  . SHOULDER ARTHROSCOPY WITH OPEN ROTATOR CUFF REPAIR Right 07/09/2017   Procedure: SHOULDER ARTHROSCOPY WITH OPEN ROTATOR CUFF REPAIR;  Surgeon: Corky Mull, MD;  Location: [facility] ORS;  Service: Orthopedics;  Laterality: Right;  extensive debridement, decompression, miniopen biceps tenodesis, mini open rotator cuff repair  . TONSILLECTOMY      Prior to Admission medications   Medication Sig Start Date End Date Taking? Authorizing Provider  gabapentin (NEURONTIN) 400 MG capsule Take 400 mg by mouth daily as needed (for restless legs).     [provider]  meloxicam (MOBIC) 15 MG tablet Take 1 tablet (15 mg total) by mouth daily. 05/06/18   Floreen Teegarden, Charline Bills, PA-C  oxyCODONE (ROXICODONE) 5 MG immediate release tablet Take 1-2 tablets (5-10 mg total) by mouth every 4 (four) hours as needed. 07/09/17   Poggi, Marshall Cork, MD  phentermine 37.5 MG capsule Take 37.5 mg by mouth every morning.    [provider]  pramipexole (MIRAPEX) 0.25 MG tablet Take 0.25 mg by mouth at bedtime.    [provider]  predniSONE (DELTASONE) 50 MG tablet Take 1 tablet (50 mg total) by mouth daily with breakfast. 05/06/18   Kollins Fenter, Charline Bills, PA-C  traZODone (  DESYREL) 50 MG tablet Take 100-150 mg by mouth at bedtime as needed (for restless legs).     [provider]    Allergies Barbital; Flu virus vaccine; Other; Pneumococcal vaccines; Sulfa antibiotics; and Tape  Family History  Problem Relation Age of Onset  . Heart disease Father   . Heart disease Maternal Grandmother   . Heart disease Paternal Grandmother   . Breast cancer Neg Hx     Social History Social History   Tobacco Use  . Smoking status: Never Smoker  . Smokeless tobacco: Never  Used  Substance Use Topics  . Alcohol use: Not Currently    Comment: occasioanaly  . Drug use: No     Review of Systems  Constitutional: No fever/chills Eyes: No visual changes. No discharge ENT: No upper respiratory complaints. Cardiovascular: no chest pain. Respiratory: no cough. No SOB. Gastrointestinal: No abdominal pain.  No nausea, no vomiting.  No diarrhea.  No constipation. Genitourinary: Negative for dysuria. No hematuria Musculoskeletal: Positive for left lateral rib pain Skin: Negative for rash, abrasions, lacerations, ecchymosis. Neurological: Negative for headaches, focal weakness or numbness. 10-point ROS otherwise negative.  ____________________________________________   PHYSICAL EXAM:  VITAL SIGNS: ED Triage Vitals  Enc Vitals Group     BP 05/06/18 2100 126/67     Pulse Rate 05/06/18 2100 98     Resp 05/06/18 2100 20     Temp 05/06/18 2100 98.3 F (36.8 C)     Temp Source 05/06/18 2100 Oral     SpO2 05/06/18 2100 99 %     Weight 05/06/18 2058 159 lb (72.1 kg)     Height 05/06/18 2058 5\' 3"  (1.6 m)     Head Circumference --      Peak Flow --      Pain Score 05/06/18 2058 7     Pain Loc --      Pain Edu? --      Excl. in Blanket? --      Constitutional: Alert and oriented. Well appearing and in no acute distress. Eyes: Conjunctivae are normal. PERRL. EOMI. Head: Atraumatic. ENT:      Ears:       Nose: No congestion/rhinnorhea.      Mouth/Throat: Mucous membranes are moist.  Neck: No stridor.  No cervical spine tenderness to palpation.  Cardiovascular: Normal rate, regular rhythm. Normal S1 and S2.  Good peripheral circulation. Respiratory: Normal respiratory effort without tachypnea or retractions. Lungs CTAB. Good air entry to the bases with no decreased or absent breath sounds. Gastrointestinal: Bowel sounds 4 quadrants. Soft and nontender to palpation. No guarding or rigidity. No palpable masses. No distention. No CVA tenderness. Musculoskeletal:  Full range of motion to all extremities. No gross deformities appreciated.  Examination of the left lateral ribs reveals no visible signs of trauma with abrasions, lacerations, ecchymosis.  No paradoxical chest wall movement.  Diffuse tenderness to palpation in ribs 6 through 10 left lateral side.  No palpable abnormality or crepitus.  No subcutaneous emphysema.  Good underlying breath sounds bilaterally. Neurologic:  Normal speech and language. No gross focal neurologic deficits are appreciated.  Skin:  Skin is warm, dry and intact. No rash noted. Psychiatric: Mood and affect are normal. Speech and behavior are normal. Patient exhibits appropriate insight and judgement.   ____________________________________________   LABS (all labs ordered are listed, but only abnormal results are displayed)  Labs Reviewed - No data to display ____________________________________________  EKG   ____________________________________________  RADIOLOGY I personally viewed  and evaluated these images as part of my medical decision making, as well as reviewing the written report by the radiologist.  I concur with radiologist finding of no acute rib fracture.  No pneumothorax.  Dg Ribs Unilateral W/chest Left  Result Date: 05/06/2018 CLINICAL DATA:  Fall with left-sided rib pain EXAM: LEFT RIBS AND CHEST - 3+ VIEW COMPARISON:  05/22/2015 FINDINGS: No fracture or other bone lesions are seen involving the ribs. There is no evidence of pneumothorax or pleural effusion. Both lungs are clear. Heart size and mediastinal contours are within normal limits. IMPRESSION: Negative. Electronically Signed   By: Donavan Foil M.D.   On: 05/06/2018 21:34    ____________________________________________    PROCEDURES  Procedure(s) performed:    Procedures    Medications  HYDROcodone-acetaminophen (NORCO/VICODIN) 5-325 MG per tablet 1 tablet (has no administration in time range)      ____________________________________________   INITIAL IMPRESSION / ASSESSMENT AND PLAN / ED COURSE  Pertinent labs & imaging results that were available during my care of the patient were reviewed by me and considered in my medical decision making (see chart for details).  Review of the [place] CSRS was performed in accordance of the [place] prior to dispensing any controlled drugs.    The patient was evaluated for the symptoms described in the history of present illness. The patient was evaluated in the context of the global COVID-19 pandemic, which necessitated consideration that the patient might be at risk for infection with the SARS-CoV-2 virus that causes COVID-19. Institutional protocols and algorithms that pertain to the evaluation of patients at risk for COVID-19 are in a state of rapid change based on information released by regulatory bodies including the CDC and federal and state organizations. The most current policies and algorithms were followed during the patient's care in the ED.   Patient's vital signs are stable with no significant tachypnea and no significant tachycardia. PERC negative.  Low risk for ACS. No recent high-risk travel or sick contacts that would suggest an increased risk of COVID-19.  This patient does not currently meet CDC criteria for testing and I explained that in detail.  The work-up/exam today is reassuring with no evidence of emergent medical condition that requires further work-up, evaluation, or inpatient treatment other than what has been ordered/performed     Patient's diagnosis is consistent with costochondritis/rib contusion.  Patient presented to the emergency department with ongoing left rib pain after a fall 2 weeks prior.  Overall, exam is reassuring with no acute findings, good underlying breath sounds.  Rib series x-ray reveals no acute osseous abnormality.  No underlying pneumothorax.  Patient will be prescribed short course of prednisone with  additional NSAID for symptom relief.  Follow-up with primary care as needed.. Patient is given ED precautions to return to the ED for any worsening or new symptoms.     ____________________________________________  FINAL CLINICAL IMPRESSION(S) / ED DIAGNOSES  Final diagnoses:  Contusion of rib on left side, initial encounter  Costochondritis, acute      NEW MEDICATIONS STARTED DURING THIS VISIT:  ED Discharge Orders         Ordered    meloxicam (MOBIC) 15 MG tablet  Daily     05/06/18 2202    predniSONE (DELTASONE) 50 MG tablet  Daily with breakfast     05/06/18 2202              This chart was dictated using voice recognition software/Dragon. Despite best efforts to proofread,  errors can occur which can change the meaning. Any change was purely unintentional.    Darletta Moll, PA-C 05/06/18 2204    Nance Pear, MD 05/06/18 2213

## 2018-05-06 NOTE — ED Triage Notes (Signed)
Pt fell 2 weeks ago and hurt left rib area on the night stand.  Today, pt coughed and now pain is worse.  Pt alert.

## 2018-11-25 ENCOUNTER — Other Ambulatory Visit: Payer: Self-pay

## 2018-11-25 DIAGNOSIS — Z20822 Contact with and (suspected) exposure to covid-19: Secondary | ICD-10-CM

## 2018-11-27 LAB — NOVEL CORONAVIRUS, NAA: SARS-CoV-2, NAA: NOT DETECTED

## 2019-01-08 ENCOUNTER — Other Ambulatory Visit: Payer: Self-pay | Admitting: Internal Medicine

## 2019-01-08 DIAGNOSIS — Z1231 Encounter for screening mammogram for malignant neoplasm of breast: Secondary | ICD-10-CM

## 2019-02-09 ENCOUNTER — Other Ambulatory Visit: Payer: Self-pay

## 2019-02-09 ENCOUNTER — Ambulatory Visit
Admission: RE | Admit: 2019-02-09 | Discharge: 2019-02-09 | Disposition: A | Discharge status: Home / Self Care | Payer: BC Managed Care – PPO | Source: Ambulatory Visit | Attending: Internal Medicine | Admitting: Internal Medicine

## 2019-02-09 DIAGNOSIS — Z1231 Encounter for screening mammogram for malignant neoplasm of breast: Secondary | ICD-10-CM | POA: Diagnosis present

## 2019-02-09 HISTORY — DX: Malignant (primary) neoplasm, unspecified: C80.1

## 2019-03-24 ENCOUNTER — Emergency Department: Payer: BC Managed Care – PPO

## 2019-03-24 ENCOUNTER — Encounter: Payer: Self-pay | Admitting: Emergency Medicine

## 2019-03-24 ENCOUNTER — Other Ambulatory Visit: Payer: Self-pay

## 2019-03-24 ENCOUNTER — Emergency Department
Admission: EM | Admit: 2019-03-24 | Discharge: 2019-03-24 | Disposition: A | Discharge status: Home / Self Care | Payer: BC Managed Care – PPO | Attending: Student in an Organized Health Care Education/Training Program | Admitting: Student in an Organized Health Care Education/Training Program

## 2019-03-24 DIAGNOSIS — Z79899 Other long term (current) drug therapy: Secondary | ICD-10-CM | POA: Insufficient documentation

## 2019-03-24 DIAGNOSIS — R0789 Other chest pain: Secondary | ICD-10-CM

## 2019-03-24 LAB — BASIC METABOLIC PANEL
Anion gap: 10 (ref 5–15)
BUN: 12 mg/dL (ref 6–20)
CO2: 29 mmol/L (ref 22–32)
Calcium: 9.7 mg/dL (ref 8.9–10.3)
Chloride: 100 mmol/L (ref 98–111)
Creatinine, Ser: 0.81 mg/dL (ref 0.44–1.00)
GFR calc Af Amer: >60 mL/min (ref >60)
GFR calc non Af Amer: >60 mL/min (ref >60)
Glucose, Bld: 147 mg/dL — ABNORMAL HIGH (ref 70–99)
Potassium: 3.5 mmol/L (ref 3.5–5.1)
Sodium: 139 mmol/L (ref 135–145)

## 2019-03-24 LAB — CBC
HCT: 37.5 % (ref 36.0–46.0)
Hemoglobin: 12.8 g/dL (ref 12.0–15.0)
MCH: 29.8 pg (ref 26.0–34.0)
MCHC: 34.1 g/dL (ref 30.0–36.0)
MCV: 87.2 fL (ref 80.0–100.0)
Platelets: 261 10*3/uL (ref 150–400)
RBC: 4.3 MIL/uL (ref 3.87–5.11)
RDW: 12.3 % (ref 11.5–15.5)
WBC: 12.7 10*3/uL — ABNORMAL HIGH (ref 4.0–10.5)
nRBC: 0 % (ref 0.0–0.2)

## 2019-03-24 LAB — TROPONIN I (HIGH SENSITIVITY)
Troponin I (High Sensitivity): <2 ng/L (ref <18)
Troponin I (High Sensitivity): <2 ng/L (ref <18)

## 2019-03-24 LAB — FIBRIN DERIVATIVES D-DIMER (ARMC ONLY): Fibrin derivatives D-dimer (ARMC): 150.86 ng/mL (FEU) (ref 0.00–499.00)

## 2019-03-24 MED ORDER — HYDROCODONE-ACETAMINOPHEN 5-325 MG PO TABS: 1.0000 | ORAL_TABLET | ORAL | 0 refills | Status: DC | PRN

## 2019-03-24 MED ORDER — HYDROCODONE-ACETAMINOPHEN 5-325 MG PO TABS
1.0000 | ORAL_TABLET | Freq: Once | ORAL | Status: AC
  Administered 2019-03-24: 1 via ORAL
  Filled 2019-03-24: qty 1

## 2019-03-24 MED ORDER — LIDOCAINE VISCOUS HCL 2 % MT SOLN
15.0000 mL | Freq: Once | OROMUCOSAL | Status: AC
  Administered 2019-03-24: 15 mL via ORAL
  Filled 2019-03-24 (×2): qty 15

## 2019-03-24 MED ORDER — ALUM & MAG HYDROXIDE-SIMETH 200-200-20 MG/5ML PO SUSP
30.0000 mL | Freq: Once | ORAL | Status: AC
  Administered 2019-03-24: 30 mL via ORAL
  Filled 2019-03-24 (×2): qty 30

## 2019-03-24 NOTE — ED Provider Notes (Signed)
[facility] Emergency Department Provider Note    First MD Initiated Contact with Patient 03/24/19 1311     (approximate)  I have reviewed the triage vital signs and the nursing notes.   HISTORY  Chief Complaint Chest Pain    HPI Martha Whitney is a 54 y.o. female below listed past medical history presents to the ER for midsternal chest pain and pressure that started this morning after the patient arrived to work.  Did have associated tingling in her fourth and fifth left finger.  States that pain is constant.  Denies any shortness of breath at this time but does have some worsening pain when taking deep inspiration.  Recently put on antibiotics as well as prednisone for sinusitis.  She did test negative for Covid.  Did not have any nausea or vomiting.  There is no clamminess or diaphoresis with the discomfort.  Is never had pain like this before.  Denies any pain radiating through to her neck or back.    Past Medical History:  Diagnosis Date  . Arthritis   . Cancer ([place])    skin ca on face  . Chronic kidney disease    kidney stones  . Difficult intubation   . Endometriosis determined by laparoscopy   . Restless leg syndrome 2007   Family History  Problem Relation Age of Onset  . Heart disease Father   . Heart disease Maternal Grandmother   . Heart disease Paternal Grandmother   . Breast cancer Neg Hx    Past Surgical History:  Procedure Laterality Date  . ABDOMINAL HYSTERECTOMY    . CARPAL TUNNEL RELEASE Left 2003  . CHOLECYSTECTOMY    . CYSTECTOMY N/A 1969   cyst removed from throat  . GANGLION CYST EXCISION Left 10/21/2014   Procedure: REMOVAL GANGLION OF WRIST;  Surgeon: Hessie Knows, MD;  Location: [facility] ORS;  Service: Orthopedics;  Laterality: Left;  . LAPAROSCOPIC SALPINGO OOPHERECTOMY Bilateral 06/22/2014   Procedure: LAPAROSCOPIC SALPINGO OOPHORECTOMY;  Surgeon: Aletha Halim, MD;  Location: [facility] ORS;  Service: Gynecology;   Laterality: Bilateral;  . LAPAROSCOPIC TOTAL HYSTERECTOMY    . LAPAROSCOPY WITH TUBAL LIGATION    . LEEP N/A 1995  . SHOULDER ARTHROSCOPY WITH OPEN ROTATOR CUFF REPAIR Right 07/09/2017   Procedure: SHOULDER ARTHROSCOPY WITH OPEN ROTATOR CUFF REPAIR;  Surgeon: Corky Mull, MD;  Location: [facility] ORS;  Service: Orthopedics;  Laterality: Right;  extensive debridement, decompression, miniopen biceps tenodesis, mini open rotator cuff repair  . TONSILLECTOMY     Patient Active Problem List   Diagnosis Date Noted  . CAP (community acquired pneumonia) 05/23/2015      Prior to Admission medications   Medication Sig Start Date End Date Taking? Authorizing Provider  gabapentin (NEURONTIN) 400 MG capsule Take 400 mg by mouth daily as needed (for restless legs).     [provider]  HYDROcodone-acetaminophen (NORCO) 5-325 MG tablet Take 1 tablet by mouth every 4 (four) hours as needed for moderate pain. 03/24/19   Merlyn Lot, MD  meloxicam (MOBIC) 15 MG tablet Take 1 tablet (15 mg total) by mouth daily. 05/06/18   Cuthriell, Charline Bills, PA-C  oxyCODONE (ROXICODONE) 5 MG immediate release tablet Take 1-2 tablets (5-10 mg total) by mouth every 4 (four) hours as needed. 07/09/17   Poggi, Marshall Cork, MD  phentermine 37.5 MG capsule Take 37.5 mg by mouth every morning.    [provider]  pramipexole (MIRAPEX) 0.25 MG tablet Take 0.25 mg by  mouth at bedtime.    [provider]  predniSONE (DELTASONE) 50 MG tablet Take 1 tablet (50 mg total) by mouth daily with breakfast. 05/06/18   Cuthriell, Charline Bills, PA-C  traZODone (DESYREL) 50 MG tablet Take 100-150 mg by mouth at bedtime as needed (for restless legs).     [provider]    Allergies Barbital, Flu virus vaccine, Sulfa antibiotics, Other, Pneumococcal vaccines, and Tape    Social History Social History   Tobacco Use  . Smoking status: Never Smoker  . Smokeless tobacco: Never Used  Substance Use Topics  .  Alcohol use: Yes  . Drug use: No    Review of Systems Patient denies headaches, rhinorrhea, blurry vision, numbness, shortness of breath, chest pain, edema, cough, abdominal pain, nausea, vomiting, diarrhea, dysuria, fevers, rashes or hallucinations unless otherwise stated above in HPI. ____________________________________________   PHYSICAL EXAM:  VITAL SIGNS: Vitals:   03/24/19 1227  BP: (!) 158/92  Pulse: 95  Resp: 16  Temp: 98.2 F (36.8 C)  SpO2: 100%    Constitutional: Alert and oriented.  Eyes: Conjunctivae are normal.  Head: Atraumatic. Nose: No congestion/rhinnorhea. Mouth/Throat: Mucous membranes are moist.   Neck: No stridor. Painless ROM.  Cardiovascular: Normal rate, regular rhythm. Grossly normal heart sounds.  Good peripheral circulation in all four extremities Respiratory: Normal respiratory effort.  No retractions. Lungs CTAB. Gastrointestinal: Soft and nontender. No distention. No abdominal bruits. No CVA tenderness. Genitourinary:  Musculoskeletal: pain reproduced with palpation of anterior left chest wall.   No lower extremity tenderness nor edema.  No joint effusions. Neurologic:  Normal speech and language. No gross focal neurologic deficits are appreciated. No facial droop Skin:  Skin is warm, dry and intact. No rash noted. Psychiatric: Mood and affect are normal. Speech and behavior are normal.  ____________________________________________   LABS (all labs ordered are listed, but only abnormal results are displayed)  Results for orders placed or performed during the hospital encounter of 03/24/19 (from the past 24 hour(s))  Basic metabolic panel     Status: Abnormal   Collection Time: 03/24/19 12:21 PM  Result Value Ref Range   Sodium 139 135 - 145 mmol/L   Potassium 3.5 3.5 - 5.1 mmol/L   Chloride 100 98 - 111 mmol/L   CO2 29 22 - 32 mmol/L   Glucose, Bld 147 (H) 70 - 99 mg/dL   BUN 12 6 - 20 mg/dL   Creatinine, Ser 0.81 0.44 - 1.00 mg/dL     Calcium 9.7 8.9 - 10.3 mg/dL   GFR calc non Af Amer >60 >60 mL/min   GFR calc Af Amer >60 >60 mL/min   Anion gap 10 5 - 15  CBC     Status: Abnormal   Collection Time: 03/24/19 12:21 PM  Result Value Ref Range   WBC 12.7 (H) 4.0 - 10.5 K/uL   RBC 4.30 3.87 - 5.11 MIL/uL   Hemoglobin 12.8 12.0 - 15.0 g/dL   HCT 37.5 36.0 - 46.0 %   MCV 87.2 80.0 - 100.0 fL   MCH 29.8 26.0 - 34.0 pg   MCHC 34.1 30.0 - 36.0 g/dL   RDW 12.3 11.5 - 15.5 %   Platelets 261 150 - 400 K/uL   nRBC 0.0 0.0 - 0.2 %  Troponin I (High Sensitivity)     Status: None   Collection Time: 03/24/19 12:21 PM  Result Value Ref Range   Troponin I (High Sensitivity) <2 <18 ng/L  Fibrin derivatives [facility]  only)     Status: None   Collection Time: 03/24/19  1:58 PM  Result Value Ref Range   Fibrin derivatives D-dimer ([facility]) 150.86 0.00 - 499.00 ng/mL (FEU)  Troponin I (High Sensitivity)     Status: None   Collection Time: 03/24/19  1:59 PM  Result Value Ref Range   Troponin I (High Sensitivity) <2 <18 ng/L   ____________________________________________  EKG My review and personal interpretation at Time: 12:25   Indication: chest pain  Rate: 95  Rhythm: sinus Axis: normal Other: normal intervals, no stemi, no depressions ____________________________________________  RADIOLOGY  I personally reviewed all radiographic images ordered to evaluate for the above acute complaints and reviewed radiology reports and findings.  These findings were personally discussed with the patient.  Please see medical record for radiology report.  ____________________________________________   PROCEDURES  Procedure(s) performed:  Procedures    Critical Care performed: no ____________________________________________   INITIAL IMPRESSION / ASSESSMENT AND PLAN / ED COURSE  Pertinent labs & imaging results that were available during my care of the patient were reviewed by me and considered in my medical decision making  (see chart for details).   DDX: ACS, pericarditis, esophagitis, boerhaaves, pe, dissection, pna, bronchitis, costochondritis   KALI ARITA is a 54 y.o. who presents to the ED with symptoms as described above.  Patient clinically very well-appearing in no acute distress.  EKG is nonischemic.  Initial troponin is negative.  Seems less consistent with ACS though she does have risk factors which will require monitoring and serial enzymes.  Do want to order D-dimer to further stratify for PE but patient is otherwise low risk by Wells criteria.  Does not have any pain radiating through to her back.  This does not seem consistent with dissection.  She is complaining of tingling only in the fourth and fifth digit.  Possible neuropathy or ulnar nerve impingement.  There is good distal blood flow.  Abdominal exam benign.  May also be having some form of reflux esophagitis particular in the setting of recent prednisone initiation.  Clinical Course as of Mar 24 1534  Tue Mar 24, 2019  1449 Serial enzymes are negative.  This does not seem consistent with ACS.  D-dimer is negative.     [PR]  1531 Patient felt some improvement with GI cocktail.  May be a component of gastritis or reflux as she was recently course of prednisone.  Her abdominal exam is soft and benign.  She remains well and nontoxic.  Her heart score is 2.  Repeat exam pain seems to be localized to the left anterior chest wall and is exhibiting reproducible chest discomfort.  May be component of pleurisy given recent URI.  No evidence of pna.  Borderline leukocytosis likely secondary to recent steroid taper.  Given negative enzymes I do believe she is stable and appropriate for outpatient follow-up.   [PR]    Clinical Course User Index [PR] Merlyn Lot, MD    The patient was evaluated in Emergency Department today for the symptoms described in the history of present illness. He/she was evaluated in the context of the global COVID-19  pandemic, which necessitated consideration that the patient might be at risk for infection with the SARS-CoV-2 virus that causes COVID-19. Institutional protocols and algorithms that pertain to the evaluation of patients at risk for COVID-19 are in a state of rapid change based on information released by regulatory bodies including the CDC and federal and state organizations. These policies and  algorithms were followed during the patient's care in the ED.  As part of my medical decision making, I reviewed the following data within the [place] notes reviewed and incorporated, Labs reviewed, notes from prior ED visits and [place] Controlled Substance Database   ____________________________________________   FINAL CLINICAL IMPRESSION(S) / ED DIAGNOSES  Final diagnoses:  Atypical chest pain      NEW MEDICATIONS STARTED DURING THIS VISIT:  New Prescriptions   HYDROCODONE-ACETAMINOPHEN (NORCO) 5-325 MG TABLET    Take 1 tablet by mouth every 4 (four) hours as needed for moderate pain.     Note:  This document was prepared using Dragon voice recognition software and may include unintentional dictation errors.    Merlyn Lot, MD 03/24/19 1536

## 2019-03-24 NOTE — ED Triage Notes (Signed)
Pt in via POV, reports onset centralized chest pain this morning, denies any associated symptoms.  NAD noted at this time.

## 2019-12-07 ENCOUNTER — Other Ambulatory Visit: Payer: Self-pay | Admitting: Student

## 2019-12-07 DIAGNOSIS — S46011A Strain of muscle(s) and tendon(s) of the rotator cuff of right shoulder, initial encounter: Secondary | ICD-10-CM

## 2019-12-07 DIAGNOSIS — Z9889 Other specified postprocedural states: Secondary | ICD-10-CM

## 2019-12-07 DIAGNOSIS — M19011 Primary osteoarthritis, right shoulder: Secondary | ICD-10-CM

## 2019-12-08 ENCOUNTER — Other Ambulatory Visit: Payer: Self-pay | Admitting: Surgery

## 2019-12-11 ENCOUNTER — Ambulatory Visit
Admission: RE | Admit: 2019-12-11 | Discharge: 2019-12-11 | Disposition: A | Discharge status: Home / Self Care | Payer: BC Managed Care – PPO | Source: Ambulatory Visit | Attending: Student | Admitting: Student

## 2019-12-11 ENCOUNTER — Other Ambulatory Visit: Payer: Self-pay

## 2019-12-11 DIAGNOSIS — Z9889 Other specified postprocedural states: Secondary | ICD-10-CM | POA: Insufficient documentation

## 2019-12-11 DIAGNOSIS — S46011A Strain of muscle(s) and tendon(s) of the rotator cuff of right shoulder, initial encounter: Secondary | ICD-10-CM

## 2019-12-11 DIAGNOSIS — M19011 Primary osteoarthritis, right shoulder: Secondary | ICD-10-CM | POA: Insufficient documentation

## 2020-01-25 ENCOUNTER — Other Ambulatory Visit: Payer: Self-pay | Admitting: Internal Medicine

## 2020-01-25 DIAGNOSIS — Z1231 Encounter for screening mammogram for malignant neoplasm of breast: Secondary | ICD-10-CM

## 2020-02-04 ENCOUNTER — Other Ambulatory Visit: Payer: Self-pay | Admitting: Surgery

## 2020-02-08 ENCOUNTER — Other Ambulatory Visit: Payer: Self-pay

## 2020-02-08 ENCOUNTER — Encounter
Admission: RE | Admit: 2020-02-08 | Discharge: 2020-02-08 | Disposition: A | Discharge status: Home / Self Care | Payer: BC Managed Care – PPO | Source: Ambulatory Visit | Attending: Surgery | Admitting: Surgery

## 2020-02-08 DIAGNOSIS — Z01818 Encounter for other preprocedural examination: Secondary | ICD-10-CM | POA: Insufficient documentation

## 2020-02-08 HISTORY — DX: Personal history of urinary calculi: Z87.442

## 2020-02-08 HISTORY — DX: Gastro-esophageal reflux disease without esophagitis: K21.9

## 2020-02-08 HISTORY — DX: Type 2 diabetes mellitus without complications: E11.9

## 2020-02-08 LAB — URINALYSIS, ROUTINE W REFLEX MICROSCOPIC
Bacteria, UA: NONE SEEN
Bilirubin Urine: NEGATIVE
Glucose, UA: NEGATIVE mg/dL
Ketones, ur: NEGATIVE mg/dL
Nitrite: NEGATIVE
Protein, ur: NEGATIVE mg/dL
Specific Gravity, Urine: 1.027 (ref 1.005–1.030)
WBC, UA: NONE SEEN WBC/hpf (ref 0–5)
pH: 5 (ref 5.0–8.0)

## 2020-02-08 LAB — COMPREHENSIVE METABOLIC PANEL
ALT: 19 U/L (ref 0–44)
AST: 18 U/L (ref 15–41)
Albumin: 4.2 g/dL (ref 3.5–5.0)
Alkaline Phosphatase: 75 U/L (ref 38–126)
Anion gap: 8 (ref 5–15)
BUN: 15 mg/dL (ref 6–20)
CO2: 27 mmol/L (ref 22–32)
Calcium: 8.9 mg/dL (ref 8.9–10.3)
Chloride: 105 mmol/L (ref 98–111)
Creatinine, Ser: 1.06 mg/dL — ABNORMAL HIGH (ref 0.44–1.00)
GFR, Estimated: >60 mL/min (ref >60)
Glucose, Bld: 104 mg/dL — ABNORMAL HIGH (ref 70–99)
Potassium: 3.6 mmol/L (ref 3.5–5.1)
Sodium: 140 mmol/L (ref 135–145)
Total Bilirubin: 0.5 mg/dL (ref 0.3–1.2)
Total Protein: 6.9 g/dL (ref 6.5–8.1)

## 2020-02-08 LAB — CBC WITH DIFFERENTIAL/PLATELET
Abs Immature Granulocytes: 0.04 10*3/uL (ref 0.00–0.07)
Basophils Absolute: 0 10*3/uL (ref 0.0–0.1)
Basophils Relative: 0 %
Eosinophils Absolute: 0 10*3/uL (ref 0.0–0.5)
Eosinophils Relative: 0 %
HCT: 40.4 % (ref 36.0–46.0)
Hemoglobin: 13.6 g/dL (ref 12.0–15.0)
Immature Granulocytes: 0 %
Lymphocytes Relative: 31 %
Lymphs Abs: 3.2 10*3/uL (ref 0.7–4.0)
MCH: 29.1 pg (ref 26.0–34.0)
MCHC: 33.7 g/dL (ref 30.0–36.0)
MCV: 86.3 fL (ref 80.0–100.0)
Monocytes Absolute: 0.7 10*3/uL (ref 0.1–1.0)
Monocytes Relative: 6 %
Neutro Abs: 6.4 10*3/uL (ref 1.7–7.7)
Neutrophils Relative %: 63 %
Platelets: 277 10*3/uL (ref 150–400)
RBC: 4.68 MIL/uL (ref 3.87–5.11)
RDW: 12.5 % (ref 11.5–15.5)
WBC: 10.3 10*3/uL (ref 4.0–10.5)
nRBC: 0 % (ref 0.0–0.2)

## 2020-02-08 LAB — SURGICAL PCR SCREEN
MRSA, PCR: NEGATIVE
Staphylococcus aureus: POSITIVE — AB

## 2020-02-08 LAB — TYPE AND SCREEN
ABO/RH(D): A POS
Antibody Screen: NEGATIVE

## 2020-02-08 NOTE — Patient Instructions (Addendum)
Your procedure is scheduled on: 02-18-20 THURSDAY Report to the Registration Desk on the 1st floor of the [facility]-Then proceed to the 2nd floor Surgery Desk in the [place] To find out your arrival time, please call [phone] between 1PM - 3PM on:02-17-20 WEDNESDAY  REMEMBER: Instructions that are not followed completely may result in serious medical risk, up to and including death; or upon the discretion of your surgeon and anesthesiologist your surgery may need to be rescheduled.  Do not eat food after midnight the night before surgery.  No gum chewing, lozengers or hard candies.  You may however, drink WATER up to 2 hours before you are scheduled to arrive for your surgery. Do not drink anything within 2 hours of your scheduled arrival time.  Type 1 and Type 2 diabetics should only drink water.  TAKE THESE MEDICATIONS THE MORNING OF SURGERY WITH A SIP OF WATER: -PROTONIX (PANTOPRAZOLE)-take one the night before and one on the morning of surgery - helps to prevent nausea after surgery.)  One week prior to surgery: Stop Anti-inflammatories (NSAIDS) such as Advil, Aleve, Ibuprofen, Motrin, Naproxen, Naprosyn and Aspirin based products such as Excedrin, Goodys Powder, BC Powder-OK TO TAKE TYLENOL IF NEEDED  Stop ANY OVER THE COUNTER supplements until after surgery.  No Alcohol for 24 hours before or after surgery.  No Smoking including e-cigarettes for 24 hours prior to surgery.  No chewable tobacco products for at least 6 hours prior to surgery.  No nicotine patches on the day of surgery.  Do not use any "recreational" drugs for at least a week prior to your surgery.  Please be advised that the combination of cocaine and anesthesia may have negative outcomes, up to and including death. If you test positive for cocaine, your surgery will be cancelled.  On the morning of surgery brush your teeth with toothpaste and water, you may rinse your mouth with mouthwash if you  wish. Do not swallow any toothpaste or mouthwash.  Do not wear jewelry, make-up, hairpins, clips or nail polish.  Do not wear lotions, powders, or perfumes.   Do not shave body from the neck down 48 hours prior to surgery just in case you cut yourself which could leave a site for infection.  Also, freshly shaved skin may become irritated if using the CHG soap.  Contact lenses, hearing aids and dentures may not be worn into surgery.  Do not bring valuables to the hospital. [facility] is not responsible for any missing/lost belongings or valuables.   Use CHG Soap/BENZOYL PEROXIDE as directed on instruction sheet.  Notify your doctor if there is any change in your medical condition (cold, fever, infection).  Wear comfortable clothing (specific to your surgery type) to the hospital.  Plan for stool softeners for home use; pain medications have a tendency to cause constipation. You can also help prevent constipation by eating foods high in fiber such as fruits and vegetables and drinking plenty of fluids as your diet allows.  After surgery, you can help prevent lung complications by doing breathing exercises.  Take deep breaths and cough every 1-2 hours. Your doctor may order a device called an Incentive Spirometer to help you take deep breaths. When coughing or sneezing, hold a pillow firmly against your incision with both hands. This is called "splinting." Doing this helps protect your incision. It also decreases belly discomfort.  If you are being admitted to the hospital overnight, leave your suitcase in the car. After surgery it may  be brought to your room.  If you are being discharged the day of surgery, you will not be allowed to drive home. You will need a responsible adult (18 years or older) to drive you home and stay with you that night.   If you are taking public transportation, you will need to have a responsible adult (18 years or older) with you. Please confirm with your  physician that it is acceptable to use public transportation.   Please call the [place] [facility]. at [phone] if you have any questions about these instructions.  Visitation Policy:  Patients undergoing a surgery or procedure may have one family member or support person with them as long as that person is not COVID-19 positive or experiencing its symptoms.  That person may remain in the waiting area during the procedure.  Inpatient Visitation:    Visiting hours are 7 a.m. to 8 p.m. Patients will be allowed one visitor. The visitor may change daily. The visitor must pass COVID-19 screenings, use hand sanitizer when entering and exiting the patient's room and wear a mask at all times, including in the patient's room. Patients must also wear a mask when staff or their visitor are in the room. Masking is required regardless of vaccination status. Systemwide, no visitors 17 or younger.

## 2020-02-16 ENCOUNTER — Other Ambulatory Visit
Admission: RE | Admit: 2020-02-16 | Discharge: 2020-02-16 | Disposition: A | Discharge status: Home / Self Care | Payer: BC Managed Care – PPO | Source: Ambulatory Visit | Attending: Surgery | Admitting: Surgery

## 2020-02-16 ENCOUNTER — Other Ambulatory Visit: Payer: Self-pay

## 2020-02-16 DIAGNOSIS — Z888 Allergy status to other drugs, medicaments and biological substances status: Secondary | ICD-10-CM | POA: Diagnosis not present

## 2020-02-16 DIAGNOSIS — Z20822 Contact with and (suspected) exposure to covid-19: Secondary | ICD-10-CM | POA: Insufficient documentation

## 2020-02-16 DIAGNOSIS — M19011 Primary osteoarthritis, right shoulder: Secondary | ICD-10-CM | POA: Diagnosis present

## 2020-02-16 DIAGNOSIS — Z79899 Other long term (current) drug therapy: Secondary | ICD-10-CM | POA: Diagnosis not present

## 2020-02-16 DIAGNOSIS — Z882 Allergy status to sulfonamides status: Secondary | ICD-10-CM | POA: Diagnosis not present

## 2020-02-16 DIAGNOSIS — Z885 Allergy status to narcotic agent status: Secondary | ICD-10-CM | POA: Diagnosis not present

## 2020-02-16 DIAGNOSIS — Z01812 Encounter for preprocedural laboratory examination: Secondary | ICD-10-CM | POA: Insufficient documentation

## 2020-02-16 LAB — SARS CORONAVIRUS 2 (TAT 6-24 HRS): SARS Coronavirus 2: NEGATIVE

## 2020-02-18 ENCOUNTER — Ambulatory Visit: Payer: BC Managed Care – PPO | Admitting: Urgent Care

## 2020-02-18 ENCOUNTER — Ambulatory Visit: Payer: BC Managed Care – PPO

## 2020-02-18 ENCOUNTER — Ambulatory Visit: Payer: BC Managed Care – PPO | Admitting: Anesthesiology

## 2020-02-18 ENCOUNTER — Encounter: Payer: Self-pay | Admitting: Surgery

## 2020-02-18 ENCOUNTER — Encounter
Admission: RE | Disposition: A | Discharge status: Home / Self Care | Payer: Self-pay | Source: Home / Self Care | Attending: Surgery

## 2020-02-18 ENCOUNTER — Other Ambulatory Visit: Payer: Self-pay

## 2020-02-18 ENCOUNTER — Ambulatory Visit
Admission: RE | Admit: 2020-02-18 | Discharge: 2020-02-18 | Disposition: A | Discharge status: Home / Self Care | Payer: BC Managed Care – PPO | Attending: Surgery | Admitting: Surgery

## 2020-02-18 DIAGNOSIS — Z882 Allergy status to sulfonamides status: Secondary | ICD-10-CM | POA: Insufficient documentation

## 2020-02-18 DIAGNOSIS — M19011 Primary osteoarthritis, right shoulder: Secondary | ICD-10-CM | POA: Diagnosis not present

## 2020-02-18 DIAGNOSIS — Z419 Encounter for procedure for purposes other than remedying health state, unspecified: Secondary | ICD-10-CM

## 2020-02-18 DIAGNOSIS — Z79899 Other long term (current) drug therapy: Secondary | ICD-10-CM | POA: Insufficient documentation

## 2020-02-18 DIAGNOSIS — Z885 Allergy status to narcotic agent status: Secondary | ICD-10-CM | POA: Insufficient documentation

## 2020-02-18 DIAGNOSIS — Z888 Allergy status to other drugs, medicaments and biological substances status: Secondary | ICD-10-CM | POA: Insufficient documentation

## 2020-02-18 DIAGNOSIS — Z20822 Contact with and (suspected) exposure to covid-19: Secondary | ICD-10-CM | POA: Insufficient documentation

## 2020-02-18 DIAGNOSIS — Z96611 Presence of right artificial shoulder joint: Secondary | ICD-10-CM

## 2020-02-18 HISTORY — PX: TOTAL SHOULDER ARTHROPLASTY: SHX126

## 2020-02-18 LAB — GLUCOSE, CAPILLARY
Glucose-Capillary: 160 mg/dL — ABNORMAL HIGH (ref 70–99)
Glucose-Capillary: 135 mg/dL — ABNORMAL HIGH (ref 70–99)

## 2020-02-18 SURGERY — ARTHROPLASTY, SHOULDER, TOTAL
Anesthesia: Regional | Site: Shoulder | Laterality: Right

## 2020-02-18 MED ORDER — CHLORHEXIDINE GLUCONATE 0.12 % MT SOLN: 15.0000 mL | Freq: Once | OROMUCOSAL | Status: AC

## 2020-02-18 MED ORDER — OXYCODONE HCL 5 MG PO TABS: 5.0000 mg | ORAL_TABLET | Freq: Once | ORAL | Status: DC | PRN

## 2020-02-18 MED ORDER — LORAZEPAM 2 MG/ML IJ SOLN: 1.0000 mg | Freq: Once | INTRAMUSCULAR | Status: DC | PRN

## 2020-02-18 MED ORDER — FENTANYL CITRATE (PF) 100 MCG/2ML IJ SOLN
INTRAMUSCULAR | Status: AC
  Administered 2020-02-18: 100 ug via INTRAVENOUS
  Filled 2020-02-18: qty 2

## 2020-02-18 MED ORDER — ACETAMINOPHEN 500 MG PO TABS: 1000.0000 mg | ORAL_TABLET | Freq: Four times a day (QID) | ORAL | Status: DC

## 2020-02-18 MED ORDER — METOCLOPRAMIDE HCL 5 MG/ML IJ SOLN: 5.0000 mg | Freq: Three times a day (TID) | INTRAMUSCULAR | Status: DC | PRN

## 2020-02-18 MED ORDER — BUPIVACAINE-EPINEPHRINE (PF) 0.5% -1:200000 IJ SOLN
INTRAMUSCULAR | Status: DC | PRN
  Administered 2020-02-18: 30 mL via PERINEURAL

## 2020-02-18 MED ORDER — ONDANSETRON HCL 4 MG/2ML IJ SOLN
INTRAMUSCULAR | Status: DC | PRN
  Administered 2020-02-18: 4 mg via INTRAVENOUS

## 2020-02-18 MED ORDER — ONDANSETRON HCL 4 MG PO TABS: 4.0000 mg | ORAL_TABLET | Freq: Four times a day (QID) | ORAL | Status: DC | PRN

## 2020-02-18 MED ORDER — DROPERIDOL 2.5 MG/ML IJ SOLN
0.6250 mg | Freq: Once | INTRAMUSCULAR | Status: DC | PRN
  Filled 2020-02-18: qty 2

## 2020-02-18 MED ORDER — PHENYLEPHRINE HCL (PRESSORS) 10 MG/ML IV SOLN
INTRAVENOUS | Status: AC
  Filled 2020-02-18: qty 1

## 2020-02-18 MED ORDER — CEFAZOLIN SODIUM-DEXTROSE 2-4 GM/100ML-% IV SOLN
2.0000 g | Freq: Four times a day (QID) | INTRAVENOUS | Status: DC
  Administered 2020-02-18: 2 g via INTRAVENOUS

## 2020-02-18 MED ORDER — HYDROMORPHONE HCL 1 MG/ML IJ SOLN: 0.2500 mg | INTRAMUSCULAR | Status: DC | PRN

## 2020-02-18 MED ORDER — BUPIVACAINE HCL (PF) 0.5 % IJ SOLN
INTRAMUSCULAR | Status: AC
  Filled 2020-02-18: qty 10

## 2020-02-18 MED ORDER — TRANEXAMIC ACID 1000 MG/10ML IV SOLN
INTRAVENOUS | Status: AC
  Filled 2020-02-18: qty 10

## 2020-02-18 MED ORDER — SUGAMMADEX SODIUM 200 MG/2ML IV SOLN
INTRAVENOUS | Status: DC | PRN
  Administered 2020-02-18: 200 mg via INTRAVENOUS

## 2020-02-18 MED ORDER — MEPERIDINE HCL 50 MG/ML IJ SOLN
6.2500 mg | INTRAMUSCULAR | Status: DC | PRN
Start: 2020-02-18 — End: 2020-02-18

## 2020-02-18 MED ORDER — ROCURONIUM BROMIDE 100 MG/10ML IV SOLN
INTRAVENOUS | Status: DC | PRN
  Administered 2020-02-18: 60 mg via INTRAVENOUS

## 2020-02-18 MED ORDER — SODIUM CHLORIDE 0.9 % IV SOLN
INTRAVENOUS | Status: DC | PRN
  Administered 2020-02-18: 100 mL via TOPICAL

## 2020-02-18 MED ORDER — PROPOFOL 10 MG/ML IV BOLUS
INTRAVENOUS | Status: DC | PRN
  Administered 2020-02-18: 150 mg via INTRAVENOUS

## 2020-02-18 MED ORDER — SODIUM CHLORIDE 0.9 % IV SOLN
INTRAVENOUS | Status: DC | PRN
  Administered 2020-02-18: 50 ug/min via INTRAVENOUS

## 2020-02-18 MED ORDER — PROPOFOL 10 MG/ML IV BOLUS
INTRAVENOUS | Status: AC
  Filled 2020-02-18: qty 40

## 2020-02-18 MED ORDER — ONDANSETRON HCL 4 MG/2ML IJ SOLN: 4.0000 mg | Freq: Four times a day (QID) | INTRAMUSCULAR | Status: DC | PRN

## 2020-02-18 MED ORDER — OXYCODONE HCL 5 MG PO TABS: 5.0000 mg | ORAL_TABLET | ORAL | 0 refills | Status: DC | PRN

## 2020-02-18 MED ORDER — KETOROLAC TROMETHAMINE 30 MG/ML IJ SOLN: 30.0000 mg | Freq: Once | INTRAMUSCULAR | Status: AC

## 2020-02-18 MED ORDER — BUPIVACAINE HCL (PF) 0.5 % IJ SOLN
INTRAMUSCULAR | Status: AC
  Filled 2020-02-18: qty 30

## 2020-02-18 MED ORDER — OXYCODONE HCL 5 MG/5ML PO SOLN
5.0000 mg | Freq: Once | ORAL | Status: DC | PRN
Start: 2020-02-18 — End: 2020-02-18

## 2020-02-18 MED ORDER — KETOROLAC TROMETHAMINE 15 MG/ML IJ SOLN: 15.0000 mg | Freq: Four times a day (QID) | INTRAMUSCULAR | Status: DC

## 2020-02-18 MED ORDER — EPINEPHRINE PF 1 MG/ML IJ SOLN
INTRAMUSCULAR | Status: AC
  Filled 2020-02-18: qty 1

## 2020-02-18 MED ORDER — POTASSIUM CHLORIDE IN NACL 20-0.9 MEQ/L-% IV SOLN
INTRAVENOUS | Status: DC
  Filled 2020-02-18 (×3): qty 1000

## 2020-02-18 MED ORDER — SODIUM CHLORIDE 0.9 % IV SOLN: INTRAVENOUS | Status: DC

## 2020-02-18 MED ORDER — METOCLOPRAMIDE HCL 10 MG PO TABS
5.0000 mg | ORAL_TABLET | Freq: Three times a day (TID) | ORAL | Status: DC | PRN
Start: 2020-02-18 — End: 2020-02-18

## 2020-02-18 MED ORDER — BUPIVACAINE LIPOSOME 1.3 % IJ SUSP
INTRAMUSCULAR | Status: AC
  Filled 2020-02-18: qty 20

## 2020-02-18 MED ORDER — MIDAZOLAM HCL 2 MG/2ML IJ SOLN
INTRAMUSCULAR | Status: AC
  Administered 2020-02-18: 2 mg via INTRAVENOUS
  Filled 2020-02-18: qty 2

## 2020-02-18 MED ORDER — PROMETHAZINE HCL 25 MG/ML IJ SOLN: 6.2500 mg | INTRAMUSCULAR | Status: DC | PRN

## 2020-02-18 MED ORDER — CEFAZOLIN SODIUM-DEXTROSE 2-4 GM/100ML-% IV SOLN
2.0000 g | INTRAVENOUS | Status: AC
  Administered 2020-02-18: 2 g via INTRAVENOUS

## 2020-02-18 MED ORDER — PHENYLEPHRINE HCL (PRESSORS) 10 MG/ML IV SOLN
INTRAVENOUS | Status: DC | PRN
  Administered 2020-02-18 (×3): 100 ug via INTRAVENOUS

## 2020-02-18 MED ORDER — ORAL CARE MOUTH RINSE: 15.0000 mL | Freq: Once | OROMUCOSAL | Status: AC

## 2020-02-18 MED ORDER — KETOROLAC TROMETHAMINE 30 MG/ML IJ SOLN
INTRAMUSCULAR | Status: AC
  Administered 2020-02-18: 30 mg via INTRAVENOUS
  Filled 2020-02-18: qty 1

## 2020-02-18 MED ORDER — OXYCODONE HCL 5 MG PO TABS: 5.0000 mg | ORAL_TABLET | ORAL | Status: DC | PRN

## 2020-02-18 MED ORDER — CEFAZOLIN SODIUM-DEXTROSE 2-4 GM/100ML-% IV SOLN
INTRAVENOUS | Status: AC
  Filled 2020-02-18: qty 100

## 2020-02-18 MED ORDER — CHLORHEXIDINE GLUCONATE 0.12 % MT SOLN
OROMUCOSAL | Status: AC
  Administered 2020-02-18: 15 mL via OROMUCOSAL
  Filled 2020-02-18: qty 15

## 2020-02-18 MED ORDER — FENTANYL CITRATE (PF) 100 MCG/2ML IJ SOLN: 50.0000 ug | Freq: Once | INTRAMUSCULAR | Status: AC

## 2020-02-18 MED ORDER — TRANEXAMIC ACID 1000 MG/10ML IV SOLN
INTRAVENOUS | Status: DC | PRN
  Administered 2020-02-18: 1000 mg via INTRAVENOUS

## 2020-02-18 MED ORDER — MIDAZOLAM HCL 2 MG/2ML IJ SOLN: 1.0000 mg | Freq: Once | INTRAMUSCULAR | Status: AC

## 2020-02-18 MED ORDER — LACTATED RINGERS IV SOLN: INTRAVENOUS | Status: DC | PRN

## 2020-02-18 SURGICAL SUPPLY — 77 items
ADPR HD STD TPR HUM TI RVRS (Orthopedic Implant) ×1 IMPLANT
APL PRP STRL LF DISP 70% ISPRP (MISCELLANEOUS) ×1
BAG DECANTER FOR FLEXI CONT (MISCELLANEOUS) ×2 IMPLANT
BLADE SAGITTAL WIDE XTHICK NO (BLADE) ×4 IMPLANT
BOWL CEMENT MIX W/ADAPTER (MISCELLANEOUS) ×2 IMPLANT
CANISTER SUCT 1200ML W/VALVE (MISCELLANEOUS) ×2 IMPLANT
CANISTER SUCT 3000ML PPV (MISCELLANEOUS) ×4 IMPLANT
CEMENT BONE R 1X40 (Cement) ×2 IMPLANT
CHLORAPREP W/TINT 26 (MISCELLANEOUS) ×2 IMPLANT
COOLER POLAR GLACIER W/PUMP (MISCELLANEOUS) ×2 IMPLANT
COVER BACK TABLE REUSABLE LG (DRAPES) ×2 IMPLANT
COVER WAND RF STERILE (DRAPES) ×2 IMPLANT
DRAPE 3/4 80X56 (DRAPES) ×4 IMPLANT
DRAPE IMP U-DRAPE 54X76 (DRAPES) ×4 IMPLANT
DRAPE INCISE IOBAN 66X45 STRL (DRAPES) ×4 IMPLANT
DRSG OPSITE POSTOP 4X8 (GAUZE/BANDAGES/DRESSINGS) ×2 IMPLANT
ELECT BLADE 6.5 EXT (BLADE) ×2 IMPLANT
ELECT CAUTERY BLADE 6.4 (BLADE) ×2 IMPLANT
ELECT REM PT RETURN 9FT ADLT (ELECTROSURGICAL) ×2
ELECTRODE REM PT RTRN 9FT ADLT (ELECTROSURGICAL) ×1 IMPLANT
GAUZE PACK 2X3YD (PACKING) ×2 IMPLANT
GAUZE XEROFORM 1X8 LF (GAUZE/BANDAGES/DRESSINGS) ×2 IMPLANT
GLENOID PEGGED HYBRID SM 4MM (Orthopedic Implant) ×2 IMPLANT
GLENOID POST REGENREX HYBRID (Orthopedic Implant) ×2 IMPLANT
GLOVE INDICATOR 8.0 STRL GRN (GLOVE) ×2 IMPLANT
GLOVE SRG 8 PF TXTR STRL LF DI (GLOVE) ×1 IMPLANT
GLOVE SURG ENC MOIS LTX SZ7.5 (GLOVE) ×8 IMPLANT
GLOVE SURG ENC MOIS LTX SZ8 (GLOVE) ×8 IMPLANT
GLOVE SURG UNDER POLY LF SZ8 (GLOVE) ×2
GOWN STRL REUS W/ TWL LRG LVL3 (GOWN DISPOSABLE) ×1 IMPLANT
GOWN STRL REUS W/ TWL XL LVL3 (GOWN DISPOSABLE) ×1 IMPLANT
GOWN STRL REUS W/TWL LRG LVL3 (GOWN DISPOSABLE) ×2
GOWN STRL REUS W/TWL XL LVL3 (GOWN DISPOSABLE) ×2
HEAD HUMERAL BIPOLAR 42X18X46 (Orthopedic Implant) ×1 IMPLANT
HEAD HUMERAL COMP STD (Orthopedic Implant) ×1 IMPLANT
HOOD PEEL AWAY FLYTE STAYCOOL (MISCELLANEOUS) ×8 IMPLANT
HUMERAL HEAD BIPOLAR 42X18X46 (Orthopedic Implant) ×2 IMPLANT
HUMERAL HEAD COMP STD (Orthopedic Implant) ×2 IMPLANT
ILLUMINATOR WAVEGUIDE N/F (MISCELLANEOUS) IMPLANT
IV NS 100ML SINGLE PACK (IV SOLUTION) ×2 IMPLANT
KIT STABILIZATION SHOULDER (MISCELLANEOUS) ×2 IMPLANT
MANIFOLD NEPTUNE II (INSTRUMENTS) ×2 IMPLANT
MASK FACE SPIDER DISP (MASK) ×2 IMPLANT
MAT ABSORB  FLUID 56X50 GRAY (MISCELLANEOUS) ×1
MAT ABSORB FLUID 56X50 GRAY (MISCELLANEOUS) ×1 IMPLANT
NDL SAFETY ECLIPSE 18X1.5 (NEEDLE) ×1 IMPLANT
NEEDLE HYPO 18GX1.5 SHARP (NEEDLE) ×2
NEEDLE SPNL 20GX3.5 QUINCKE YW (NEEDLE) ×2 IMPLANT
NS IRRIG 500ML POUR BTL (IV SOLUTION) ×2 IMPLANT
PACK ARTHROSCOPY SHOULDER (MISCELLANEOUS) ×2 IMPLANT
PAD WRAPON POLAR SHDR UNIV (MISCELLANEOUS) ×1 IMPLANT
PENCIL SMOKE EVACUATOR (MISCELLANEOUS) ×2 IMPLANT
PIN HUMERAL STMN 3.2MMX9IN (INSTRUMENTS) ×2 IMPLANT
PIN STEINMANN NAMO 7 (PIN) ×2 IMPLANT
PULSAVAC PLUS IRRIG FAN TIP (DISPOSABLE) ×2
SLING ULTRA II M (MISCELLANEOUS) IMPLANT
SOL .9 NS 3000ML IRR  AL (IV SOLUTION) ×1
SOL .9 NS 3000ML IRR AL (IV SOLUTION) ×1
SOL .9 NS 3000ML IRR UROMATIC (IV SOLUTION) ×1 IMPLANT
SPONGE LAP 18X18 RF (DISPOSABLE) ×2 IMPLANT
STAPLER SKIN PROX 35W (STAPLE) ×2 IMPLANT
STRAP SAFETY 5IN WIDE (MISCELLANEOUS) ×2 IMPLANT
SUT ETHIBOND 0 MO6 C/R (SUTURE) ×2 IMPLANT
SUT FIBERWIRE #2 38 BLUE 1/2 (SUTURE) ×12
SUT VIC AB 0 CT1 36 (SUTURE) ×4 IMPLANT
SUT VIC AB 2-0 CT1 27 (SUTURE) ×4
SUT VIC AB 2-0 CT1 TAPERPNT 27 (SUTURE) ×2 IMPLANT
SUTURE FIBERWR #2 38 BLUE 1/2 (SUTURE) ×6 IMPLANT
SYR 10ML LL (SYRINGE) ×2 IMPLANT
SYR 30ML LL (SYRINGE) ×4 IMPLANT
SYR TOOMEY IRRIG 70ML (MISCELLANEOUS) ×2
SYRINGE TOOMEY IRRIG 70ML (MISCELLANEOUS) ×1 IMPLANT
SYS SHOULDER NANO STEMLESS 34 (Shoulder) ×2 IMPLANT
SYSTEM SHLDER NANO STEMLESS 34 (Shoulder) ×1 IMPLANT
TAPE TRANSPORE STRL 2 31045 (GAUZE/BANDAGES/DRESSINGS) ×2 IMPLANT
TIP FAN IRRIG PULSAVAC PLUS (DISPOSABLE) ×1 IMPLANT
WRAPON POLAR PAD SHDR UNIV (MISCELLANEOUS) ×2

## 2020-02-18 NOTE — Anesthesia Postprocedure Evaluation (Signed)
Anesthesia Post Note  Patient: Martha Whitney  Procedure(s) Performed: TOTAL SHOULDER ARTHROPLASTY (Right Shoulder)  Patient location during evaluation: PACU Anesthesia Type: Regional and General Level of consciousness: awake Pain management: pain level controlled Vital Signs Assessment: post-procedure vital signs reviewed and stable Respiratory status: spontaneous breathing Cardiovascular status: stable Postop Assessment: no apparent nausea or vomiting Anesthetic complications: no   No complications documented.   Last Vitals:  Vitals:   02/18/20 1124 02/18/20 1301  BP: 117/60 (!) 102/58  Pulse: 68 77  Resp: 16 16  Temp: (!) 36.1 C (!) 36.1 C  SpO2: 95% 100%    Last Pain:  Vitals:   02/18/20 1301  TempSrc: Temporal  PainSc: 0-No pain                 Neva Seat

## 2020-02-18 NOTE — H&P (Signed)
History of Present Illness: Martha Whitney is a 55 y.o. female who presents today for her surgical history and physical for upcoming right total shoulder arthroplasty. Surgery scheduled with Dr. Roland Rack on 02/18/2020. Overall the patient feels that she is doing well, pain score today is a 6 out of 10. She denies any changes in her medical history since she was last evaluated. Denies any falls or trauma affecting the right shoulder since her last evaluation. The patient denies any personal history of heart attack, stroke, asthma or COPD. No personal history of blood clots.  Past Medical History: . Allergic rhinitis  . Anxiety  . GERD (gastroesophageal reflux disease) 2021  . History of chicken pox  . Hypercholesterolemia 06/14/2013  . Hyperglycemia 06/14/2013  . Inflammatory arthritis - Question, seronegative. [facility])  . Kidney stone  . RLS (restless legs syndrome) 06/14/2013  . Type 2 diabetes mellitus, without long-term current use of insulin (CMS-HCC)   Past Surgical History: . CHOLECYSTECTOMY 2005  . COLONOSCOPY 10/26/2005  Int Hemorrhoids: CBF 10/2015; Recall Ltr mailed 08/16/2015 (dw); See msg 08/23/2015 to cb when ready to schedule (dw)  . EGD 10/26/2005  . ENDOSCOPIC CARPAL TUNNEL RELEASE Left 2003  . EXTENSIVE ARTHROSCOPIC DEBRIDEMENT ARTHROSCOPIC SUBACROMIAL DECOMPRESSION MINI-OPEN ROTATOR CUFF REPAIR USING THE SMITH & NEPHEW [facility] AND MINI-OPEN BICEPS TENODESIS RIGHT SHOULDER Right 07/09/2017 (DR.Cushing)  . HYSTERECTOMY 12/06/05  . OOPHORECTOMY Bilateral 06/2014  . Removal ganglion of wrist Left 10/21/2014 (Dr.Menz)  . TONSILLECTOMY 2002  . TOOTH EXTRACTION   Past Family History: . Coronary Artery Disease (Blocked arteries around heart) Father  . Stroke Father  . Myocardial Infarction (Heart attack) Father  . Diabetes type II Mother  . Colon polyps Mother   Medications: . acetaminophen (TYLENOL) 500 MG tablet Take 1,000 mg by mouth as needed for Pain  . gabapentin  (NEURONTIN) 400 MG capsule Take 1 capsule (400 mg total) by mouth 2 (two) times daily as needed 180 capsule 3  . pantoprazole (PROTONIX) 40 MG DR tablet Take 1 tablet (40 mg total) by mouth once daily 30 tablet 11  . pravastatin (PRAVACHOL) 20 MG tablet Take 1 tablet (20 mg total) by mouth nightly 90 tablet 11  . semaglutide 7 mg Tab Take 7 mg by mouth once daily Do not cut, crush, or chew 90 tablet 3  . traZODone (DESYREL) 50 MG tablet TABKE 1 TO 4 TABLET BY MOUTH EVRY NIGHT AS NEEDED FOR SLEEP 360 tablet 1  . azelastine (ASTELIN) 137 mcg nasal spray Place 1 spray into both nostrils 2 (two) times daily (Patient not taking: Reported on 02/15/2020 ) 10 mL 1  . ibuprofen (MOTRIN) 400 MG tablet Take 400 mg by mouth as needed for Pain (Patient not taking: Reported on 02/15/2020 )  . meloxicam (MOBIC) 15 MG tablet Take 1 tablet (15 mg total) by mouth once daily (Patient not taking: Reported on 02/15/2020 ) 30 tablet 0  . semaglutide 3 mg Tab Take 3 mg by mouth once daily Do not cut, crush, or chew (Patient not taking: Reported on 02/15/2020 ) 30 tablet 0  . triamcinolone 0.1 % cream Apply topically 2 (two) times daily (Patient not taking: Reported on 02/15/2020 ) 30 g 0   Allergies: . Pneumococcal Vaccine Hives  . Barbital Hives  . Influenza Virus Vaccine Tvs 2012-13 (18+) Cell Der Hives  . SURGICAL GLUE-REDNESS AND ITCHING  . Sulfa (Sulfonamide Antibiotics) Unknown  . Adhesive Tape-Silicones Rash   Review of Systems:  A comprehensive 14 point  ROS was performed, reviewed by me today, and the pertinent orthopaedic findings are documented in the HPI.  Physical Exam: BP 128/82  Ht 160 cm (5\' 3" )  Wt 77.1 kg (170 lb)  LMP (LMP Unknown)  BMI 30.11 kg/m  General/Constitutional: The patient appears to be well-nourished, well-developed, and in no acute distress. Neuro/Psych: Normal mood and affect, oriented to person, place and time. Eyes: Non-icteric. Pupils are equal, round, and reactive to light, and  exhibit synchronous movement. ENT: Unremarkable. Lymphatic: No palpable adenopathy. Respiratory: Lungs clear to auscultation, Normal chest excursion, No wheezes and Non-labored breathing Cardiovascular: Regular rate and rhythm. No murmurs. and No edema, swelling or tenderness, except as noted in detailed exam. Integumentary: No impressive skin lesions present, except as noted in detailed exam. Musculoskeletal: Unremarkable, except as noted in detailed exam.  Right shoulder exam: SKIN: Well-healed arthroscopic portal sites and surgical incision, otherwise unremarkable. SWELLING: none WARMTH: none LYMPH NODES: no adenopathy palpable CREPITUS: Mild intermittent glenohumeral crepitance TENDERNESS: Mildly tender over anterolateral shoulder ROM (active):  Forward flexion: 120 degrees Abduction: 110 degrees Internal rotation: L2 ROM (passive):  Forward flexion: 145 degrees Abduction: 140 degrees  ER/IR at 90 abd: 80 degrees / 50 degrees  She has moderate pain at the extremes of all motions.  STRENGTH: Forward flexion: 4/5 Abduction: 4/5 External rotation: 4-4+/5 Internal rotation: 4-4+/5 Pain with RC testing: Mild pain with resisted forward flexion and abduction  STABILITY: Normal  SPECIAL TESTS: Luan Pulling' test: positive, mild Speed's test: negative Capsulitis - pain w/ passive ER: no Crossed arm test: Mildly positive Crank: Not evaluated Anterior apprehension: Negative Posterior apprehension: Not evaluated  She is neurovascularly intact to the right upper extremity.  Imaging: MRI OF THE RIGHT SHOULDER WITHOUT CONTRAST:  1. Mild to moderate rotator cuff tendinopathy/tendinosis with small  interstitial tears. No partial or full-thickness rotator cuff tear.  2. Difficult to identify the long head biceps tendon. Suspect prior  tenodesis.  3. Advanced glenohumeral joint degenerative changes.  4. Suspect prior subacromial decompressive surgery with  acromioplasty. No  findings for bony impingement.   Impression: Primary osteoarthritis, right shoulder. Rotator cuff tendinitis, right.  Plan:  1. Treatment options were discussed today with the patient. 2. The patient is scheduled for a right total shoulder arthroplasty with Dr. Roland Rack on 02/18/2020. 3. The patient was instructed on the risk and benefits of surgery and wishes to proceed at this time. 4. The patient was offered and received a slingshot shoulder sling to wear following surgery. 5. This document will serve as a surgical history and physical for the patient. 6. The patient will follow-up per standard postop protocol. They can call the clinic they have any questions, new symptoms develop or symptoms worsen.  The procedure was discussed with the patient, as were the potential risks (including bleeding, infection, nerve and/or blood vessel injury, persistent or recurrent pain, failure of the hardware, future rotator cuff tear, dislocation, need for further surgery, blood clots, strokes, heart attacks and/or arhythmias, pneumonia, etc.) and benefits. The patient states her understanding and wishes to proceed.   H&P reviewed and patient re-examined. No changes.

## 2020-02-18 NOTE — Anesthesia Procedure Notes (Signed)
Anesthesia Regional Block: Interscalene brachial plexus block   Pre-Anesthetic Checklist: ,, timeout performed, Correct Patient, Correct Site, Correct Laterality, Correct Procedure, Correct Position, site marked, Risks and benefits discussed,  Surgical consent,  Pre-op evaluation,  At surgeon's request and post-op pain management  Laterality: Right  Prep: chloraprep       Needles:  Injection technique: Single-shot  Needle Type: Stimiplex     Needle Length: 5cm  Needle Gauge: 22     Additional Needles:   Procedures:,,,, ultrasound used (permanent image in chart),,,,  Narrative:  Start time: 02/18/2020 7:16 AM End time: 02/18/2020 7:18 AM Injection made incrementally with aspirations every 5 mL.  Performed by: Personally   Additional Notes: Functioning IV was confirmed and monitors were applied.  A 49mm 22ga Stimuplex needle was used. Sterile prep and drape,hand hygiene and sterile gloves were used.  Negative aspiration and negative test dose prior to incremental administration of local anesthetic. The patient tolerated the procedure well.  Local: 0.5% Bupivacaine 49ml Exparel 45ml

## 2020-02-18 NOTE — Discharge Instructions (Addendum)
o. Orthopedic discharge instructions: May shower with intact OpSite dressing.  Apply ice frequently to shoulder or use Polar Care device. Take ibuprofen 600-800 mg TID with meals for 7-10 days, then as necessary. Take oxycodone as prescribed when needed.  May supplement with ES Tylenol if necessary. Keep shoulder immobilizer on at all times except may remove for bathing purposes. Follow-up in 10-14 days or as scheduled.  AMBULATORY SURGERY  DISCHARGE INSTRUCTIONS   1) The drugs that you were given will stay in your system until tomorrow so for the next 24 hours you should not:  A) Drive an automobile B) Make any legal decisions C) Drink any alcoholic beverage   2) You may resume regular meals tomorrow.  Today it is better to start with liquids and gradually work up to solid foods.  You may eat anything you prefer, but it is better to start with liquids, then soup and crackers, and gradually work up to solid foods.   3) Please notify your doctor immediately if you have any unusual bleeding, trouble breathing, redness and pain at the surgery site, drainage, fever, or pain not relieved by medication.    4) Additional Instructions:   Please contact your physician with any problems or Same Day Surgery at [phone], Monday through Friday 6 am to 4 pm, or [place] at [facility] number at [phone].     Interscalene Nerve Block with Exparel  1.  For your surgery you have received an Interscalene Nerve Block with Exparel. 2. Nerve Blocks affect many types of nerves, including nerves that control movement, pain and normal sensation.  You may experience feelings such as numbness, tingling, heaviness, weakness or the inability to move your arm or the feeling or sensation that your arm has "fallen asleep". 3. A nerve block with Exparel can last up to 5 days.  Usually the weakness wears off first.  The tingling and heaviness usually wear off next.  Finally you may start to notice  pain.  Keep in mind that this may occur in any order.  Once a nerve block starts to wear off it is usually completely gone within 60 minutes. 4. ISNB may cause mild shortness of breath, a hoarse voice, blurry vision, unequal pupils, or drooping of the face on the same side as the nerve block.  These symptoms will usually resolve with the numbness.  Very rarely the procedure itself can cause mild seizures. 5. If needed, your surgeon will give you a prescription for pain medication.  It will take about 60 minutes for the oral pain medication to become fully effective.  So, it is recommended that you start taking this medication before the nerve block first begins to wear off, or when you first begin to feel discomfort. 6. Take your pain medication only as prescribed.  Pain medication can cause sedation and decrease your breathing if you take more than you need for the level of pain that you have. 7. Nausea is a common side effect of many pain medications.  You may want to eat something before taking your pain medicine to prevent nausea. 8. After an Interscalene nerve block, you cannot feel pain, pressure or extremes in temperature in the effected arm.  Because your arm is numb it is at an increased risk for injury.  To decrease the possibility of injury, please practice the following:  a. While you are awake change the position of your arm frequently to prevent too much pressure on any one area for prolonged  periods of time. b.  If you have a cast or tight dressing, check the color or your fingers every couple of hours.  Call your surgeon with the appearance of any discoloration (white or blue). c. If you are given a sling to wear before you go home, please wear it  at all times until the block has completely worn off.  Do not get up at night without your sling. d. Please contact [facility] or your surgeon if you do not begin to regain sensation after 7 days from the surgery.  Anesthesia may be  contacted by calling the Same Day Surgery Department, Mon. through Fri., 6 am to 4 pm at [phone].   e. If you experience any other problems or concerns, please contact your surgeon's office. f. If you experience severe or prolonged shortness of breath go to the nearest emergency department.

## 2020-02-18 NOTE — Anesthesia Preprocedure Evaluation (Signed)
Anesthesia Evaluation  Patient identified by MRN, date of birth, ID band Patient awake    Reviewed: Allergy & Precautions, H&P , NPO status , Patient's Chart, lab work & pertinent test results  History of Anesthesia Complications (+) DIFFICULT AIRWAY and history of anesthetic complications  Airway Mallampati: III       Dental  (+) Poor Dentition, Chipped   Pulmonary pneumonia,    Pulmonary exam normal breath sounds clear to auscultation       Cardiovascular negative cardio ROS Normal cardiovascular exam Rhythm:Regular Rate:Normal     Neuro/Psych negative neurological ROS  negative psych ROS   GI/Hepatic Neg liver ROS, GERD  ,  Endo/Other  negative endocrine ROSdiabetes  Renal/GU negative Renal ROS  negative genitourinary   Musculoskeletal  (+) Arthritis ,   Abdominal   Peds negative pediatric ROS (+)  Hematology negative hematology ROS (+)   Anesthesia Other Findings Past Medical History: No date: Arthritis No date: Cancer (HCC)     Comment:  skin ca on face/CERVICAL EARYLY 20'S No date: Diabetes mellitus without complication (HCC) No date: Difficult intubation No date: Endometriosis determined by laparoscopy No date: GERD (gastroesophageal reflux disease) No date: History of kidney stones     Comment:  H/O 2007: Restless leg syndrome   Reproductive/Obstetrics negative OB ROS                             Anesthesia Physical Anesthesia Plan  ASA: III  Anesthesia Plan: General and Regional   Post-op Pain Management: GA combined w/ Regional for post-op pain   Induction:   PONV Risk Score and Plan: 3 and Ondansetron, Dexamethasone and Treatment may vary due to age or medical condition  Airway Management Planned: Oral ETT  Additional Equipment:   Intra-op Plan:   Post-operative Plan: Extubation in OR  Informed Consent: I have reviewed the patients History and Physical,  chart, labs and discussed the procedure including the risks, benefits and alternatives for the proposed anesthesia with the patient or authorized representative who has indicated his/her understanding and acceptance.     Dental advisory given  Plan Discussed with: CRNA, Anesthesiologist and Surgeon  Anesthesia Plan Comments:         Anesthesia Quick Evaluation

## 2020-02-18 NOTE — Transfer of Care (Signed)
Immediate Anesthesia Transfer of Care Note  Patient: Martha Whitney  Procedure(s) Performed: TOTAL SHOULDER ARTHROPLASTY (Right Shoulder)  Patient Location: PACU  Anesthesia Type:General  Level of Consciousness: awake  Airway & Oxygen Therapy: Patient Spontanous Breathing  Post-op Assessment: Report given to RN  Post vital signs: Reviewed and stable  Last Vitals:  Vitals Value Taken Time  BP 115/62 02/18/20 1028  Temp    Pulse 87 02/18/20 1033  Resp 20 02/18/20 1033  SpO2 98 % 02/18/20 1033  Vitals shown include unvalidated device data.  Last Pain:  Vitals:   02/18/20 0715  TempSrc:   PainSc: 0-No pain      Patients Stated Pain Goal: 0 (77/11/65 7903)  Complications: No complications documented.

## 2020-02-18 NOTE — Anesthesia Procedure Notes (Signed)
Procedure Name: Intubation Date/Time: 02/18/2020 7:44 AM Performed by: Louann Sjogren, CRNA Pre-anesthesia Checklist: Patient identified, Patient being monitored, Timeout performed, Emergency Drugs available and Suction available Patient Re-evaluated:Patient Re-evaluated prior to induction Oxygen Delivery Method: Circle system utilized Preoxygenation: Pre-oxygenation with 100% oxygen Induction Type: IV induction Ventilation: Mask ventilation without difficulty Laryngoscope Size: McGraph and 4 Grade View: Grade II Tube type: Oral Tube size: 7.0 mm Number of attempts: 1 Airway Equipment and Method: Stylet Placement Confirmation: ETT inserted through vocal cords under direct vision,  positive ETCO2 and breath sounds checked- equal and bilateral Secured at: 21 cm Tube secured with: Tape Dental Injury: Teeth and Oropharynx as per pre-operative assessment

## 2020-02-18 NOTE — Op Note (Signed)
02/18/2020  10:09 AM  Patient:   Martha Whitney  Pre-Op Diagnosis:   Advanced degenerative joint disease, right shoulder.  Post-Op Diagnosis:   Same  Procedure:   Right total shoulder arthroplasty.  Surgeon:   Pascal Lux, MD  Assistant:   Cameron Proud, PA-C; Jaynie Bream, PA-S  Anesthesia:   General endotracheal intubation with an interscalene block using Exparel.  Findings:   As above. The rotator cuff was in satisfactory condition.  Complications:   None  EBL:   75 cc  Fluids:   700 cc crystalloid  UOP:   None  TT:   None  Drains:   None  Closure:   Staples  Implants:   Biomet Comprehensive system with a 34 mm humeral Nanostem, an 18 x 42 mm humeral head, and a small cemented glenoid component with a Regenerex post.  Brief Clinical Note:   The patient is a 55 year old female with a history of progressively worsening right shoulder pain. She is now over 2.5 years status post a right shoulder arthroscopy with debridement decompression and mini open rotator cuff repair, from which she did well for nearly 2 years. Over the past 4 to 6 months, the patient has noted progressively worsening right shoulder pain. The symptoms have progressed despite medications, activity modification, etc. Her history and examination consistent with progressive degenerative joint disease of the right shoulder as confirmed by plain radiographs. Her preoperative MRI scan confirmed the presence of advanced degenerative changes with overall satisfactory condition of the rotator cuff. The patient presents at this time for a left total shoulder arthroplasty.  Procedure:   The patient underwent placement of an interscalene block using Exparel in the preoperative holding area by the anesthesiologist before being brought into the operating room and lain in the supine position. After satisfactory general endotracheal intubation and anesthesia was achieved, the patient was repositioned in the beach  chair position using the beach chair positioner. The right shoulder and upper extremity were prepped with ChloraPrep solution before being draped sterilely. Preoperative antibiotics were administered.   A standard anterior approach to the shoulder was made through an approximately 4-5 inch incision. The incision was carried down through the subcutaneous tissues to expose the deltopectoral fascia. The interval between the deltoid and pectoralis muscles was identified and this plane developed, retracting the cephalic vein laterally with the deltoid muscle. The conjoined tendon was identified. The lateral margin was dissected and the Kolbel self-retraining retractor inserted. The "three sisters" were identified and cauterized. Bursal tissues were removed to improve visualization.  The biceps tendon was identified in the bicipital groove, and a soft tissue biceps tenodesis performed using two #0 Ethibond interrupted sutures, tacking the distal portion of the biceps tendon to the adjacent pectoralis major tendon tissues.  The subscapularis tendon was released from its attachment to the lesser tuberosity 1 cm proximal to its insertion and several tagging sutures placed. The inferior capsule was released with care after identifying and protecting the axillary nerve.   The humeral head was dislocated and, using the appropriate guide, a guidewire was placed along the articular surface at the appropriate version near the supraspinatus footprint laterally. Using the appropriate guide, three additional guide pins were introduced in parallel fashion before this platform was used as a cutting guide to remove the humeral head.  This piece was taken to the back table where it was found to be optimally replicated by an 18 x 42 mm humeral head.  Attention was directed to the glenoid.  The labrum was debrided circumferentially before the center of the glenoid was marked with electrocautery. The small and medium sizers were  positioned and it was elected to proceed with a small glenoid component. The guidewire was drilled into the glenoid neck using the appropriate guide. After verifying its position, the glenoid was lightly reamed with the butterfly reamer before the centralizing Regenerex post reamer was used. The small peripheral peg guide was positioned and each of the three pegs drilled sequentially, leaving the preceding drill bit in place so as to minimize shifting of the guide. The bony surfaces were prepared for cementing by irrigating them thoroughly with bacitracin saline solution using the jet lavage system, then packing the glenoid with a Neo-Synephrine soaked sponge. Meanwhile, cement was mixed on the back table. When it was ready, some cement was injected into each of the three peg holes using a Toomey syringe and additional cement applied to the posterior aspect of the glenoid component. The component was impacted into place and the excess cement was removed using a Surveyor, quantity. Pressure was maintained on the glenoid until the cement hardened.  Attention was re-directed to the humeral side. The proximal humeral surface was sized to ensure central position of the guidepin. This guidepin was left in place and the proximal humerus prepared using the appropriate reamer and punch before the medium sized Sidus humeral anchor was impacted into place. Several trial reductions were performed using multiple head sizes before settling on the 18 x 42 mm trial humeral head. After reducing the shoulder, the arm demonstrated excellent range of motion as the hand could be brought across the chest to the opposite shoulder and brought to the top of the patient's head and to the patient's ear. The shoulder remained stable throughout this range of motion, and was stable with abduction and external rotation. The joint was dislocated and the trial component was removed. The permanent 18 x 42 mm humeral head was impacted into place.  Again, the [facility] taper locking mechanism was verified using manual distraction. The shoulder was relocated and again placed through a range of motion with the findings as described above.  The wound was copiously irrigated with sterile saline solution using the jet lavage system before a total of 20 cc of Exparel diluted out to 60 cc with normal saline and 30 cc of 0.5% Sensorcaine with epinephrine was injected into the pericapsular and peri-incisional tissues to help with postoperative analgesia. The subscapularis tendon was reapproximated using #2 FiberWire interrupted sutures. Several additional #2 FiberWire interrupted sutures were used to close the rotator interval to help correct some mild posterior laxity. The deltopectoral interval was closed using #0 Vicryl interrupted sutures before the subcutaneous tissues were closed using 2-0 Vicryl interrupted sutures. The skin was closed using staples. Prior to closing the skin, 1 g of transexemic acid in 10 cc of normal saline was injected intra-articularly to help with postoperative bleeding. A sterile occlusive dressing was applied to the wound before the arm was placed into a shoulder immobilizer with an abduction pillow. A polar care device also was applied to the shoulder. The patient was then transferred back to a hospital bed before being awakened, extubated, and returned to the recovery room in satisfactory condition after tolerating the procedure well.

## 2020-02-19 ENCOUNTER — Encounter: Payer: Self-pay | Admitting: Surgery

## 2020-02-19 LAB — SURGICAL PATHOLOGY

## 2020-06-22 ENCOUNTER — Other Ambulatory Visit: Payer: Self-pay

## 2020-06-22 ENCOUNTER — Emergency Department: Payer: BC Managed Care – PPO

## 2020-06-22 ENCOUNTER — Emergency Department
Admission: EM | Admit: 2020-06-22 | Discharge: 2020-06-23 | Disposition: A | Discharge status: Home / Self Care | Payer: BC Managed Care – PPO | Attending: Emergency Medicine | Admitting: Emergency Medicine

## 2020-06-22 DIAGNOSIS — R109 Unspecified abdominal pain: Secondary | ICD-10-CM

## 2020-06-22 DIAGNOSIS — E119 Type 2 diabetes mellitus without complications: Secondary | ICD-10-CM | POA: Insufficient documentation

## 2020-06-22 DIAGNOSIS — Z96611 Presence of right artificial shoulder joint: Secondary | ICD-10-CM | POA: Insufficient documentation

## 2020-06-22 DIAGNOSIS — N12 Tubulo-interstitial nephritis, not specified as acute or chronic: Secondary | ICD-10-CM | POA: Diagnosis not present

## 2020-06-22 DIAGNOSIS — Z85828 Personal history of other malignant neoplasm of skin: Secondary | ICD-10-CM | POA: Diagnosis not present

## 2020-06-22 LAB — CBC
HCT: 38.3 % (ref 36.0–46.0)
Hemoglobin: 13 g/dL (ref 12.0–15.0)
MCH: 29 pg (ref 26.0–34.0)
MCHC: 33.9 g/dL (ref 30.0–36.0)
MCV: 85.5 fL (ref 80.0–100.0)
Platelets: 262 10*3/uL (ref 150–400)
RBC: 4.48 MIL/uL (ref 3.87–5.11)
RDW: 12.9 % (ref 11.5–15.5)
WBC: 11.7 10*3/uL — ABNORMAL HIGH (ref 4.0–10.5)
nRBC: 0 % (ref 0.0–0.2)

## 2020-06-22 LAB — URINALYSIS, COMPLETE (UACMP) WITH MICROSCOPIC
Bacteria, UA: NONE SEEN
RBC / HPF: NONE SEEN RBC/hpf (ref 0–5)
Specific Gravity, Urine: 1.027 (ref 1.005–1.030)
WBC, UA: >50 WBC/hpf (ref 0–5)

## 2020-06-22 LAB — BASIC METABOLIC PANEL
Anion gap: 6 (ref 5–15)
BUN: 14 mg/dL (ref 6–20)
CO2: 29 mmol/L (ref 22–32)
Calcium: 9.1 mg/dL (ref 8.9–10.3)
Chloride: 103 mmol/L (ref 98–111)
Creatinine, Ser: 0.92 mg/dL (ref 0.44–1.00)
GFR, Estimated: >60 mL/min (ref >60)
Glucose, Bld: 141 mg/dL — ABNORMAL HIGH (ref 70–99)
Potassium: 3.9 mmol/L (ref 3.5–5.1)
Sodium: 138 mmol/L (ref 135–145)

## 2020-06-22 MED ORDER — SODIUM CHLORIDE 0.9 % IV BOLUS
1000.0000 mL | Freq: Once | INTRAVENOUS | Status: AC
  Administered 2020-06-22: 1000 mL via INTRAVENOUS

## 2020-06-22 MED ORDER — KETOROLAC TROMETHAMINE 30 MG/ML IJ SOLN
30.0000 mg | Freq: Once | INTRAMUSCULAR | Status: AC
  Administered 2020-06-22: 30 mg via INTRAVENOUS
  Filled 2020-06-22: qty 1

## 2020-06-22 MED ORDER — SODIUM CHLORIDE 0.9 % IV SOLN
1.0000 g | Freq: Once | INTRAVENOUS | Status: AC
  Administered 2020-06-22: 1 g via INTRAVENOUS
  Filled 2020-06-22: qty 10

## 2020-06-22 NOTE — ED Triage Notes (Signed)
Pt to ED for right flank pain for a few days with hematuria and burning with urination. +nausea. Denies urinary retention  Hx kidney stones

## 2020-06-22 NOTE — ED Provider Notes (Signed)
[facility] Emergency Department Provider Note ____________________________________________   Event Date/Time   First MD Initiated Contact with Patient 06/22/20 2301     (approximate)  I have reviewed the triage vital signs and the nursing notes.   HISTORY  Chief Complaint Flank Pain    HPI Martha Whitney is a 55 y.o. female with PMH as noted below including DM and kidney stones who presents with right flank pain over the last few days, gradual onset, worsened today, nonradiating, and associated with hematuria.  The patient states that her urine has been bloody and she has had associated dysuria and frequency.  She denies fever or chills, vomiting or diarrhea, or abdominal pain.  She states the pain and other symptoms feel similar to prior kidney stones.   Past Medical History:  Diagnosis Date   Arthritis    Cancer ([place])    skin ca on face/CERVICAL EARYLY 20'S   Diabetes mellitus without complication ([place])    Difficult intubation    Endometriosis determined by laparoscopy    GERD (gastroesophageal reflux disease)    History of kidney stones    H/O   Restless leg syndrome 2007    Patient Active Problem List   Diagnosis Date Noted   CAP (community acquired pneumonia) 05/23/2015    Past Surgical History:  Procedure Laterality Date   ABDOMINAL HYSTERECTOMY     CARPAL TUNNEL RELEASE Left 2003   CHOLECYSTECTOMY     CYSTECTOMY N/A 1969   cyst removed from throat   GANGLION CYST EXCISION Left 10/21/2014   Procedure: REMOVAL GANGLION OF WRIST;  Surgeon: Hessie Knows, MD;  Location: [facility] ORS;  Service: Orthopedics;  Laterality: Left;   LAPAROSCOPIC SALPINGO OOPHERECTOMY Bilateral 06/22/2014   Procedure: LAPAROSCOPIC SALPINGO OOPHORECTOMY;  Surgeon: Aletha Halim, MD;  Location: [facility] ORS;  Service: Gynecology;  Laterality: Bilateral;   LAPAROSCOPIC TOTAL HYSTERECTOMY     LEEP N/A 1995   SHOULDER ARTHROSCOPY WITH OPEN ROTATOR CUFF REPAIR Right  07/09/2017   Procedure: SHOULDER ARTHROSCOPY WITH OPEN ROTATOR CUFF REPAIR;  Surgeon: Corky Mull, MD;  Location: [facility] ORS;  Service: Orthopedics;  Laterality: Right;  extensive debridement, decompression, miniopen biceps tenodesis, mini open rotator cuff repair   TONSILLECTOMY     TOTAL SHOULDER ARTHROPLASTY Right 02/18/2020   Procedure: TOTAL SHOULDER ARTHROPLASTY;  Surgeon: Corky Mull, MD;  Location: [facility] ORS;  Service: Orthopedics;  Laterality: Right;    Prior to Admission medications   Medication Sig Start Date End Date Taking? Authorizing Provider  cefdinir (OMNICEF) 300 MG capsule Take 1 capsule (300 mg total) by mouth 2 (two) times daily for 10 days. 06/23/20 07/03/20 Yes Arta Silence, MD  ibuprofen (ADVIL) 600 MG tablet Take 1 tablet (600 mg total) by mouth every 6 (six) hours as needed. 06/23/20  Yes Arta Silence, MD  oxyCODONE-acetaminophen (PERCOCET) 5-325 MG tablet Take 1 tablet by mouth every 4 (four) hours as needed for severe pain. 06/23/20 06/23/21 Yes Arta Silence, MD  acetaminophen (TYLENOL) 500 MG tablet Take 1,000 mg by mouth every 8 (eight) hours as needed for moderate pain or headache.    [provider]  gabapentin (NEURONTIN) 400 MG capsule Take 400 mg by mouth 2 (two) times daily as needed (for restless legs).    [provider]  oxyCODONE (ROXICODONE) 5 MG immediate release tablet Take 1-2 tablets (5-10 mg total) by mouth every 4 (four) hours as needed for moderate pain or severe pain. 02/18/20   Poggi, Marshall Cork, MD  pantoprazole (PROTONIX) 40 MG tablet Take 40 mg by mouth every morning.    [provider]  pravastatin (PRAVACHOL) 20 MG tablet Take 20 mg by mouth every evening.    [provider]  Semaglutide (RYBELSUS) 7 MG TABS Take 7 mg by mouth every morning.    [provider]  traZODone (DESYREL) 50 MG tablet Take 50-200 mg by mouth at bedtime.    [provider]    Allergies Barbital, Influenza  virus vaccine, Sulfa antibiotics, Other, Pneumococcal vaccines, and Tape  Family History  Problem Relation Age of Onset   Heart disease Father    Heart disease Maternal Grandmother    Heart disease Paternal Grandmother    Breast cancer Neg Hx     Social History Social History   Tobacco Use   Smoking status: Never   Smokeless tobacco: Never  Vaping Use   Vaping Use: Never used  Substance Use Topics   Alcohol use: Not Currently   Drug use: No    Review of Systems  Constitutional: No fever/chills Eyes: No visual changes. ENT: No sore throat. Cardiovascular: Denies chest pain. Respiratory: Denies shortness of breath. Gastrointestinal: No vomiting or diarrhea.  Genitourinary: Positive for dysuria and hematuria. Musculoskeletal: Positive for back pain. Skin: Negative for rash. Neurological: Negative for headaches, focal weakness or numbness.   ____________________________________________   PHYSICAL EXAM:  VITAL SIGNS: ED Triage Vitals  Enc Vitals Group     BP 06/22/20 1808 133/81     Pulse Rate 06/22/20 1808 88     Resp 06/22/20 1808 16     Temp 06/22/20 1809 98.6 F (37 C)     Temp Source 06/22/20 1809 Oral     SpO2 06/22/20 1808 96 %     Weight 06/22/20 1808 165 lb (74.8 kg)     Height 06/22/20 1808 5\' 3"  (1.6 m)     Head Circumference --      Peak Flow --      Pain Score 06/22/20 1808 6     Pain Loc --      Pain Edu? --      Excl. in [place]? --     Constitutional: Alert and oriented.  Slightly uncomfortable appearing but in no acute distress. Eyes: Conjunctivae are normal.  Head: Atraumatic. Nose: No congestion/rhinnorhea. Mouth/Throat: Mucous membranes are moist.   Neck: Normal range of motion.  Cardiovascular: Normal rate, regular rhythm. Good peripheral circulation. Respiratory: Normal respiratory effort.  No retractions.  Gastrointestinal: Soft and nontender. No distention.  Genitourinary: Mild right CVA tenderness. Musculoskeletal: Extremities  warm and well perfused.  Neurologic:  Normal speech and language. No gross focal neurologic deficits are appreciated.  Skin:  Skin is warm and dry. No rash noted. Psychiatric: Mood and affect are normal. Speech and behavior are normal.  ____________________________________________   LABS (all labs ordered are listed, but only abnormal results are displayed)  Labs Reviewed  URINALYSIS, COMPLETE (UACMP) WITH MICROSCOPIC - Abnormal; Notable for the following components:      Result Value   Color, Urine RED (*)    APPearance TURBID (*)    Glucose, UA   (*)    Value: TEST NOT REPORTED DUE TO COLOR INTERFERENCE OF URINE PIGMENT   Hgb urine dipstick   (*)    Value: TEST NOT REPORTED DUE TO COLOR INTERFERENCE OF URINE PIGMENT   Bilirubin Urine   (*)    Value: TEST NOT REPORTED DUE TO COLOR INTERFERENCE OF URINE PIGMENT   Ketones,  ur   (*)    Value: TEST NOT REPORTED DUE TO COLOR INTERFERENCE OF URINE PIGMENT   Protein, ur   (*)    Value: TEST NOT REPORTED DUE TO COLOR INTERFERENCE OF URINE PIGMENT   Nitrite   (*)    Value: TEST NOT REPORTED DUE TO COLOR INTERFERENCE OF URINE PIGMENT   Leukocytes,Ua   (*)    Value: TEST NOT REPORTED DUE TO COLOR INTERFERENCE OF URINE PIGMENT   All other components within normal limits  BASIC METABOLIC PANEL - Abnormal; Notable for the following components:   Glucose, Bld 141 (*)    All other components within normal limits  CBC - Abnormal; Notable for the following components:   WBC 11.7 (*)    All other components within normal limits   ____________________________________________  EKG   ____________________________________________  RADIOLOGY  CT abdomen/pelvis: Left renal pelvic stone with no hydronephrosis or other acute abnormality.  ____________________________________________   PROCEDURES  Procedure(s) performed: No  Procedures  Critical Care performed: No ____________________________________________   INITIAL IMPRESSION /  ASSESSMENT AND PLAN / ED COURSE  Pertinent labs & imaging results that were available during my care of the patient were reviewed by me and considered in my medical decision making (see chart for details).  55 year old female with PMH as noted above including diabetes and kidney stones presents with right flank pain over the last several days associated with hematuria, frequency, dysuria.  On exam, the patient is overall well-appearing.  Her vital signs are normal.  She has mild right CVA tenderness and an otherwise unremarkable exam.  The abdomen is soft and nontender.  Initial differential includes recurrent ureteral stone, pyelonephritis, musculoskeletal pain, diverticulitis, colitis.  However, the CT does not show any kidney stone or hydronephrosis on the right.  There is a left renal pelvic stone which should not be causing any acute issues.  The urinalysis shows significant WBCs and hematuria and serum white count is slightly elevated.  Overall presentation therefore favors pyelonephritis.  This is not consistent with an infected stone given that the patient's pain is on the right only, and the stone is seen in the left kidney and does not appear to be causing any obstruction.  Given the patient's overall well appearance, stable vitals, and lack of GI symptoms, she should be appropriate for outpatient treatment.  I have ordered fluids, analgesia, and and a dose of ceftriaxone.  ----------------------------------------- 1:32 AM on 06/23/2020 -----------------------------------------  The patient had minimal relief with Toradol but now has been comfortable after receiving morphine.  She feels well to go home and is stable for discharge at this time.  I counseled the patient on the results of the work-up and the plan of care.  I will prescribe a 10-day course of Omnicef and some analgesia, and have provided urology referral.  I gave her thorough return precautions and she expressed  understanding.  ____________________________________________   FINAL CLINICAL IMPRESSION(S) / ED DIAGNOSES  Final diagnoses:  Flank pain  Pyelonephritis      NEW MEDICATIONS STARTED DURING THIS VISIT:  New Prescriptions   CEFDINIR (OMNICEF) 300 MG CAPSULE    Take 1 capsule (300 mg total) by mouth 2 (two) times daily for 10 days.   IBUPROFEN (ADVIL) 600 MG TABLET    Take 1 tablet (600 mg total) by mouth every 6 (six) hours as needed.   OXYCODONE-ACETAMINOPHEN (PERCOCET) 5-325 MG TABLET    Take 1 tablet by mouth every 4 (four) hours as needed for  severe pain.     Note:  This document was prepared using Dragon voice recognition software and may include unintentional dictation errors.    Arta Silence, MD 06/23/20 [phone]

## 2020-06-22 NOTE — ED Notes (Signed)
ED Provider at bedside. 

## 2020-06-23 MED ORDER — MORPHINE SULFATE (PF) 4 MG/ML IV SOLN
4.0000 mg | Freq: Once | INTRAVENOUS | Status: AC
  Administered 2020-06-23: 4 mg via INTRAVENOUS
  Filled 2020-06-23: qty 1

## 2020-06-23 MED ORDER — CEFDINIR 300 MG PO CAPS: 300.0000 mg | ORAL_CAPSULE | Freq: Two times a day (BID) | ORAL | 0 refills | Status: AC

## 2020-06-23 MED ORDER — OXYCODONE-ACETAMINOPHEN 5-325 MG PO TABS: 1.0000 | ORAL_TABLET | ORAL | 0 refills | Status: DC | PRN

## 2020-06-23 MED ORDER — IBUPROFEN 600 MG PO TABS: 600.0000 mg | ORAL_TABLET | Freq: Four times a day (QID) | ORAL | 0 refills | Status: DC | PRN

## 2020-06-23 NOTE — ED Notes (Signed)
ED Provider at bedside. 

## 2020-06-23 NOTE — Discharge Instructions (Addendum)
Your flank pain and urinalysis suggest pyelonephritis which is a kidney infection.  Take the antibiotic as prescribed and finish the full 10-day course.  You should make an appointment with urology; referral information has been provided.  In the meantime, return to the ER for new, worsening, or persistent severe pain, vomiting, fever, inability to take the medication, or any other new or worsening symptoms that concern you.

## 2020-07-06 ENCOUNTER — Other Ambulatory Visit: Payer: Self-pay

## 2020-07-06 ENCOUNTER — Ambulatory Visit (INDEPENDENT_AMBULATORY_CARE_PROVIDER_SITE_OTHER): Payer: BC Managed Care – PPO | Admitting: Urology

## 2020-07-06 ENCOUNTER — Ambulatory Visit
Admission: RE | Admit: 2020-07-06 | Discharge: 2020-07-06 | Disposition: A | Discharge status: Home / Self Care | Payer: BC Managed Care – PPO | Attending: Urology | Admitting: Urology

## 2020-07-06 ENCOUNTER — Ambulatory Visit
Admission: RE | Admit: 2020-07-06 | Discharge: 2020-07-06 | Disposition: A | Discharge status: Home / Self Care | Payer: BC Managed Care – PPO | Source: Ambulatory Visit | Attending: Urology | Admitting: Urology

## 2020-07-06 VITALS — BP 113/77 | HR 73 | Ht 63.0 in | Wt 166.0 lb

## 2020-07-06 DIAGNOSIS — N200 Calculus of kidney: Secondary | ICD-10-CM

## 2020-07-06 LAB — URINALYSIS, COMPLETE
Bilirubin, UA: NEGATIVE
Glucose, UA: NEGATIVE
Ketones, UA: NEGATIVE
Leukocytes,UA: NEGATIVE
Nitrite, UA: NEGATIVE
Protein,UA: NEGATIVE
Specific Gravity, UA: >1.03 — ABNORMAL HIGH (ref 1.005–1.030)
Urobilinogen, Ur: 0.2 mg/dL (ref 0.2–1.0)
pH, UA: 5 (ref 5.0–7.5)

## 2020-07-06 LAB — MICROSCOPIC EXAMINATION: Bacteria, UA: NONE SEEN

## 2020-07-06 MED ORDER — OXYCODONE-ACETAMINOPHEN 5-325 MG PO TABS: 1.0000 | ORAL_TABLET | ORAL | 0 refills | Status: DC | PRN

## 2020-07-06 NOTE — Progress Notes (Signed)
07/06/2020 11:38 AM   Martha Whitney 10-May-1965 [id number]  Referring provider: Kirk Ruths, MD [facility] [place],  [place] [address]  Chief Complaint  Patient presents with   Nephrolithiasis    HPI: 55 year old female who presents today for further evaluation of a 7 mm left renal pelvic stone.  She was seen and evaluated in the emergency room on 06/22/2020 with RIGHT flank pain, gross hematuria urgency and frequency.  This had progressed over the past several previous days.  The pain was consistent with previous kidney stone episodes.  As such, she underwent a CT stone protocol which revealed an incidental LEFT centimeter renal pelvic stone without obstructive changes.  Urinalysis at that visit did infection greater than 50 WBCs per high-powered field with no bacteria.  She was pretreated for presumed UTI.  No culture was sent.  She had a mild leukocytosis to 11 but otherwise her labs are unremarkable.  Today, she reports that over the past several weeks to months, she has been having some intermittent left flank pain as well.  She attributes this to the stone.  Her right-sided pain is resolved.  No fevers or chills.  No dysuria but continues to have some intermittent bladder symptoms.  She does have a personal history of kidney stones.  She passed stone spontaneously 6 or 7 years ago but never required intervention.   PMH: Past Medical History:  Diagnosis Date   Arthritis    Cancer ([place])    skin ca on face/CERVICAL EARYLY 20'S   Diabetes mellitus without complication ([place])    Difficult intubation    Endometriosis determined by laparoscopy    GERD (gastroesophageal reflux disease)    History of kidney stones    H/O   Restless leg syndrome 2007    Surgical History: Past Surgical History:  Procedure Laterality Date   ABDOMINAL HYSTERECTOMY     CARPAL TUNNEL RELEASE Left 2003   CHOLECYSTECTOMY     CYSTECTOMY N/A 1969   cyst  removed from throat   GANGLION CYST EXCISION Left 10/21/2014   Procedure: REMOVAL GANGLION OF WRIST;  Surgeon: Hessie Knows, MD;  Location: [facility] ORS;  Service: Orthopedics;  Laterality: Left;   LAPAROSCOPIC SALPINGO OOPHERECTOMY Bilateral 06/22/2014   Procedure: LAPAROSCOPIC SALPINGO OOPHORECTOMY;  Surgeon: Aletha Halim, MD;  Location: [facility] ORS;  Service: Gynecology;  Laterality: Bilateral;   LAPAROSCOPIC TOTAL HYSTERECTOMY     LEEP N/A 1995   SHOULDER ARTHROSCOPY WITH OPEN ROTATOR CUFF REPAIR Right 07/09/2017   Procedure: SHOULDER ARTHROSCOPY WITH OPEN ROTATOR CUFF REPAIR;  Surgeon: Corky Mull, MD;  Location: [facility] ORS;  Service: Orthopedics;  Laterality: Right;  extensive debridement, decompression, miniopen biceps tenodesis, mini open rotator cuff repair   TONSILLECTOMY     TOTAL SHOULDER ARTHROPLASTY Right 02/18/2020   Procedure: TOTAL SHOULDER ARTHROPLASTY;  Surgeon: Corky Mull, MD;  Location: [facility] ORS;  Service: Orthopedics;  Laterality: Right;    Home Medications:  Allergies as of 07/06/2020       Reactions   Barbital Hives   Influenza Virus Vaccine Hives   Sulfa Antibiotics Hives   Other Itching, Other (See Comments)   Surgical Glue; Skin Irritation   Pneumococcal Vaccines Swelling   Tape Rash        Medication List        Accurate as of July 06, 2020 11:38 AM. If you have any questions, ask your nurse or doctor.  STOP taking these medications    acetaminophen 500 MG tablet Commonly known as: TYLENOL Stopped by: Hollice Espy, MD   ibuprofen 600 MG tablet Commonly known as: ADVIL Stopped by: Hollice Espy, MD   oxyCODONE 5 MG immediate release tablet Commonly known as: Roxicodone Stopped by: Hollice Espy, MD       TAKE these medications    gabapentin 400 MG capsule Commonly known as: NEURONTIN Take 400 mg by mouth 2 (two) times daily as needed (for restless legs).   oxyCODONE-acetaminophen 5-325 MG tablet Commonly known as:  Percocet Take 1-2 tablets by mouth every 4 (four) hours as needed for moderate pain or severe pain. What changed:  how much to take reasons to take this Changed by: Hollice Espy, MD   pantoprazole 40 MG tablet Commonly known as: PROTONIX Take 40 mg by mouth every morning.   pantoprazole 40 MG tablet Commonly known as: PROTONIX Take 1 tablet by mouth daily.   pravastatin 20 MG tablet Commonly known as: PRAVACHOL Take 20 mg by mouth every evening.   Rybelsus 7 MG Tabs Generic drug: Semaglutide Take 7 mg by mouth every morning.   Rybelsus 3 MG Tabs Generic drug: Semaglutide Take 3 mg by mouth daily.   traMADol 50 MG tablet Commonly known as: ULTRAM Take 50 mg by mouth every 8 (eight) hours as needed.   traZODone 50 MG tablet Commonly known as: DESYREL Take 50-200 mg by mouth at bedtime.        Allergies:  Allergies  Allergen Reactions   Barbital Hives   Influenza Virus Vaccine Hives   Sulfa Antibiotics Hives   Other Itching and Other (See Comments)    Surgical Glue; Skin Irritation   Pneumococcal Vaccines Swelling   Tape Rash    Family History: Family History  Problem Relation Age of Onset   Heart disease Father    Heart disease Maternal Grandmother    Heart disease Paternal Grandmother    Breast cancer Neg Hx     Social History:  reports that she has never smoked. She has never used smokeless tobacco. She reports previous alcohol use. She reports that she does not use drugs.   Physical Exam: BP 113/77   Pulse 73   Ht 5\' 3"  (1.6 m)   Wt 166 lb (75.3 kg)   BMI 29.41 kg/m   Constitutional:  Alert and oriented, No acute distress. HEENT:  AT, moist mucus membranes.  Trachea midline, no masses. Cardiovascular: No clubbing, cyanosis, or edema. Respiratory: Normal respiratory effort, no increased work of breathing. Skin: No rashes, bruises or suspicious lesions. Neurologic: Grossly intact, no focal deficits, moving all 4 extremities. Psychiatric:  Normal mood and affect.  Laboratory Data: Lab Results  Component Value Date   WBC 11.7 (H) 06/22/2020   HGB 13.0 06/22/2020   HCT 38.3 06/22/2020   MCV 85.5 06/22/2020   PLT 262 06/22/2020    Lab Results  Component Value Date   CREATININE 0.92 06/22/2020     Lab Results  Component Value Date   HGBA1C 5.7 05/22/2015    Urinalysis Urinalysis today with 3-10 red blood cells per high-power field otherwise unremarkable   Pertinent Imaging:  Results for orders placed during the hospital encounter of 06/22/20  CT Renal Stone Study  Narrative CLINICAL DATA:  Right-sided flank pain with hematuria  EXAM: CT ABDOMEN AND PELVIS WITHOUT CONTRAST  TECHNIQUE: Multidetector CT imaging of the abdomen and pelvis was performed following the standard protocol without IV contrast.  COMPARISON:  10/29/2017  FINDINGS: Lower chest: No acute abnormality.  Hepatobiliary: Fatty infiltration of the liver is noted. The gallbladder has been surgically removed.  Pancreas: Unremarkable. No pancreatic ductal dilatation or surrounding inflammatory changes.  Spleen: Normal in size without focal abnormality.  Adrenals/Urinary Tract: Adrenal glands are within normal limits. Right kidney and right ureter are unremarkable. Bladder is decompressed. Left kidney demonstrates a left renal pelvic stone measuring 7 mm without obstructive change.  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 The appendix is not well visualized and may have been surgically removed. No inflammatory changes are seen. Small bowel and stomach are within normal limits.  Vascular/Lymphatic: Aortic atherosclerosis. No enlarged abdominal or pelvic lymph nodes.  Reproductive: Status post hysterectomy. No adnexal masses.  Other: No abdominal wall hernia or abnormality. No abdominopelvic ascites.  Musculoskeletal: Degenerative changes of lumbar spine are noted.  IMPRESSION: 7 mm left renal pelvic stone  without obstructive change.  Fatty infiltration of the liver.  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   Electronically Signed By: Inez Catalina M.D. On: 06/22/2020 20:02  CT stone protocol imaging was personally reviewed today and agree with radiologic interpretation.  She has a reasonable stone to skin distance as well as Hounsfield units.   Assessment & Plan:    1.  Left renal pelvic stone Intermittently symptomatic 7 mm left renal pelvic stone infection  We discussed various treatment options for urolithiasis including observation with or without medical expulsive therapy, shockwave lithotripsy (SWL), ureteroscopy and laser lithotripsy with stent placement, and percutaneous nephrolithotomy.   We discussed that management is based on stone size, location, density, patient co-morbidities, and patient preference.    Stones <56mm in size have a >80% spontaneous passage rate. Data surrounding the use of tamsulosin for medical expulsive therapy is controversial, but meta analyses suggests it is most efficacious for distal stones between 5-73mm in size. Possible side effects include dizziness/lightheadedness, and retrograde ejaculation.   SWL has a lower stone free rate in a single procedure, but also a lower complication rate compared to ureteroscopy and avoids a stent and associated stent related symptoms. Possible complications include renal hematoma, steinstrasse, and need for additional treatment. We discussed the role of his increased skin to stone distance can lead to decreased efficacy with shockwave lithotripsy.   Ureteroscopy with laser lithotripsy and stent placement has a higher stone free rate than SWL in a single procedure, however increased complication rate including possible infection, ureteral injury, bleeding, and stent related morbidity. Common stent related symptoms include dysuria, urgency/frequency, and flank pain.   After an extensive discussion of the risks and  benefits of the above treatment options, the patient would like to proceed with left ESWL.  We will get a KUB today to serve as a baseline to ensure that it is visible.  She would like to defer her treatment till next Thursday as she is traveling out of town to [place].  She was prescribed 6 tablets of Percocet to take with her, advised to avoid NSAIDs or anticoagulants at least 3 days prior to the procedure.  - DG Abd 1 View; Future - CULTURE, URINE COMPREHENSIVE - Urinalysis, Complete    Hollice Espy, MD  [place] [address] [place], [place] [address] [phone]

## 2020-07-06 NOTE — H&P (View-Only) (Signed)
07/06/2020 11:38 AM   Martha Whitney 07-Oct-1965 [id number]  Referring provider: Kirk Ruths, MD [facility] [place],  [place] [address]  Chief Complaint  Patient presents with   Nephrolithiasis    HPI: 55 year old female who presents today for further evaluation of a 7 mm left renal pelvic stone.  She was seen and evaluated in the emergency room on 06/22/2020 with RIGHT flank pain, gross hematuria urgency and frequency.  This had progressed over the past several previous days.  The pain was consistent with previous kidney stone episodes.  As such, she underwent a CT stone protocol which revealed an incidental LEFT centimeter renal pelvic stone without obstructive changes.  Urinalysis at that visit did infection greater than 50 WBCs per high-powered field with no bacteria.  She was pretreated for presumed UTI.  No culture was sent.  She had a mild leukocytosis to 11 but otherwise her labs are unremarkable.  Today, she reports that over the past several weeks to months, she has been having some intermittent left flank pain as well.  She attributes this to the stone.  Her right-sided pain is resolved.  No fevers or chills.  No dysuria but continues to have some intermittent bladder symptoms.  She does have a personal history of kidney stones.  She passed stone spontaneously 6 or 7 years ago but never required intervention.   PMH: Past Medical History:  Diagnosis Date   Arthritis    Cancer ([place])    skin ca on face/CERVICAL EARYLY 20'S   Diabetes mellitus without complication ([place])    Difficult intubation    Endometriosis determined by laparoscopy    GERD (gastroesophageal reflux disease)    History of kidney stones    H/O   Restless leg syndrome 2007    Surgical History: Past Surgical History:  Procedure Laterality Date   ABDOMINAL HYSTERECTOMY     CARPAL TUNNEL RELEASE Left 2003   CHOLECYSTECTOMY     CYSTECTOMY N/A 1969   cyst  removed from throat   GANGLION CYST EXCISION Left 10/21/2014   Procedure: REMOVAL GANGLION OF WRIST;  Surgeon: Hessie Knows, MD;  Location: [facility];  Service: Orthopedics;  Laterality: Left;   LAPAROSCOPIC SALPINGO OOPHERECTOMY Bilateral 06/22/2014   Procedure: LAPAROSCOPIC SALPINGO OOPHORECTOMY;  Surgeon: Aletha Halim, MD;  Location: [facility];  Service: Gynecology;  Laterality: Bilateral;   LAPAROSCOPIC TOTAL HYSTERECTOMY     LEEP N/A 1995   SHOULDER ARTHROSCOPY WITH OPEN ROTATOR CUFF REPAIR Right 07/09/2017   Procedure: SHOULDER ARTHROSCOPY WITH OPEN ROTATOR CUFF REPAIR;  Surgeon: Corky Mull, MD;  Location: [facility];  Service: Orthopedics;  Laterality: Right;  extensive debridement, decompression, miniopen biceps tenodesis, mini open rotator cuff repair   TONSILLECTOMY     TOTAL SHOULDER ARTHROPLASTY Right 02/18/2020   Procedure: TOTAL SHOULDER ARTHROPLASTY;  Surgeon: Corky Mull, MD;  Location: [facility];  Service: Orthopedics;  Laterality: Right;    Home Medications:  Allergies as of 07/06/2020       Reactions   Barbital Hives   Influenza Virus Vaccine Hives   Sulfa Antibiotics Hives   Other Itching, Other (See Comments)   Surgical Glue; Skin Irritation   Pneumococcal Vaccines Swelling   Tape Rash        Medication List        Accurate as of July 06, 2020 11:38 AM. If you have any questions, ask your nurse or doctor.  STOP taking these medications    acetaminophen 500 MG tablet Commonly known as: TYLENOL Stopped by: Hollice Espy, MD   ibuprofen 600 MG tablet Commonly known as: ADVIL Stopped by: Hollice Espy, MD   oxyCODONE 5 MG immediate release tablet Commonly known as: Roxicodone Stopped by: Hollice Espy, MD       TAKE these medications    gabapentin 400 MG capsule Commonly known as: NEURONTIN Take 400 mg by mouth 2 (two) times daily as needed (for restless legs).   oxyCODONE-acetaminophen 5-325 MG tablet Commonly known as:  Percocet Take 1-2 tablets by mouth every 4 (four) hours as needed for moderate pain or severe pain. What changed:  how much to take reasons to take this Changed by: Hollice Espy, MD   pantoprazole 40 MG tablet Commonly known as: PROTONIX Take 40 mg by mouth every morning.   pantoprazole 40 MG tablet Commonly known as: PROTONIX Take 1 tablet by mouth daily.   pravastatin 20 MG tablet Commonly known as: PRAVACHOL Take 20 mg by mouth every evening.   Rybelsus 7 MG Tabs Generic drug: Semaglutide Take 7 mg by mouth every morning.   Rybelsus 3 MG Tabs Generic drug: Semaglutide Take 3 mg by mouth daily.   traMADol 50 MG tablet Commonly known as: ULTRAM Take 50 mg by mouth every 8 (eight) hours as needed.   traZODone 50 MG tablet Commonly known as: DESYREL Take 50-200 mg by mouth at bedtime.        Allergies:  Allergies  Allergen Reactions   Barbital Hives   Influenza Virus Vaccine Hives   Sulfa Antibiotics Hives   Other Itching and Other (See Comments)    Surgical Glue; Skin Irritation   Pneumococcal Vaccines Swelling   Tape Rash    Family History: Family History  Problem Relation Age of Onset   Heart disease Father    Heart disease Maternal Grandmother    Heart disease Paternal Grandmother    Breast cancer Neg Hx     Social History:  reports that she has never smoked. She has never used smokeless tobacco. She reports previous alcohol use. She reports that she does not use drugs.   Physical Exam: BP 113/77   Pulse 73   Ht 5\' 3"  (1.6 m)   Wt 166 lb (75.3 kg)   BMI 29.41 kg/m   Constitutional:  Alert and oriented, No acute distress. HEENT: [place] AT, moist mucus membranes.  Trachea midline, no masses. Cardiovascular: No clubbing, cyanosis, or edema. Respiratory: Normal respiratory effort, no increased work of breathing. Skin: No rashes, bruises or suspicious lesions. Neurologic: Grossly intact, no focal deficits, moving all 4 extremities. Psychiatric:  Normal mood and affect.  Laboratory Data: Lab Results  Component Value Date   WBC 11.7 (H) 06/22/2020   HGB 13.0 06/22/2020   HCT 38.3 06/22/2020   MCV 85.5 06/22/2020   PLT 262 06/22/2020    Lab Results  Component Value Date   CREATININE 0.92 06/22/2020     Lab Results  Component Value Date   HGBA1C 5.7 05/22/2015    Urinalysis Urinalysis today with 3-10 red blood cells per high-power field otherwise unremarkable   Pertinent Imaging:  Results for orders placed during the hospital encounter of 06/22/20  CT Renal Stone Study  Narrative CLINICAL DATA:  Right-sided flank pain with hematuria  EXAM: CT ABDOMEN AND PELVIS WITHOUT CONTRAST  TECHNIQUE: Multidetector CT imaging of the abdomen and pelvis was performed following the standard protocol without IV contrast.  COMPARISON:  10/29/2017  FINDINGS: Lower chest: No acute abnormality.  Hepatobiliary: Fatty infiltration of the liver is noted. The gallbladder has been surgically removed.  Pancreas: Unremarkable. No pancreatic ductal dilatation or surrounding inflammatory changes.  Spleen: Normal in size without focal abnormality.  Adrenals/Urinary Tract: Adrenal glands are within normal limits. Right kidney and right ureter are unremarkable. Bladder is decompressed. Left kidney demonstrates a left renal pelvic stone measuring 7 mm without obstructive change.  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 The appendix is not well visualized and may have been surgically removed. No inflammatory changes are seen. Small bowel and stomach are within normal limits.  Vascular/Lymphatic: Aortic atherosclerosis. No enlarged abdominal or pelvic lymph nodes.  Reproductive: Status post hysterectomy. No adnexal masses.  Other: No abdominal wall hernia or abnormality. No abdominopelvic ascites.  Musculoskeletal: Degenerative changes of lumbar spine are noted.  IMPRESSION: 7 mm left renal pelvic stone  without obstructive change.  Fatty infiltration of the liver.  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   Electronically Signed By: Inez Catalina M.D. On: 06/22/2020 20:02  CT stone protocol imaging was personally reviewed today and agree with radiologic interpretation.  She has a reasonable stone to skin distance as well as Hounsfield units.   Assessment & Plan:    1.  Left renal pelvic stone Intermittently symptomatic 7 mm left renal pelvic stone infection  We discussed various treatment options for urolithiasis including observation with or without medical expulsive therapy, shockwave lithotripsy (SWL), ureteroscopy and laser lithotripsy with stent placement, and percutaneous nephrolithotomy.   We discussed that management is based on stone size, location, density, patient co-morbidities, and patient preference.    Stones <12mm in size have a >80% spontaneous passage rate. Data surrounding the use of tamsulosin for medical expulsive therapy is controversial, but meta analyses suggests it is most efficacious for distal stones between 5-76mm in size. Possible side effects include dizziness/lightheadedness, and retrograde ejaculation.   SWL has a lower stone free rate in a single procedure, but also a lower complication rate compared to ureteroscopy and avoids a stent and associated stent related symptoms. Possible complications include renal hematoma, steinstrasse, and need for additional treatment. We discussed the role of his increased skin to stone distance can lead to decreased efficacy with shockwave lithotripsy.   Ureteroscopy with laser lithotripsy and stent placement has a higher stone free rate than SWL in a single procedure, however increased complication rate including possible infection, ureteral injury, bleeding, and stent related morbidity. Common stent related symptoms include dysuria, urgency/frequency, and flank pain.   After an extensive discussion of the risks and  benefits of the above treatment options, the patient would like to proceed with left ESWL.  We will get a KUB today to serve as a baseline to ensure that it is visible.  She would like to defer her treatment till next Thursday as she is traveling out of town to [place].  She was prescribed 6 tablets of Percocet to take with her, advised to avoid NSAIDs or anticoagulants at least 3 days prior to the procedure.  - DG Abd 1 View; Future - CULTURE, URINE COMPREHENSIVE - Urinalysis, Complete    Hollice Espy, MD  [place] [address] [place], [place] [address] [phone]

## 2020-07-09 LAB — CULTURE, URINE COMPREHENSIVE

## 2020-07-11 ENCOUNTER — Telehealth: Payer: Self-pay | Admitting: *Deleted

## 2020-07-11 MED ORDER — NITROFURANTOIN MONOHYD MACRO 100 MG PO CAPS: 100.0000 mg | ORAL_CAPSULE | Freq: Two times a day (BID) | ORAL | 0 refills | Status: DC

## 2020-07-11 NOTE — Telephone Encounter (Addendum)
Left patient a VM sent in nitrourantonin, asked to return call with any questions   ----- Message from Hollice Espy, MD sent at 07/11/2020  9:01 AM EDT ----- This patient grow a low colony count of bacteria in her urine but was otherwise asymptomatic.  As a precaution, lets treat her with nitrofurantoin 100 mg twice daily for 7 days perirocedurally.  Hollice Espy, MD

## 2020-07-13 ENCOUNTER — Other Ambulatory Visit: Payer: Self-pay | Admitting: Urology

## 2020-07-13 DIAGNOSIS — N200 Calculus of kidney: Secondary | ICD-10-CM

## 2020-07-13 MED ORDER — CEPHALEXIN 250 MG PO CAPS: 500.0000 mg | ORAL_CAPSULE | ORAL | Status: AC

## 2020-07-28 ENCOUNTER — Encounter: Payer: Self-pay | Admitting: Urology

## 2020-07-28 ENCOUNTER — Ambulatory Visit
Admission: RE | Admit: 2020-07-28 | Discharge: 2020-07-28 | Disposition: A | Discharge status: Home / Self Care | Payer: BC Managed Care – PPO | Attending: Urology | Admitting: Urology

## 2020-07-28 ENCOUNTER — Ambulatory Visit: Payer: BC Managed Care – PPO

## 2020-07-28 ENCOUNTER — Other Ambulatory Visit: Payer: Self-pay

## 2020-07-28 ENCOUNTER — Encounter
Admission: RE | Disposition: A | Discharge status: Home / Self Care | Payer: Self-pay | Source: Home / Self Care | Attending: Urology

## 2020-07-28 DIAGNOSIS — Z85828 Personal history of other malignant neoplasm of skin: Secondary | ICD-10-CM | POA: Insufficient documentation

## 2020-07-28 DIAGNOSIS — Z882 Allergy status to sulfonamides status: Secondary | ICD-10-CM | POA: Diagnosis not present

## 2020-07-28 DIAGNOSIS — N201 Calculus of ureter: Secondary | ICD-10-CM | POA: Diagnosis present

## 2020-07-28 DIAGNOSIS — Z887 Allergy status to serum and vaccine status: Secondary | ICD-10-CM | POA: Diagnosis not present

## 2020-07-28 DIAGNOSIS — Z8541 Personal history of malignant neoplasm of cervix uteri: Secondary | ICD-10-CM | POA: Diagnosis not present

## 2020-07-28 DIAGNOSIS — Z9049 Acquired absence of other specified parts of digestive tract: Secondary | ICD-10-CM | POA: Diagnosis not present

## 2020-07-28 DIAGNOSIS — Z79899 Other long term (current) drug therapy: Secondary | ICD-10-CM | POA: Diagnosis not present

## 2020-07-28 DIAGNOSIS — Z888 Allergy status to other drugs, medicaments and biological substances status: Secondary | ICD-10-CM | POA: Insufficient documentation

## 2020-07-28 DIAGNOSIS — Z8249 Family history of ischemic heart disease and other diseases of the circulatory system: Secondary | ICD-10-CM | POA: Diagnosis not present

## 2020-07-28 DIAGNOSIS — E119 Type 2 diabetes mellitus without complications: Secondary | ICD-10-CM | POA: Insufficient documentation

## 2020-07-28 DIAGNOSIS — Z90710 Acquired absence of both cervix and uterus: Secondary | ICD-10-CM | POA: Diagnosis not present

## 2020-07-28 DIAGNOSIS — Z90722 Acquired absence of ovaries, bilateral: Secondary | ICD-10-CM | POA: Insufficient documentation

## 2020-07-28 DIAGNOSIS — N200 Calculus of kidney: Secondary | ICD-10-CM

## 2020-07-28 HISTORY — PX: EXTRACORPOREAL SHOCK WAVE LITHOTRIPSY: SHX1557

## 2020-07-28 LAB — GLUCOSE, CAPILLARY: Glucose-Capillary: 131 mg/dL — ABNORMAL HIGH (ref 70–99)

## 2020-07-28 SURGERY — LITHOTRIPSY, ESWL
Anesthesia: Moderate Sedation | Laterality: Left

## 2020-07-28 MED ORDER — DIPHENHYDRAMINE HCL 25 MG PO CAPS
ORAL_CAPSULE | ORAL | Status: AC
  Administered 2020-07-28: 25 mg via ORAL
  Filled 2020-07-28: qty 1

## 2020-07-28 MED ORDER — ONDANSETRON HCL 4 MG/2ML IJ SOLN
INTRAMUSCULAR | Status: AC
  Administered 2020-07-28: 4 mg via INTRAVENOUS
  Filled 2020-07-28: qty 2

## 2020-07-28 MED ORDER — DIPHENHYDRAMINE HCL 25 MG PO CAPS: 25.0000 mg | ORAL_CAPSULE | ORAL | Status: AC

## 2020-07-28 MED ORDER — OXYCODONE-ACETAMINOPHEN 5-325 MG PO TABS: 1.0000 | ORAL_TABLET | ORAL | 0 refills | Status: AC | PRN

## 2020-07-28 MED ORDER — ONDANSETRON HCL 4 MG/2ML IJ SOLN: 4.0000 mg | Freq: Once | INTRAMUSCULAR | Status: AC | PRN

## 2020-07-28 MED ORDER — DIAZEPAM 5 MG PO TABS: 10.0000 mg | ORAL_TABLET | ORAL | Status: AC

## 2020-07-28 MED ORDER — DIAZEPAM 5 MG PO TABS
ORAL_TABLET | ORAL | Status: AC
  Administered 2020-07-28: 10 mg via ORAL
  Filled 2020-07-28: qty 2

## 2020-07-28 MED ORDER — SODIUM CHLORIDE 0.9 % IV SOLN: INTRAVENOUS | Status: DC

## 2020-07-28 NOTE — Discharge Instructions (Addendum)
Dr. Doristine Counter office will call you to set up follow up visit  Strain all urine with strainer provided.  Drink plenty of fluids, avoid caffeine AMBULATORY SURGERY  DISCHARGE INSTRUCTIONS   The drugs that you were given will stay in your system until tomorrow so for the next 24 hours you should not:  Drive an automobile Make any legal decisions Drink any alcoholic beverage   You may resume regular meals tomorrow.  Today it is better to start with liquids and gradually work up to solid foods.  You may eat anything you prefer, but it is better to start with liquids, then soup and crackers, and gradually work up to solid foods.   Please notify your doctor immediately if you have any unusual bleeding, trouble breathing, redness and pain at the surgery site, drainage, fever, or pain not relieved by medication.    Additional Instructions:        Please contact your physician with any problems or Same Day Surgery at [phone], Monday through Friday 6 am to 4 pm, or [place] at [facility] number at [phone].

## 2020-07-28 NOTE — Interval H&P Note (Signed)
UROLOGY H&P UPDATE  Agree with prior H&P dated 07/06/20.   Cardiac: RRR Lungs: CTA bilaterally  Laterality: left Procedure: shockwave lithotripsy  Informed consent obtained, we specifically discussed the risks of bleeding/hematoma, infection, post-operative pain, steinstrasse/obstructive fragments, need for additional procedures.  Billey Co, MD 07/28/2020

## 2020-07-28 NOTE — Brief Op Note (Signed)
07/28/2020  8:32 AM  PATIENT:  Martha Whitney  55 y.o. female  PRE-OPERATIVE DIAGNOSIS:  left 32m UPJ stone  POST-OPERATIVE DIAGNOSIS:  Same  PROCEDURE:  Procedure(s): EXTRACORPOREAL SHOCK WAVE LITHOTRIPSY (ESWL) (Left)  SURGEON:  Surgeon(s) and Role:    *Billey Co MD - Primary  ANESTHESIA: Conscious Sedation  EBL:  None  Drains: None  Specimen: None  Findings:  Stone smudged at conclusion of procedure, tolerated SWL well  DISPO: Pain meds PRN, RTC 2 weeks KUB  BNickolas Madrid MD 07/28/2020

## 2020-07-29 ENCOUNTER — Encounter: Payer: Self-pay | Admitting: Urology

## 2020-08-05 ENCOUNTER — Other Ambulatory Visit: Payer: Self-pay

## 2020-08-05 ENCOUNTER — Ambulatory Visit
Admission: RE | Admit: 2020-08-05 | Discharge: 2020-08-05 | Disposition: A | Discharge status: Home / Self Care | Payer: BC Managed Care – PPO | Source: Ambulatory Visit | Attending: Internal Medicine | Admitting: Internal Medicine

## 2020-08-05 DIAGNOSIS — Z1231 Encounter for screening mammogram for malignant neoplasm of breast: Secondary | ICD-10-CM

## 2020-08-24 NOTE — Progress Notes (Signed)
08/25/2020 10:09 PM   Martha Whitney 09/10/1965 [id number]  Referring provider: Kirk Ruths, MD [facility] [place],  [place] [id number]  Chief Complaint  Patient presents with   Nephrolithiasis    HPI: Martha Whitney is a 55 y.o. who is status post ESWL who presents today for follow up.  Underwent ESWL on 07/28/2020 for 7 mm left renal pelvic stone  with Dr. Diamantina Whitney.  Their postprocedural course was as expected and uneventful.   They have passed fragments.    They bring in fragments for analysis.   She is having frequency and urgency which falls in line to where the stone is located now in the left UVJ.  Patient denies any modifying or aggravating factors.  Patient denies any gross hematuria, dysuria or suprapubic/flank pain.  Patient denies any fevers, chills, nausea or vomiting.    KUB fragment in the left UVJ   UA 11-30 RBC's and calcium oxalate crystals.    PMH: Past Medical History:  Diagnosis Date   Arthritis    Cancer ([place])    skin ca on face/CERVICAL EARYLY 20'S   Diabetes mellitus without complication ([place])    Difficult intubation    Endometriosis determined by laparoscopy    GERD (gastroesophageal reflux disease)    History of kidney stones    H/O   Restless leg syndrome 2007    Surgical History: Past Surgical History:  Procedure Laterality Date   ABDOMINAL HYSTERECTOMY     CARPAL TUNNEL RELEASE Left 2003   CHOLECYSTECTOMY     CYSTECTOMY N/A 1969   cyst removed from throat   EXTRACORPOREAL SHOCK WAVE LITHOTRIPSY Left 07/28/2020   Procedure: EXTRACORPOREAL SHOCK WAVE LITHOTRIPSY (ESWL);  Surgeon: Martha Co, MD;  Location: [facility];  Service: Urology;  Laterality: Left;   GANGLION CYST EXCISION Left 10/21/2014   Procedure: REMOVAL GANGLION OF WRIST;  Surgeon: Martha Knows, MD;  Location: [facility];  Service: Orthopedics;  Laterality: Left;   LAPAROSCOPIC SALPINGO OOPHERECTOMY Bilateral 06/22/2014    Procedure: LAPAROSCOPIC SALPINGO OOPHORECTOMY;  Surgeon: Martha Halim, MD;  Location: [facility];  Service: Gynecology;  Laterality: Bilateral;   LAPAROSCOPIC TOTAL HYSTERECTOMY     LEEP N/A 1995   SHOULDER ARTHROSCOPY WITH OPEN ROTATOR CUFF REPAIR Right 07/09/2017   Procedure: SHOULDER ARTHROSCOPY WITH OPEN ROTATOR CUFF REPAIR;  Surgeon: Martha Mull, MD;  Location: [facility];  Service: Orthopedics;  Laterality: Right;  extensive debridement, decompression, miniopen biceps tenodesis, mini open rotator cuff repair   TONSILLECTOMY     TOTAL SHOULDER ARTHROPLASTY Right 02/18/2020   Procedure: TOTAL SHOULDER ARTHROPLASTY;  Surgeon: Martha Mull, MD;  Location: [facility];  Service: Orthopedics;  Laterality: Right;    Home Medications:  Current Outpatient Medications on File Prior to Visit  Medication Sig Dispense Refill   gabapentin (NEURONTIN) 400 MG capsule Take 400 mg by mouth 2 (two) times daily as needed (for restless legs).     nitrofurantoin, macrocrystal-monohydrate, (MACROBID) 100 MG capsule Take 1 capsule (100 mg total) by mouth 2 (two) times daily. 14 capsule 0   pantoprazole (PROTONIX) 40 MG tablet Take 40 mg by mouth every morning.     pantoprazole (PROTONIX) 40 MG tablet Take 1 tablet by mouth daily.     pravastatin (PRAVACHOL) 20 MG tablet Take 20 mg by mouth every evening.     Semaglutide (RYBELSUS) 7 MG TABS Take 7 mg by mouth every morning.     traZODone (DESYREL) 50 MG  tablet Take 50-200 mg by mouth at bedtime.     No current facility-administered medications on file prior to visit.    Allergies:  Allergies  Allergen Reactions   Barbital Hives   Influenza Virus Vaccine Hives   Sulfa Antibiotics Hives   Other Itching and Other (See Comments)    Surgical Glue; Skin Irritation   Pneumococcal Vaccines Swelling   Tape Rash    Family History: Family History  Problem Relation Age of Onset   Heart disease Father    Heart disease Maternal Grandmother    Heart disease  Paternal Grandmother    Breast cancer Neg Hx     Social History:  reports that she has never smoked. She has never used smokeless tobacco. She reports that she does not currently use alcohol. She reports that she does not use drugs.  ROS: Pertinent ROS in HPI  Physical Exam: BP (!) 151/84   Pulse 85   Ht '5\' 3"'$  (1.6 m)   Wt 160 lb (72.6 kg)   BMI 28.34 kg/m   Constitutional:  Well nourished. Alert and oriented, No acute distress. HEENT: [place] AT, mask in place.   Trachea midline. Cardiovascular: No clubbing, cyanosis, or edema. Respiratory: Normal respiratory effort, no increased work of breathing. Neurologic: Grossly intact, no focal deficits, moving all 4 extremities. Psychiatric: Normal mood and affect.  Laboratory Data: Lab Results  Component Value Date   WBC 11.7 (H) 06/22/2020   HGB 13.0 06/22/2020   HCT 38.3 06/22/2020   MCV 85.5 06/22/2020   PLT 262 06/22/2020    Lab Results  Component Value Date   CREATININE 0.92 06/22/2020    Urinalysis Component     Latest Ref Rng & Units 08/25/2020  Specific Gravity, UA     1.005 - 1.030 1.025  pH, UA     5.0 - 7.5 6.0  Color, UA     Yellow Yellow  Appearance Ur     Clear Hazy (A)  Leukocytes,UA     Negative Negative  Protein,UA     Negative/Trace Negative  Glucose, UA     Negative Negative  Ketones, UA     Negative Negative  RBC, UA     Negative 2+ (A)  Bilirubin, UA     Negative Negative  Urobilinogen, Ur     0.2 - 1.0 mg/dL 0.2  Nitrite, UA     Negative Negative  Microscopic Examination      See below:   Component     Latest Ref Rng & Units 08/25/2020  WBC, UA     0 - 5 /hpf 0-5  RBC     0 - 2 /hpf 11-30 (A)  Epithelial Cells (non renal)     0 - 10 /hpf 0-10  Casts     None seen /lpf Present (A)  Cast Type     N/A Hyaline casts  Crystals     N/A Present (A)  Crystal Type     N/A Calcium Oxalate  Mucus, UA     Not Estab. Present (A)  Bacteria, UA     None seen/Few None seen  I have  reviewed the labs.   Pertinent Imaging: CLINICAL DATA:  Left nephrolithiasis status post lithotripsy 07/28/2020   EXAM: ABDOMEN - 1 VIEW   COMPARISON:  07/28/2020   FINDINGS: Previously noted calculus adjacent to the left transverse process of L3 is no longer visualized. There has developed, however, a 6 mm x 2 mm calculus within the left hemipelvis  possibly representing a residual distal left ureteral calculus. No additional nephro or urolithiasis identified. Normal abdominal gas pattern. No acute bone abnormality.   IMPRESSION: Interval resolution of calculus within the expected left ureterovesicular junction. Possible tiny residual calculus within the distal left ureter measuring 2 x 6 mm. No additional nephrolithiasis.     Electronically Signed   By: Fidela Salisbury M.D.   On: 08/25/2020 20:35  I have independently reviewed the films.  See HPI.   Assessment & Plan:    1. Left ureteral stone - stone sent for analysis  -Small fragment still present in the left UVJ -She will return in 2 weeks for follow-up KUB -Given prescription tamsulosin 0.4 mg to facilitate medical expulsion of the fragment  2. Gross hematuria - UA today demonstrates micro heme  - continue to monitor the patient's UA after the treatment/passage of the stone to ensure the hematuria has resolved - if hematuria persists, we will pursue a hematuria workup with CT Urogram and cystoscopy if appropriate.    Return in about 2 weeks (around 09/08/2020) for KUB, UA and office visit .  These notes generated with voice recognition software. I apologize for typographical errors.  Zara Council, PA-C  [facility] [address]  [place], [place] [address] [phone]

## 2020-08-25 ENCOUNTER — Ambulatory Visit
Admission: RE | Admit: 2020-08-25 | Discharge: 2020-08-25 | Disposition: A | Discharge status: Home / Self Care | Payer: BC Managed Care – PPO | Source: Ambulatory Visit | Attending: Urology | Admitting: Urology

## 2020-08-25 ENCOUNTER — Ambulatory Visit (INDEPENDENT_AMBULATORY_CARE_PROVIDER_SITE_OTHER): Payer: BC Managed Care – PPO | Admitting: Urology

## 2020-08-25 ENCOUNTER — Other Ambulatory Visit: Payer: Self-pay

## 2020-08-25 ENCOUNTER — Other Ambulatory Visit: Payer: Self-pay | Admitting: *Deleted

## 2020-08-25 ENCOUNTER — Encounter: Payer: Self-pay | Admitting: Urology

## 2020-08-25 VITALS — BP 151/84 | HR 85 | Ht 63.0 in | Wt 160.0 lb

## 2020-08-25 DIAGNOSIS — R3129 Other microscopic hematuria: Secondary | ICD-10-CM

## 2020-08-25 DIAGNOSIS — N200 Calculus of kidney: Secondary | ICD-10-CM | POA: Diagnosis present

## 2020-08-25 MED ORDER — TAMSULOSIN HCL 0.4 MG PO CAPS
0.4000 mg | ORAL_CAPSULE | Freq: Every day | ORAL | 0 refills | Status: DC
Start: 2020-08-25 — End: 2020-09-16

## 2020-08-26 LAB — MICROSCOPIC EXAMINATION: Bacteria, UA: NONE SEEN

## 2020-08-26 LAB — URINALYSIS, COMPLETE
Bilirubin, UA: NEGATIVE
Glucose, UA: NEGATIVE
Ketones, UA: NEGATIVE
Leukocytes,UA: NEGATIVE
Nitrite, UA: NEGATIVE
Protein,UA: NEGATIVE
Specific Gravity, UA: 1.025 (ref 1.005–1.030)
Urobilinogen, Ur: 0.2 mg/dL (ref 0.2–1.0)
pH, UA: 6 (ref 5.0–7.5)

## 2020-08-29 ENCOUNTER — Telehealth: Payer: Self-pay | Admitting: Urology

## 2020-08-29 ENCOUNTER — Other Ambulatory Visit: Payer: Self-pay | Admitting: Urology

## 2020-08-29 DIAGNOSIS — N200 Calculus of kidney: Secondary | ICD-10-CM

## 2020-08-29 MED ORDER — OXYCODONE-ACETAMINOPHEN 5-325 MG PO TABS: 1.0000 | ORAL_TABLET | ORAL | 0 refills | Status: DC | PRN

## 2020-08-29 MED ORDER — ONDANSETRON HCL 4 MG PO TABS: 4.0000 mg | ORAL_TABLET | Freq: Three times a day (TID) | ORAL | 0 refills | Status: DC | PRN

## 2020-08-29 NOTE — Telephone Encounter (Signed)
Patient called the office this morning requesting prescriptions for nausea and pain to be sent to the [facility] on [address].  Patient states that her pain is a 6 on a scale of 0-10.

## 2020-08-30 LAB — CALCULI, WITH PHOTOGRAPH (CLINICAL LAB)
Calcium Oxalate Dihydrate: 30 %
Calcium Oxalate Monohydrate: 70 %
Weight Calculi: 10 mg

## 2020-09-12 ENCOUNTER — Ambulatory Visit
Admission: RE | Admit: 2020-09-12 | Discharge: 2020-09-12 | Disposition: A | Discharge status: Home / Self Care | Payer: BC Managed Care – PPO | Source: Ambulatory Visit | Attending: Urology | Admitting: Urology

## 2020-09-12 DIAGNOSIS — N200 Calculus of kidney: Secondary | ICD-10-CM

## 2020-09-12 NOTE — H&P (View-Only) (Signed)
09/13/2020 1:27 PM   Martha Whitney April 05, 1965 [id number]  Referring provider: Kirk Ruths, MD [facility] [place],  [place] [address]  Urological history: 1. Nephrolithiasis -stone composition of 70% calcium oxalate monohydrate and 30% calcium oxalate dihydrate -s/p Left ESWL in 07/2020 -KUB left UVJ stone  Chief Complaint  Patient presents with   Nephrolithiasis     HPI: CARAN STORCK is a 55 y.o. who is status post ESWL who presents today for follow up.  Underwent ESWL on 07/28/2020 for 7 mm left renal pelvic stone  with Dr. Diamantina Providence.  She was found to have a fragment in her left UVJ on follow up KUB.    She continues to have some twinges in her left lower quadrant, but she has not had any passage of fragments.  She also continues to have frequency and urgency.  Patient denies any modifying or aggravating factors.  Patient denies any gross hematuria, dysuria or suprapubic/flank pain.  Patient denies any fevers, chills, nausea or vomiting.    UA 6-10 WBC's and 3-10 RBC's   KUB left UVJ stone persists    PMH: Past Medical History:  Diagnosis Date   Arthritis    Cancer ([place])    skin ca on face/CERVICAL EARYLY 20'S   Diabetes mellitus without complication ([place])    Difficult intubation    Endometriosis determined by laparoscopy    GERD (gastroesophageal reflux disease)    History of kidney stones    H/O   Pneumonia    Restless leg syndrome 2007    Surgical History: Past Surgical History:  Procedure Laterality Date   ABDOMINAL HYSTERECTOMY     CARPAL TUNNEL RELEASE Left 2003   CHOLECYSTECTOMY     COLONOSCOPY     CYSTECTOMY N/A 1969   cyst removed from throat   EXTRACORPOREAL SHOCK WAVE LITHOTRIPSY Left 07/28/2020   Procedure: EXTRACORPOREAL SHOCK WAVE LITHOTRIPSY (ESWL);  Surgeon: Billey Co, MD;  Location: [facility] ORS;  Service: Urology;  Laterality: Left;   GANGLION CYST EXCISION Left 10/21/2014    Procedure: REMOVAL GANGLION OF WRIST;  Surgeon: Hessie Knows, MD;  Location: [facility];  Service: Orthopedics;  Laterality: Left;   LAPAROSCOPIC SALPINGO OOPHERECTOMY Bilateral 06/22/2014   Procedure: LAPAROSCOPIC SALPINGO OOPHORECTOMY;  Surgeon: Aletha Halim, MD;  Location: [facility];  Service: Gynecology;  Laterality: Bilateral;   LAPAROSCOPIC TOTAL HYSTERECTOMY     LEEP N/A 1995   MULTIPLE TOOTH EXTRACTIONS     SHOULDER ARTHROSCOPY WITH OPEN ROTATOR CUFF REPAIR Right 07/09/2017   Procedure: SHOULDER ARTHROSCOPY WITH OPEN ROTATOR CUFF REPAIR;  Surgeon: Corky Mull, MD;  Location: [facility];  Service: Orthopedics;  Laterality: Right;  extensive debridement, decompression, miniopen biceps tenodesis, mini open rotator cuff repair   TONSILLECTOMY     TOTAL SHOULDER ARTHROPLASTY Right 02/18/2020   Procedure: TOTAL SHOULDER ARTHROPLASTY;  Surgeon: Corky Mull, MD;  Location: [facility];  Service: Orthopedics;  Laterality: Right;    Home Medications:  Current Outpatient Medications on File Prior to Visit  Medication Sig Dispense Refill   gabapentin (NEURONTIN) 400 MG capsule Take 400 mg by mouth 2 (two) times daily as needed (for restless legs).     ondansetron (ZOFRAN) 4 MG tablet Take 1 tablet (4 mg total) by mouth every 8 (eight) hours as needed for nausea or vomiting. 20 tablet 0   pantoprazole (PROTONIX) 40 MG tablet Take 40 mg by mouth daily.     pravastatin (PRAVACHOL) 20 MG tablet  Take 20 mg by mouth every evening.     Semaglutide (RYBELSUS) 7 MG TABS Take 7 mg by mouth every morning.     traZODone (DESYREL) 50 MG tablet Take 50-200 mg by mouth at bedtime.     No current facility-administered medications on file prior to visit.    Allergies:  Allergies  Allergen Reactions   Barbital Hives   Influenza Virus Vaccine Hives   Sulfa Antibiotics Hives   Other Itching and Other (See Comments)    Surgical Glue; Skin Irritation   Pneumococcal Vaccines Swelling   Tape Rash    Family  History: Family History  Problem Relation Age of Onset   Heart disease Father    Heart disease Maternal Grandmother    Heart disease Paternal Grandmother    Breast cancer Neg Hx     Social History:  reports that she has never smoked. She has never used smokeless tobacco. She reports that she does not currently use alcohol. She reports that she does not use drugs.  ROS: Pertinent ROS in HPI  Physical Exam: BP 110/73   Pulse 84   Ht _0  (1.6 m)   Wt 165 lb (74.8 kg)   BMI 29.23 kg/m   Constitutional:  Well nourished. Alert and oriented, No acute distress. HEENT: [place] AT, mask in place.  Trachea midline Cardiovascular: No clubbing, cyanosis, or edema. Respiratory: Normal respiratory effort, no increased work of breathing. Neurologic: Grossly intact, no focal deficits, moving all 4 extremities. Psychiatric: Normal mood and affect.    Laboratory Data: Hemoglobin A1C 4.2 - 5.6 % 6.4 High    Average Blood Glucose (Calc) mg/dL 137   Resulting Agency  [facility] - LAB  Narrative Performed by [facility] - LAB Normal Range:    4.2 - 5.6%  Increased Risk:  5.7 - 6.4%  Diabetes:        >= 6.5%  Glycemic Control for adults with diabetes:  <7%   Specimen Collected: 09/07/20 16:20 Last Resulted: 09/08/20 10:17  Received From: [facility]  Result Received: 09/12/20 13:43   Glucose 70 - 110 mg/dL 129 High    Sodium 136 - 145 mmol/L 141   Potassium 3.6 - 5.1 mmol/L 3.5 Low    Chloride 97 - 109 mmol/L 104   Carbon Dioxide (CO2) 22.0 - 32.0 mmol/L 30.7   Urea Nitrogen (BUN) 7 - 25 mg/dL 13   Creatinine 0.6 - 1.1 mg/dL 0.8   Glomerular Filtration Rate (eGFR), MDRD Estimate >60 mL/min/1.73sq m 74   Calcium 8.7 - 10.3 mg/dL 9.1   AST  8 - 39 U/L 17   ALT  5 - 38 U/L 19   Alk Phos (alkaline Phosphatase) 34 - 104 U/L 91   Albumin 3.5 - 4.8 g/dL 4.4   Bilirubin, Total 0.3 - 1.2 mg/dL 0.3   Protein, Total 6.1 - 7.9 g/dL 6.9   A/G Ratio 1.0 - 5.0 gm/dL  1.8   Resulting Agency  [facility] - LAB  Specimen Collected: 09/07/20 16:20 Last Resulted: 09/07/20 17:53  Received From: [facility]  Result Received: 09/08/20 08:46    Urinalysis Component     Latest Ref Rng & Units 09/13/2020  Specific Gravity, UA     1.005 - 1.030 >1.030 (H)  pH, UA     5.0 - 7.5 5.5  Color, UA     Yellow Orange  Appearance Ur     Clear Hazy (A)  Leukocytes,UA  Negative Negative  Protein,UA     Negative/Trace 1+ (A)  Glucose, UA     Negative Negative  Ketones, UA     Negative Negative  RBC, UA     Negative Negative  Bilirubin, UA     Negative Negative  Urobilinogen, Ur     0.2 - 1.0 mg/dL 0.2  Nitrite, UA     Negative Negative  Microscopic Examination      See below:   Component     Latest Ref Rng & Units 09/13/2020  WBC, UA     0 - 5 /hpf 6-10 (A)  RBC     0 - 2 /hpf 3-10 (A)  Epithelial Cells (non renal)     0 - 10 /hpf 0-10  Casts     None seen /lpf Present (A)  Cast Type     N/A Hyaline casts  Mucus, UA     Not Estab. Present (A)  Bacteria, UA     None seen/Few None seen  I have reviewed the labs.   Pertinent Imaging: CLINICAL DATA:  Kidney stone.   EXAM: ABDOMEN - 1 VIEW   COMPARISON:  08/25/2020.  CT 06/22/2020.   FINDINGS: Surgical clips right upper quadrant. No bowel distention. No renal stones noted. Persistent 6 mm calcification noted over the distal left ureter in unchanged position. This again may represent a distal left ureteral stone. Tiny lower pelvic calcifications again noted consistent phleboliths. No acute bony abnormality.   IMPRESSION: Persistent 6 mm calcification noted of the distal left ureter in unchanged position. This again may represent a distal left ureteral stone.     Electronically Signed   By: Marcello Moores  Register M.D.   On: 09/14/2020 06:57     I have independently reviewed the films.  See HPI.   Assessment & Plan:    1. Left ureteral stone -stone still  present in the left UVJ -patient would like to pursue definitive treatment when she returns from the beach -We discussed various treatment options for urolithiasis including observation with or without medical expulsive therapy, shockwave lithotripsy (SWL), ureteroscopy and laser lithotripsy with stent placement. -We discussed that management is based on stone size, location, density, patient co-morbidities, and patient preference.  -Stones <55m in size have a >80% spontaneous passage rate. Data surrounding the use of tamsulosin for medical expulsive therapy is controversial, but meta analyses suggests it is most efficacious for distal stones between 5-176min size. Possible side effects include dizziness/lightheadedness -SWL has a lower stone free rate in a single procedure, but also a lower complication rate compared to ureteroscopy and avoids a stent and associated stent related symptoms. Possible complications include renal hematoma, steinstrasse, and need for additional treatment. -Ureteroscopy with laser lithotripsy and stent placement has a higher stone free rate than SWL in a single procedure, however increased complication rate including possible infection, ureteral injury, bleeding, and stent related morbidity. Common stent related symptoms include dysuria, urgency/frequency, and flank pain - schedule left ureteroscopy with laser lithotripsy and ureteral stent placement - explained to the patient how the procedure is performed and the risks involved - informed patient that they will have a stent placed during the procedure and will remain in place after the procedure for a short time.  - stent may be removed in the office with a cystoscope or patient may be instructed to remove the stent themselves by the string - described "stent pain" as feelings of needing to urinate/overactive bladder and a warm, tingling sensation to  intense pain in the affected flank - residual stones within the kidney or  ureter may be present after the procedure and may need to have these addressed at a different encounter - injury to the ureter is the most common intra-operative risk, it may result in an open procedure to correct the defect - infection and bleeding are also risks - explained the risks of general anesthesia, such as: MI, CVA, paralysis, coma and/or death. - advised to contact our office or seek treatment in the ED if becomes febrile or pain/ vomiting are difficult control in order to arrange for emergent/urgent intervention       2. Gross hematuria - UA today demonstrates micro heme  - continue to monitor the patient's UA after the treatment/passage of the stone to ensure the hematuria has resolved - if hematuria persists, we will pursue a hematuria workup with CT Urogram and cystoscopy if appropriate.    Return for left URS/LL/ureteral stent placement.  These notes generated with voice recognition software. I apologize for typographical errors.  Zara Council, PA-C  [facility] [address]  [place], [place] [address] [phone]

## 2020-09-12 NOTE — Progress Notes (Signed)
09/13/2020 1:27 PM   Martha Whitney April 05, 1965 [id number]  Referring provider: Kirk Ruths, MD [facility] [place],  [place] [address]  Urological history: 1. Nephrolithiasis -stone composition of 70% calcium oxalate monohydrate and 30% calcium oxalate dihydrate -s/p Left ESWL in 07/2020 -KUB left UVJ stone  Chief Complaint  Patient presents with   Nephrolithiasis     HPI: Martha Whitney is a 55 y.o. who is status post ESWL who presents today for follow up.  Underwent ESWL on 07/28/2020 for 7 mm left renal pelvic stone  with Dr. Diamantina Providence.  She was found to have a fragment in her left UVJ on follow up KUB.    She continues to have some twinges in her left lower quadrant, but she has not had any passage of fragments.  She also continues to have frequency and urgency.  Patient denies any modifying or aggravating factors.  Patient denies any gross hematuria, dysuria or suprapubic/flank pain.  Patient denies any fevers, chills, nausea or vomiting.    UA 6-10 WBC's and 3-10 RBC's   KUB left UVJ stone persists    PMH: Past Medical History:  Diagnosis Date   Arthritis    Cancer ([place])    skin ca on face/CERVICAL EARYLY 20'S   Diabetes mellitus without complication ([place])    Difficult intubation    Endometriosis determined by laparoscopy    GERD (gastroesophageal reflux disease)    History of kidney stones    H/O   Pneumonia    Restless leg syndrome 2007    Surgical History: Past Surgical History:  Procedure Laterality Date   ABDOMINAL HYSTERECTOMY     CARPAL TUNNEL RELEASE Left 2003   CHOLECYSTECTOMY     COLONOSCOPY     CYSTECTOMY N/A 1969   cyst removed from throat   EXTRACORPOREAL SHOCK WAVE LITHOTRIPSY Left 07/28/2020   Procedure: EXTRACORPOREAL SHOCK WAVE LITHOTRIPSY (ESWL);  Surgeon: Billey Co, MD;  Location: [facility];  Service: Urology;  Laterality: Left;   GANGLION CYST EXCISION Left 10/21/2014    Procedure: REMOVAL GANGLION OF WRIST;  Surgeon: Hessie Knows, MD;  Location: [facility];  Service: Orthopedics;  Laterality: Left;   LAPAROSCOPIC SALPINGO OOPHERECTOMY Bilateral 06/22/2014   Procedure: LAPAROSCOPIC SALPINGO OOPHORECTOMY;  Surgeon: Aletha Halim, MD;  Location: [facility];  Service: Gynecology;  Laterality: Bilateral;   LAPAROSCOPIC TOTAL HYSTERECTOMY     LEEP N/A 1995   MULTIPLE TOOTH EXTRACTIONS     SHOULDER ARTHROSCOPY WITH OPEN ROTATOR CUFF REPAIR Right 07/09/2017   Procedure: SHOULDER ARTHROSCOPY WITH OPEN ROTATOR CUFF REPAIR;  Surgeon: Corky Mull, MD;  Location: [facility];  Service: Orthopedics;  Laterality: Right;  extensive debridement, decompression, miniopen biceps tenodesis, mini open rotator cuff repair   TONSILLECTOMY     TOTAL SHOULDER ARTHROPLASTY Right 02/18/2020   Procedure: TOTAL SHOULDER ARTHROPLASTY;  Surgeon: Corky Mull, MD;  Location: [facility];  Service: Orthopedics;  Laterality: Right;    Home Medications:  Current Outpatient Medications on File Prior to Visit  Medication Sig Dispense Refill   gabapentin (NEURONTIN) 400 MG capsule Take 400 mg by mouth 2 (two) times daily as needed (for restless legs).     ondansetron (ZOFRAN) 4 MG tablet Take 1 tablet (4 mg total) by mouth every 8 (eight) hours as needed for nausea or vomiting. 20 tablet 0   pantoprazole (PROTONIX) 40 MG tablet Take 40 mg by mouth daily.     pravastatin (PRAVACHOL) 20 MG tablet  Take 20 mg by mouth every evening.     Semaglutide (RYBELSUS) 7 MG TABS Take 7 mg by mouth every morning.     traZODone (DESYREL) 50 MG tablet Take 50-200 mg by mouth at bedtime.     No current facility-administered medications on file prior to visit.    Allergies:  Allergies  Allergen Reactions   Barbital Hives   Influenza Virus Vaccine Hives   Sulfa Antibiotics Hives   Other Itching and Other (See Comments)    Surgical Glue; Skin Irritation   Pneumococcal Vaccines Swelling   Tape Rash    Family  History: Family History  Problem Relation Age of Onset   Heart disease Father    Heart disease Maternal Grandmother    Heart disease Paternal Grandmother    Breast cancer Neg Hx     Social History:  reports that she has never smoked. She has never used smokeless tobacco. She reports that she does not currently use alcohol. She reports that she does not use drugs.  ROS: Pertinent ROS in HPI  Physical Exam: BP 110/73   Pulse 84   Ht _0  (1.6 m)   Wt 165 lb (74.8 kg)   BMI 29.23 kg/m   Constitutional:  Well nourished. Alert and oriented, No acute distress. HEENT: [place] AT, mask in place.  Trachea midline Cardiovascular: No clubbing, cyanosis, or edema. Respiratory: Normal respiratory effort, no increased work of breathing. Neurologic: Grossly intact, no focal deficits, moving all 4 extremities. Psychiatric: Normal mood and affect.    Laboratory Data: Hemoglobin A1C 4.2 - 5.6 % 6.4 High    Average Blood Glucose (Calc) mg/dL 137   Resulting Agency  [facility] - LAB  Narrative Performed by [facility] - LAB Normal Range:    4.2 - 5.6%  Increased Risk:  5.7 - 6.4%  Diabetes:        >= 6.5%  Glycemic Control for adults with diabetes:  <7%   Specimen Collected: 09/07/20 16:20 Last Resulted: 09/08/20 10:17  Received From: [facility]  Result Received: 09/12/20 13:43   Glucose 70 - 110 mg/dL 129 High    Sodium 136 - 145 mmol/L 141   Potassium 3.6 - 5.1 mmol/L 3.5 Low    Chloride 97 - 109 mmol/L 104   Carbon Dioxide (CO2) 22.0 - 32.0 mmol/L 30.7   Urea Nitrogen (BUN) 7 - 25 mg/dL 13   Creatinine 0.6 - 1.1 mg/dL 0.8   Glomerular Filtration Rate (eGFR), MDRD Estimate >60 mL/min/1.73sq m 74   Calcium 8.7 - 10.3 mg/dL 9.1   AST  8 - 39 U/L 17   ALT  5 - 38 U/L 19   Alk Phos (alkaline Phosphatase) 34 - 104 U/L 91   Albumin 3.5 - 4.8 g/dL 4.4   Bilirubin, Total 0.3 - 1.2 mg/dL 0.3   Protein, Total 6.1 - 7.9 g/dL 6.9   A/G Ratio 1.0 - 5.0 gm/dL  1.8   Resulting Agency  [facility] - LAB  Specimen Collected: 09/07/20 16:20 Last Resulted: 09/07/20 17:53  Received From: [facility]  Result Received: 09/08/20 08:46    Urinalysis Component     Latest Ref Rng & Units 09/13/2020  Specific Gravity, UA     1.005 - 1.030 >1.030 (H)  pH, UA     5.0 - 7.5 5.5  Color, UA     Yellow Orange  Appearance Ur     Clear Hazy (A)  Leukocytes,UA  Negative Negative  Protein,UA     Negative/Trace 1+ (A)  Glucose, UA     Negative Negative  Ketones, UA     Negative Negative  RBC, UA     Negative Negative  Bilirubin, UA     Negative Negative  Urobilinogen, Ur     0.2 - 1.0 mg/dL 0.2  Nitrite, UA     Negative Negative  Microscopic Examination      See below:   Component     Latest Ref Rng & Units 09/13/2020  WBC, UA     0 - 5 /hpf 6-10 (A)  RBC     0 - 2 /hpf 3-10 (A)  Epithelial Cells (non renal)     0 - 10 /hpf 0-10  Casts     None seen /lpf Present (A)  Cast Type     N/A Hyaline casts  Mucus, UA     Not Estab. Present (A)  Bacteria, UA     None seen/Few None seen  I have reviewed the labs.   Pertinent Imaging: CLINICAL DATA:  Kidney stone.   EXAM: ABDOMEN - 1 VIEW   COMPARISON:  08/25/2020.  CT 06/22/2020.   FINDINGS: Surgical clips right upper quadrant. No bowel distention. No renal stones noted. Persistent 6 mm calcification noted over the distal left ureter in unchanged position. This again may represent a distal left ureteral stone. Tiny lower pelvic calcifications again noted consistent phleboliths. No acute bony abnormality.   IMPRESSION: Persistent 6 mm calcification noted of the distal left ureter in unchanged position. This again may represent a distal left ureteral stone.     Electronically Signed   By: Marcello Moores  Register M.D.   On: 09/14/2020 06:57     I have independently reviewed the films.  See HPI.   Assessment & Plan:    1. Left ureteral stone -stone still  present in the left UVJ -patient would like to pursue definitive treatment when she returns from the beach -We discussed various treatment options for urolithiasis including observation with or without medical expulsive therapy, shockwave lithotripsy (SWL), ureteroscopy and laser lithotripsy with stent placement. -We discussed that management is based on stone size, location, density, patient co-morbidities, and patient preference.  -Stones <55m in size have a >80% spontaneous passage rate. Data surrounding the use of tamsulosin for medical expulsive therapy is controversial, but meta analyses suggests it is most efficacious for distal stones between 5-176min size. Possible side effects include dizziness/lightheadedness -SWL has a lower stone free rate in a single procedure, but also a lower complication rate compared to ureteroscopy and avoids a stent and associated stent related symptoms. Possible complications include renal hematoma, steinstrasse, and need for additional treatment. -Ureteroscopy with laser lithotripsy and stent placement has a higher stone free rate than SWL in a single procedure, however increased complication rate including possible infection, ureteral injury, bleeding, and stent related morbidity. Common stent related symptoms include dysuria, urgency/frequency, and flank pain - schedule left ureteroscopy with laser lithotripsy and ureteral stent placement - explained to the patient how the procedure is performed and the risks involved - informed patient that they will have a stent placed during the procedure and will remain in place after the procedure for a short time.  - stent may be removed in the office with a cystoscope or patient may be instructed to remove the stent themselves by the string - described "stent pain" as feelings of needing to urinate/overactive bladder and a warm, tingling sensation to  intense pain in the affected flank - residual stones within the kidney or  ureter may be present after the procedure and may need to have these addressed at a different encounter - injury to the ureter is the most common intra-operative risk, it may result in an open procedure to correct the defect - infection and bleeding are also risks - explained the risks of general anesthesia, such as: MI, CVA, paralysis, coma and/or death. - advised to contact our office or seek treatment in the ED if becomes febrile or pain/ vomiting are difficult control in order to arrange for emergent/urgent intervention       2. Gross hematuria - UA today demonstrates micro heme  - continue to monitor the patient's UA after the treatment/passage of the stone to ensure the hematuria has resolved - if hematuria persists, we will pursue a hematuria workup with CT Urogram and cystoscopy if appropriate.    Return for left URS/LL/ureteral stent placement.  These notes generated with voice recognition software. I apologize for typographical errors.  Zara Council, PA-C  [facility] [address]  [place], [place] [address] [phone]

## 2020-09-13 ENCOUNTER — Encounter: Payer: Self-pay | Admitting: Urology

## 2020-09-13 ENCOUNTER — Ambulatory Visit (INDEPENDENT_AMBULATORY_CARE_PROVIDER_SITE_OTHER): Payer: BC Managed Care – PPO | Admitting: Urology

## 2020-09-13 ENCOUNTER — Other Ambulatory Visit: Payer: Self-pay

## 2020-09-13 VITALS — BP 110/73 | HR 84 | Ht 63.0 in | Wt 165.0 lb

## 2020-09-13 DIAGNOSIS — N200 Calculus of kidney: Secondary | ICD-10-CM

## 2020-09-13 DIAGNOSIS — R3129 Other microscopic hematuria: Secondary | ICD-10-CM

## 2020-09-14 ENCOUNTER — Other Ambulatory Visit: Payer: Self-pay | Admitting: Urology

## 2020-09-14 LAB — URINALYSIS, COMPLETE
Bilirubin, UA: NEGATIVE
Glucose, UA: NEGATIVE
Ketones, UA: NEGATIVE
Leukocytes,UA: NEGATIVE
Nitrite, UA: NEGATIVE
RBC, UA: NEGATIVE
Specific Gravity, UA: >1.03 — ABNORMAL HIGH (ref 1.005–1.030)
Urobilinogen, Ur: 0.2 mg/dL (ref 0.2–1.0)
pH, UA: 5.5 (ref 5.0–7.5)

## 2020-09-14 LAB — MICROSCOPIC EXAMINATION: Bacteria, UA: NONE SEEN

## 2020-09-16 ENCOUNTER — Other Ambulatory Visit: Payer: Self-pay | Admitting: Urology

## 2020-09-16 DIAGNOSIS — N200 Calculus of kidney: Secondary | ICD-10-CM

## 2020-09-16 DIAGNOSIS — R3129 Other microscopic hematuria: Secondary | ICD-10-CM

## 2020-09-16 LAB — CULTURE, URINE COMPREHENSIVE

## 2020-09-19 ENCOUNTER — Telehealth: Payer: Self-pay | Admitting: Urology

## 2020-09-19 ENCOUNTER — Other Ambulatory Visit: Payer: Self-pay

## 2020-09-19 DIAGNOSIS — N201 Calculus of ureter: Secondary | ICD-10-CM

## 2020-09-19 NOTE — Telephone Encounter (Signed)
Spoke with patient and she was going to the beach but due to urinated a lot and pain, she canceled her trip and would now like to try the laser removal of the stone.  Spoke with Larene Beach verbally and she will coordinate with [facility] about surgery.

## 2020-09-19 NOTE — Telephone Encounter (Signed)
Spoke with patient and advised results   

## 2020-09-19 NOTE — Progress Notes (Signed)
Surgical Physician Order Form   Scheduling time expectation :  1 to 2 weeks   *Length of Case: _________    *Clearance needed: no     PCP     _0       Cardiology    _1        Pulmonology _2     *Anticoagulation: Hold all anticoagulation [no]   May continue aspirin _3          May continue all anticoagulants _4    *Post-op visit Date/Instructions: TBD         KUB prior _5     RUS prior  _6     Voiding trial  _7     Cysto stent removal  _8    Follow up with: MD or PA-C   *Diagnosis: Left UVJ stone   *Procedure: Left URS/LL/ureteral stent placement   Admit type: Outpatient  Yes  Observation _9    Inpatient _10    Number of Days: ___    Anesthesia: general    Labs: Per Anesthesia  [YES]     UA&Urine Culture [No]   CBC  _11    B MET   _12    PT/INR  _13   aPTT  _14          Urine Pregnancy _15    Urine Drug Screen _16     Type & Screen  _17    Crossmatch: ____ units Other: ________    Tests: EKG/Chest x-ray per Anesthesia [YES]        Chest X-ray  _18   EKG _19   KUB day of procedure  _20     Medications:   Ancef 1g IV  _21       Ancef 2g IV  _22   Ciprofloxacin 412m IV  _23    Vancomycin 1g IV  _24   Ceftriaxone (Rocephin) 1g IV  _25    Clindamycin 6066mIV  _26    Clindamycin 90042mV  _27   Ampicillin 1g IV  _28   Keflex 500m58m  _29    Gentamicin __ mg IV or per pharmacy _30   Gemcitabine 2000mg32mdder instillation _31      Diflucan 100mg 88m_32    Botox injection _33  units ____   Magnesium citrate 1 bottle PO - when?___    Fleets enema - when?__   Other:__________       VTE Prophylaxis:     SCD's  _34       TED Hose  _35     Heparin 5000 units sq  _36         Other: __________

## 2020-09-19 NOTE — Telephone Encounter (Signed)
Surgery orders placed for upcoming surgery on 09/22. Faxed posting form to Pre-Admit Testing. No Clearance needed.

## 2020-09-19 NOTE — Telephone Encounter (Signed)
Pt. Called and wants to know if there is anything she can take for urgency over the counter or prescription? She  would like for someone to advise her.

## 2020-09-19 NOTE — Telephone Encounter (Signed)
Patient would like a call back to speak with someone in regards to an upcoming procedure that she is scheduled to come in and speak with someone about.

## 2020-09-19 NOTE — Progress Notes (Signed)
[facility] Posting Form   Surgery Date/Time: Date: 09/22/2020  Surgeon: Dr. 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

## 2020-09-19 NOTE — Addendum Note (Signed)
Addended by: Gerald Leitz A on: 09/19/2020 02:24 PM   Modules accepted: Orders

## 2020-09-19 NOTE — Telephone Encounter (Signed)
Per Zara Council Patient is to be scheduled for Left Ureteroscopy, Laser Lithotripsy,Ureteral stent exchange  Mrs. Hertzler  was contacted and possible surgical dates were discussed, 09/22/20 was agreed upon for surgery with Dr. Erlene Quan  Patient was directed to call [phone] between 1-3pm the day before surgery to find out surgical arrival time.  Instructions were given not to eat or drink from midnight on the night before surgery and have a driver for the day of surgery. On the surgery day patient was instructed to enter through the [place] entrance of [facility] report the Same Day Surgery desk.   Pre-Admit Testing will be in contact via phone to set up an interview with the anesthesia team to review your history and medications prior to surgery.   Reminder of this information was sent via Mychart to the patient.   Patient is not to hold anticoag's per Zara Council.

## 2020-09-19 NOTE — Telephone Encounter (Signed)
Nori Riis, PA-C  Dorris Fetch A 1 hour ago (1:32 PM)   She is likely having urinary urgency and frequency because her body is trying to expel the stone.  The best thing for her to do is to push fluids and continue to take the tamsulosin.

## 2020-09-20 ENCOUNTER — Other Ambulatory Visit: Payer: Self-pay

## 2020-09-20 ENCOUNTER — Other Ambulatory Visit
Admission: RE | Admit: 2020-09-20 | Discharge: 2020-09-20 | Disposition: A | Discharge status: Home / Self Care | Payer: BC Managed Care – PPO | Source: Ambulatory Visit | Attending: Urology | Admitting: Urology

## 2020-09-20 HISTORY — DX: Pneumonia, unspecified organism: J18.9

## 2020-09-20 NOTE — Patient Instructions (Addendum)
Your procedure is scheduled on: 09/22/20  Report to the Registration Desk on the 1st floor of the [facility]. To find out your arrival time, please call [phone] between 1PM - 3PM on: 09/21/20   REMEMBER: Instructions that are not followed completely may result in serious medical risk, up to and including death; or upon the discretion of your surgeon and anesthesiologist your surgery may need to be rescheduled.  Do not eat food or drink any fluids after midnight the night before surgery.  No gum chewing, lozengers or hard candies.   TAKE THESE MEDICATIONS THE MORNING OF SURGERY WITH A SIP OF WATER:  - tamsulosin (FLOMAX) 0.4 MG CAPS capsule     - pantoprazole (PROTONIX) 40 MG tablet , (take one the night before and one on the morning of surgery - helps to prevent nausea after surgery.)  One week prior to surgery: Stop Anti-inflammatories (NSAIDS) such as Advil, Aleve, Ibuprofen, Motrin, Naproxen, Naprosyn and Aspirin based products such as Excedrin, Goodys Powder, BC Powder.  Stop ANY OVER THE COUNTER supplements until after surgery.  You may take Tylenol if needed for pain up until the day of surgery.  No Alcohol for 24 hours before or after surgery.  No Smoking including e-cigarettes for 24 hours prior to surgery.  No chewable tobacco products for at least 6 hours prior to surgery.  No nicotine patches on the day of surgery.  Do not use any "recreational" drugs for at least a week prior to your surgery.  Please be advised that the combination of cocaine and anesthesia may have negative outcomes, up to and including death. If you test positive for cocaine, your surgery will be cancelled.  On the morning of surgery brush your teeth with toothpaste and water, you may rinse your mouth with mouthwash if you wish. Do not swallow any toothpaste or mouthwash.  Do not wear jewelry, make-up, hairpins, clips or nail polish.  Do not wear lotions, powders, or perfumes.   Do not  shave body from the neck down 48 hours prior to surgery just in case you cut yourself which could leave a site for infection.  Also, freshly shaved skin may become irritated if using the CHG soap.  Contact lenses, hearing aids and dentures may not be worn into surgery.  Do not bring valuables to the hospital. [facility] is not responsible for any missing/lost belongings or valuables.   Notify your doctor if there is any change in your medical condition (cold, fever, infection).  Wear comfortable clothing (specific to your surgery type) to the hospital.  After surgery, you can help prevent lung complications by doing breathing exercises.  Take deep breaths and cough every 1-2 hours. Your doctor may order a device called an Incentive Spirometer to help you take deep breaths. When coughing or sneezing, hold a pillow firmly against your incision with both hands. This is called "splinting." Doing this helps protect your incision. It also decreases belly discomfort.  If you are being admitted to the hospital overnight, leave your suitcase in the car. After surgery it may be brought to your room.  If you are being discharged the day of surgery, you will not be allowed to drive home. You will need a responsible adult (18 years or older) to drive you home and stay with you that night.   If you are taking public transportation, you will need to have a responsible adult (18 years or older) with you. Please confirm with your physician that  it is acceptable to use public transportation.   Please call the [place] Dept[facility] at [phone] if you have any questions about these instructions.  Surgery Visitation Policy:  Patients undergoing a surgery or procedure may have one family member or support person with them as long as that person is not COVID-19 positive or experiencing its symptoms.  That person may remain in the waiting area during the procedure and may rotate out with other  people.  Inpatient Visitation:    Visiting hours are 7 a.m. to 8 p.m. Up to two visitors ages 16+ are allowed at one time in a patient room. The visitors may rotate out with other people during the day. Visitors must check out when they leave, or other visitors will not be allowed. One designated support person may remain overnight. The visitor must pass COVID-19 screenings, use hand sanitizer when entering and exiting the patient's room and wear a mask at all times, including in the patient's room. Patients must also wear a mask when staff or their visitor are in the room. Masking is required regardless of vaccination status.

## 2020-09-21 ENCOUNTER — Other Ambulatory Visit
Admission: RE | Admit: 2020-09-21 | Discharge: 2020-09-21 | Disposition: A | Discharge status: Home / Self Care | Payer: BC Managed Care – PPO | Source: Ambulatory Visit | Attending: Urology | Admitting: Urology

## 2020-09-21 DIAGNOSIS — Z882 Allergy status to sulfonamides status: Secondary | ICD-10-CM | POA: Diagnosis not present

## 2020-09-21 DIAGNOSIS — Z887 Allergy status to serum and vaccine status: Secondary | ICD-10-CM | POA: Diagnosis not present

## 2020-09-21 DIAGNOSIS — Z01818 Encounter for other preprocedural examination: Secondary | ICD-10-CM | POA: Insufficient documentation

## 2020-09-21 DIAGNOSIS — Z888 Allergy status to other drugs, medicaments and biological substances status: Secondary | ICD-10-CM | POA: Diagnosis not present

## 2020-09-21 DIAGNOSIS — N201 Calculus of ureter: Secondary | ICD-10-CM | POA: Diagnosis present

## 2020-09-21 DIAGNOSIS — N202 Calculus of kidney with calculus of ureter: Secondary | ICD-10-CM | POA: Diagnosis not present

## 2020-09-21 DIAGNOSIS — Z79899 Other long term (current) drug therapy: Secondary | ICD-10-CM | POA: Diagnosis not present

## 2020-09-21 DIAGNOSIS — R310 Gross hematuria: Secondary | ICD-10-CM | POA: Diagnosis not present

## 2020-09-21 DIAGNOSIS — Z87442 Personal history of urinary calculi: Secondary | ICD-10-CM | POA: Diagnosis not present

## 2020-09-21 LAB — CBC
HCT: 37.2 % (ref 36.0–46.0)
Hemoglobin: 13.1 g/dL (ref 12.0–15.0)
MCH: 29.8 pg (ref 26.0–34.0)
MCHC: 35.2 g/dL (ref 30.0–36.0)
MCV: 84.7 fL (ref 80.0–100.0)
Platelets: 254 10*3/uL (ref 150–400)
RBC: 4.39 MIL/uL (ref 3.87–5.11)
RDW: 12.2 % (ref 11.5–15.5)
WBC: 9.7 10*3/uL (ref 4.0–10.5)
nRBC: 0 % (ref 0.0–0.2)

## 2020-09-22 ENCOUNTER — Ambulatory Visit: Payer: BC Managed Care – PPO | Admitting: Urgent Care

## 2020-09-22 ENCOUNTER — Ambulatory Visit: Payer: BC Managed Care – PPO

## 2020-09-22 ENCOUNTER — Ambulatory Visit
Admission: RE | Admit: 2020-09-22 | Discharge: 2020-09-22 | Disposition: A | Discharge status: Home / Self Care | Payer: BC Managed Care – PPO | Attending: Urology | Admitting: Urology

## 2020-09-22 ENCOUNTER — Encounter
Admission: RE | Disposition: A | Discharge status: Home / Self Care | Payer: Self-pay | Source: Home / Self Care | Attending: Urology

## 2020-09-22 ENCOUNTER — Encounter: Payer: Self-pay | Admitting: Urology

## 2020-09-22 ENCOUNTER — Other Ambulatory Visit: Payer: Self-pay

## 2020-09-22 DIAGNOSIS — N201 Calculus of ureter: Secondary | ICD-10-CM

## 2020-09-22 DIAGNOSIS — N202 Calculus of kidney with calculus of ureter: Secondary | ICD-10-CM | POA: Insufficient documentation

## 2020-09-22 DIAGNOSIS — Z888 Allergy status to other drugs, medicaments and biological substances status: Secondary | ICD-10-CM | POA: Insufficient documentation

## 2020-09-22 DIAGNOSIS — Z87442 Personal history of urinary calculi: Secondary | ICD-10-CM | POA: Insufficient documentation

## 2020-09-22 DIAGNOSIS — Z882 Allergy status to sulfonamides status: Secondary | ICD-10-CM | POA: Insufficient documentation

## 2020-09-22 DIAGNOSIS — Z887 Allergy status to serum and vaccine status: Secondary | ICD-10-CM | POA: Insufficient documentation

## 2020-09-22 DIAGNOSIS — Z79899 Other long term (current) drug therapy: Secondary | ICD-10-CM | POA: Insufficient documentation

## 2020-09-22 DIAGNOSIS — R310 Gross hematuria: Secondary | ICD-10-CM | POA: Insufficient documentation

## 2020-09-22 HISTORY — PX: CYSTOSCOPY/URETEROSCOPY/HOLMIUM LASER/STENT PLACEMENT: SHX6546

## 2020-09-22 LAB — GLUCOSE, CAPILLARY
Glucose-Capillary: 115 mg/dL — ABNORMAL HIGH (ref 70–99)
Glucose-Capillary: 111 mg/dL — ABNORMAL HIGH (ref 70–99)

## 2020-09-22 SURGERY — CYSTOSCOPY/URETEROSCOPY/HOLMIUM LASER/STENT PLACEMENT
Anesthesia: General | Laterality: Left

## 2020-09-22 MED ORDER — IOHEXOL 180 MG/ML  SOLN
INTRAMUSCULAR | Status: DC | PRN
  Administered 2020-09-22: 5 mL

## 2020-09-22 MED ORDER — SODIUM CHLORIDE 0.9 % IV SOLN: INTRAVENOUS | Status: DC

## 2020-09-22 MED ORDER — PROPOFOL 10 MG/ML IV BOLUS
INTRAVENOUS | Status: AC
  Filled 2020-09-22: qty 20

## 2020-09-22 MED ORDER — PROPOFOL 10 MG/ML IV BOLUS
INTRAVENOUS | Status: DC | PRN
Start: 2020-09-22 — End: 2020-09-22
  Administered 2020-09-22: 150 mg via INTRAVENOUS

## 2020-09-22 MED ORDER — ACETAMINOPHEN 10 MG/ML IV SOLN: 1000.0000 mg | Freq: Once | INTRAVENOUS | Status: DC | PRN

## 2020-09-22 MED ORDER — CHLORHEXIDINE GLUCONATE 0.12 % MT SOLN
OROMUCOSAL | Status: AC
  Filled 2020-09-22: qty 15

## 2020-09-22 MED ORDER — CEFAZOLIN SODIUM-DEXTROSE 2-4 GM/100ML-% IV SOLN
2.0000 g | INTRAVENOUS | Status: AC
  Administered 2020-09-22: 2 g via INTRAVENOUS

## 2020-09-22 MED ORDER — CEFAZOLIN SODIUM-DEXTROSE 2-4 GM/100ML-% IV SOLN
INTRAVENOUS | Status: AC
  Filled 2020-09-22: qty 100

## 2020-09-22 MED ORDER — ONDANSETRON HCL 4 MG/2ML IJ SOLN
INTRAMUSCULAR | Status: DC | PRN
  Administered 2020-09-22: 4 mg via INTRAVENOUS

## 2020-09-22 MED ORDER — LIDOCAINE HCL (PF) 2 % IJ SOLN
INTRAMUSCULAR | Status: AC
  Filled 2020-09-22: qty 5

## 2020-09-22 MED ORDER — OXYCODONE HCL 5 MG PO TABS: 5.0000 mg | ORAL_TABLET | Freq: Once | ORAL | Status: DC | PRN

## 2020-09-22 MED ORDER — ORAL CARE MOUTH RINSE: 15.0000 mL | Freq: Once | OROMUCOSAL | Status: DC

## 2020-09-22 MED ORDER — MIDAZOLAM HCL 2 MG/2ML IJ SOLN
INTRAMUSCULAR | Status: AC
  Filled 2020-09-22: qty 2

## 2020-09-22 MED ORDER — FENTANYL CITRATE (PF) 100 MCG/2ML IJ SOLN: 25.0000 ug | INTRAMUSCULAR | Status: DC | PRN

## 2020-09-22 MED ORDER — ACETAMINOPHEN 10 MG/ML IV SOLN
INTRAVENOUS | Status: AC
  Filled 2020-09-22: qty 100

## 2020-09-22 MED ORDER — PROMETHAZINE HCL 25 MG/ML IJ SOLN: 6.2500 mg | INTRAMUSCULAR | Status: DC | PRN

## 2020-09-22 MED ORDER — ACETAMINOPHEN 10 MG/ML IV SOLN
INTRAVENOUS | Status: DC | PRN
  Administered 2020-09-22: 1000 mg via INTRAVENOUS

## 2020-09-22 MED ORDER — LIDOCAINE HCL (CARDIAC) PF 100 MG/5ML IV SOSY
PREFILLED_SYRINGE | INTRAVENOUS | Status: DC | PRN
  Administered 2020-09-22: 100 mg via INTRAVENOUS

## 2020-09-22 MED ORDER — ONDANSETRON HCL 4 MG/2ML IJ SOLN
INTRAMUSCULAR | Status: AC
  Filled 2020-09-22: qty 2

## 2020-09-22 MED ORDER — OXYBUTYNIN CHLORIDE 5 MG PO TABS: 5.0000 mg | ORAL_TABLET | Freq: Three times a day (TID) | ORAL | 0 refills | Status: DC | PRN

## 2020-09-22 MED ORDER — FENTANYL CITRATE (PF) 100 MCG/2ML IJ SOLN
INTRAMUSCULAR | Status: DC | PRN
  Administered 2020-09-22 (×2): 50 ug via INTRAVENOUS

## 2020-09-22 MED ORDER — FENTANYL CITRATE (PF) 100 MCG/2ML IJ SOLN
INTRAMUSCULAR | Status: AC
  Filled 2020-09-22: qty 2

## 2020-09-22 MED ORDER — SODIUM CHLORIDE 0.9 % IR SOLN
Status: DC | PRN
  Administered 2020-09-22: 900 mL

## 2020-09-22 MED ORDER — DEXAMETHASONE SODIUM PHOSPHATE 10 MG/ML IJ SOLN
INTRAMUSCULAR | Status: DC | PRN
  Administered 2020-09-22: 10 mg via INTRAVENOUS

## 2020-09-22 MED ORDER — GLYCOPYRROLATE 0.2 MG/ML IJ SOLN
INTRAMUSCULAR | Status: DC | PRN
  Administered 2020-09-22: .2 mg via INTRAVENOUS

## 2020-09-22 MED ORDER — CHLORHEXIDINE GLUCONATE 0.12 % MT SOLN: 15.0000 mL | Freq: Once | OROMUCOSAL | Status: DC

## 2020-09-22 MED ORDER — OXYCODONE HCL 5 MG/5ML PO SOLN: 5.0000 mg | Freq: Once | ORAL | Status: DC | PRN

## 2020-09-22 MED ORDER — MIDAZOLAM HCL 2 MG/2ML IJ SOLN
INTRAMUSCULAR | Status: DC | PRN
  Administered 2020-09-22: 2 mg via INTRAVENOUS

## 2020-09-22 MED ORDER — HYDROCODONE-ACETAMINOPHEN 5-325 MG PO TABS: 1.0000 | ORAL_TABLET | Freq: Four times a day (QID) | ORAL | 0 refills | Status: DC | PRN

## 2020-09-22 MED ORDER — ROCURONIUM BROMIDE 10 MG/ML (PF) SYRINGE
PREFILLED_SYRINGE | INTRAVENOUS | Status: AC
  Filled 2020-09-22: qty 10

## 2020-09-22 MED ORDER — DEXAMETHASONE SODIUM PHOSPHATE 10 MG/ML IJ SOLN
INTRAMUSCULAR | Status: AC
  Filled 2020-09-22: qty 1

## 2020-09-22 SURGICAL SUPPLY — 33 items
ADH LQ OCL WTPRF AMP STRL LF (MISCELLANEOUS)
ADHESIVE MASTISOL STRL (MISCELLANEOUS) IMPLANT
BAG DRAIN CYSTO-URO LG1000N (MISCELLANEOUS) ×2 IMPLANT
BASKET ZERO TIP 1.9FR (BASKET) IMPLANT
BRUSH SCRUB EZ 1% IODOPHOR (MISCELLANEOUS) ×2 IMPLANT
BSKT STON RTRVL ZERO TP 1.9FR (BASKET)
CATH URET FLEX-TIP 2 LUMEN 10F (CATHETERS) IMPLANT
CATH URETL OPEN 5X70 (CATHETERS) ×2 IMPLANT
CNTNR SPEC 2.5X3XGRAD LEK (MISCELLANEOUS)
CONT SPEC 4OZ STER OR WHT (MISCELLANEOUS)
CONT SPEC 4OZ STRL OR WHT (MISCELLANEOUS)
CONTAINER SPEC 2.5X3XGRAD LEK (MISCELLANEOUS) IMPLANT
DRAPE UTILITY 15X26 TOWEL STRL (DRAPES) ×2 IMPLANT
DRSG TEGADERM 2-3/8X2-3/4 SM (GAUZE/BANDAGES/DRESSINGS) IMPLANT
GAUZE 4X4 16PLY ~~LOC~~+RFID DBL (SPONGE) ×2 IMPLANT
GLOVE SURG ENC MOIS LTX SZ6.5 (GLOVE) ×2 IMPLANT
GOWN STRL REUS W/ TWL LRG LVL3 (GOWN DISPOSABLE) ×2 IMPLANT
GOWN STRL REUS W/TWL LRG LVL3 (GOWN DISPOSABLE) ×4
GUIDEWIRE GREEN .038 145CM (MISCELLANEOUS) IMPLANT
GUIDEWIRE STR DUAL SENSOR (WIRE) ×4 IMPLANT
INFUSOR MANOMETER BAG 3000ML (MISCELLANEOUS) ×2 IMPLANT
IV NS IRRIG 3000ML ARTHROMATIC (IV SOLUTION) ×2 IMPLANT
KIT TURNOVER CYSTO (KITS) ×2 IMPLANT
MANIFOLD NEPTUNE II (INSTRUMENTS) IMPLANT
PACK CYSTO AR (MISCELLANEOUS) ×2 IMPLANT
SET CYSTO W/LG BORE CLAMP LF (SET/KITS/TRAYS/PACK) ×2 IMPLANT
SHEATH URETERAL 12FRX35CM (MISCELLANEOUS) IMPLANT
STENT URET 6FRX24 CONTOUR (STENTS) ×2 IMPLANT
STENT URET 6FRX26 CONTOUR (STENTS) IMPLANT
SURGILUBE 2OZ TUBE FLIPTOP (MISCELLANEOUS) ×2 IMPLANT
TRACTIP FLEXIVA PULSE ID 200 (Laser) ×2 IMPLANT
WATER STERILE IRR 1000ML POUR (IV SOLUTION) ×2 IMPLANT
WATER STERILE IRR 500ML POUR (IV SOLUTION) ×2 IMPLANT

## 2020-09-22 NOTE — Interval H&P Note (Signed)
H&P up-to-date  No changes  Regular rate and rhythm Clear to auscultation bilaterally

## 2020-09-22 NOTE — Anesthesia Preprocedure Evaluation (Addendum)
Anesthesia Evaluation  Patient identified by MRN, date of birth, ID band Patient awake    Reviewed: Allergy & Precautions, H&P , NPO status , Patient's Chart, lab work & pertinent test results  History of Anesthesia Complications (+) DIFFICULT AIRWAY and history of anesthetic complications  Airway Mallampati: III  TM Distance: >3 FB Neck ROM: Full    Dental  (+) Missing,    Pulmonary neg pulmonary ROS,    Pulmonary exam normal breath sounds clear to auscultation       Cardiovascular negative cardio ROS Normal cardiovascular exam Rhythm:Regular Rate:Normal     Neuro/Psych negative neurological ROS  negative psych ROS   GI/Hepatic negative GI ROS, Neg liver ROS, GERD  Medicated and Controlled,  Endo/Other  negative endocrine ROSdiabetes, Type 2, Oral Hypoglycemic Agents  Renal/GU Left UVJ Stone  negative genitourinary   Musculoskeletal  (+) Arthritis ,   Abdominal Normal abdominal exam  (+)   Peds negative pediatric ROS (+)  Hematology negative hematology ROS (+)   Anesthesia Other Findings Past Medical History: No date: Arthritis No date: Cancer (HCC)     Comment:  skin ca on face/CERVICAL EARYLY 20'S No date: Diabetes mellitus without complication (HCC) No date: Difficult intubation No date: Endometriosis determined by laparoscopy No date: GERD (gastroesophageal reflux disease) No date: History of kidney stones     Comment:  H/O 2007: Restless leg syndrome   Reproductive/Obstetrics negative OB ROS                            Anesthesia Physical  Anesthesia Plan  ASA: 2  Anesthesia Plan: General   Post-op Pain Management: GA combined w/ Regional for post-op pain   Induction:   PONV Risk Score and Plan: 3 and Ondansetron, Dexamethasone and Treatment may vary due to age or medical condition  Airway Management Planned: LMA  Additional Equipment:   Intra-op Plan:    Post-operative Plan: Extubation in OR  Informed Consent: I have reviewed the patients History and Physical, chart, labs and discussed the procedure including the risks, benefits and alternatives for the proposed anesthesia with the patient or authorized representative who has indicated his/her understanding and acceptance.     Dental advisory given  Plan Discussed with: CRNA, Anesthesiologist and Surgeon  Anesthesia Plan Comments:      Anesthesia Quick Evaluation

## 2020-09-22 NOTE — Transfer of Care (Signed)
Immediate Anesthesia Transfer of Care Note  Patient: Martha Whitney  Procedure(s) Performed: CYSTOSCOPY/URETEROSCOPY/HOLMIUM LASER/STENT PLACEMENT (Left)  Patient Location: PACU  Anesthesia Type:General  Level of Consciousness: sedated  Airway & Oxygen Therapy: Patient Spontanous Breathing and Patient connected to face mask oxygen  Post-op Assessment: Report given to RN and Post -op Vital signs reviewed and stable  Post vital signs: Reviewed and stable  Last Vitals:  Vitals Value Taken Time  BP 98/59 09/22/20 1359  Temp    Pulse 75 09/22/20 1359  Resp 11 09/22/20 1359  SpO2 98 % 09/22/20 1359    Last Pain:  Vitals:   09/22/20 1105  TempSrc: Temporal  PainSc: 0-No pain         Complications: No notable events documented.

## 2020-09-22 NOTE — Anesthesia Procedure Notes (Signed)
Procedure Name: LMA Insertion Date/Time: 09/22/2020 1:22 PM Performed by: Doreen Salvage, CRNA Pre-anesthesia Checklist: Patient identified, Patient being monitored, Timeout performed, Emergency Drugs available and Suction available Patient Re-evaluated:Patient Re-evaluated prior to induction Oxygen Delivery Method: Circle system utilized Preoxygenation: Pre-oxygenation with 100% oxygen Induction Type: IV induction Ventilation: Mask ventilation without difficulty LMA: LMA inserted LMA Size: 3.0 Tube type: Oral Number of attempts: 1 Placement Confirmation: positive ETCO2 and breath sounds checked- equal and bilateral Tube secured with: Tape Dental Injury: Teeth and Oropharynx as per pre-operative assessment

## 2020-09-22 NOTE — Discharge Instructions (Addendum)
You have a ureteral stent in place.  This is a tube that extends from your kidney to your bladder.  This may cause urinary bleeding, burning with urination, and urinary frequency.  Please call our office or present to the ED if you develop fevers >101 or pain which is not able to be controlled with oral pain medications.  You may be given either Flomax and/ or ditropan to help with bladder spasms and stent pain in addition to pain medications.    [facility] [address] [place], [place] [address] [phone]     AMBULATORY SURGERY  DISCHARGE INSTRUCTIONS   The drugs that you were given will stay in your system until tomorrow so for the next 24 hours you should not:  Drive an automobile Make any legal decisions Drink any alcoholic beverage   You may resume regular meals tomorrow.  Today it is better to start with liquids and gradually work up to solid foods.  You may eat anything you prefer, but it is better to start with liquids, then soup and crackers, and gradually work up to solid foods.   Please notify your doctor immediately if you have any unusual bleeding, trouble breathing, redness and pain at the surgery site, drainage, fever, or pain not relieved by medication.     Your post-operative visit with Dr.                                       is: Date:                        Time:    Please call to schedule your post-operative visit.  Additional Instructions:

## 2020-09-22 NOTE — Op Note (Signed)
Date of procedure: 09/22/20  Preoperative diagnosis:  Left distal ureteral calculus centimeters  Postoperative diagnosis:  Same  Procedure: Left ureteroscopy laser lithotripsy Left ureteral stent placement Left retrograde pyelogram Fluoroscopy less than 30 minutes  Surgeon: Hollice Espy, MD  Anesthesia: General  Complications: None  Intraoperative findings: 6 mm left distal ureteral calculus proximal and 1 cm proximal to the left UVJ which was impacted.  Stone fragmented and stent placed.  EBL: Minimal  Specimens: None  Drains: 6 x 24 French double-J ureteral stent on left  Indication: Martha Whitney is a 55 y.o. patient with 6 mm left retained distal ureteral fragment status post ESWL who is failed to pass it spontaneously.  After reviewing the management options for treatment, she elected to proceed with the above surgical procedure(s). We have discussed the potential benefits and risks of the procedure, side effects of the proposed treatment, the likelihood of the patient achieving the goals of the procedure, and any potential problems that might occur during the procedure or recuperation. Informed consent has been obtained.  Description of procedure:  The patient was taken to the operating room and general anesthesia was induced.  The patient was placed in the dorsal lithotomy position, prepped and draped in the usual sterile fashion, and preoperative antibiotics were administered. A preoperative time-out was performed.   A 21 French cystoscope was [place] per urethra into the bladder.  On scout imaging, the stone in question could be seen in the distal ureter.  The UO was then intubated using a 5 [place] open-ended ureteral catheter and a gentle retrograde pyelogram was performed.  There is a small filling defect at the level of the stone on 10 proximal ureteral dilation without overt hydronephrosis.  The wire was then placed up to the level of the kidney initially meeting  some resistance at the stone having to manipulate the wire somewhat to get around the stone.  I then advanced a 4.5 French semirigid ureteroscope up to the level of the stone had to use a second wire as a railroad technique to get there.  Upon encountering the stone, is noted to be partially embedded into the medial wall of the ureter.  A 200 m fiber was used with dusting settings of 0.3 J and 60 Hz to fragment the stone to very tiny pieces.  I chases few very small fragments approximately but was also minimally able to pulverize the stone such that there was minimal significant residual fragments remaining.  These were so small they did not appear amenable to basket extraction.  A final retrograde through the scope revealed no contrast extravasation.  There remove the scope and placed a 6 x 24 French double-J ureteral stent over the wire such that upon removal of the wire, there is a curl in the renal pelvis as well as within the bladder.  The bladder is then drained.  Patient was then cleaned and dried, repositioned supine position, reversed from anesthesia, and taken the PACU in stable condition.  Plan: Given the impaction with ureteral edema and a small mucosal abrasion with a stone had been embedded, elected to place a stent without a string.  We will remove it next Wednesday in the office cystoscopically.  Hollice Espy, M.D.

## 2020-09-23 ENCOUNTER — Encounter: Payer: Self-pay | Admitting: Urology

## 2020-09-23 NOTE — Anesthesia Postprocedure Evaluation (Signed)
Anesthesia Post Note  Patient: Martha Whitney  Procedure(s) Performed: CYSTOSCOPY/URETEROSCOPY/HOLMIUM LASER/STENT PLACEMENT (Left)  Patient location during evaluation: PACU Anesthesia Type: General Level of consciousness: awake and alert Pain management: pain level controlled Vital Signs Assessment: post-procedure vital signs reviewed and stable Respiratory status: spontaneous breathing, nonlabored ventilation and respiratory function stable Cardiovascular status: blood pressure returned to baseline and stable Postop Assessment: no apparent nausea or vomiting Anesthetic complications: no   No notable events documented.   Last Vitals:  Vitals:   09/22/20 1430 09/22/20 1439  BP:    Pulse: 92 81  Resp: 12 14  Temp:  (!) 36.1 C  SpO2: 94% 96%    Last Pain:  Vitals:   09/22/20 1439  TempSrc:   PainSc: 0-No pain                 [place] Ouch

## 2020-09-27 ENCOUNTER — Ambulatory Visit: Payer: BC Managed Care – PPO | Admitting: Urology

## 2020-09-27 NOTE — Progress Notes (Signed)
   09/28/20  CC:  Chief Complaint  Patient presents with   Cysto Stent Removal     HPI: Martha Whitney is a 55 y.o.female with a personal history of nephrolithiasis , who presents today for stent removal.   She has a  stone composition of 70% calcium oxalate monohydrate and 30% calcium oxalate dihydrate  She is s/p Left ESWL in 07/2020  for 7 mm left renal pelvic stone  with Dr. Diamantina Providence.  She was found to have a fragment in her left UVJ on follow up KUB.     She is s/p ureteroscopy on 09/22/2020. Intraoperative findings consistent with 6 mm left distal ureteral calculus proximal and 1 cm proximal to the left UVJ which was impacted.  Stone fragmented and stent placed. EBL minimal. Given the impaction with ureteral edema and a small mucosal abrasion with a stone had been embedded,stent was placed without a string.    Urinalysis nitrite positive today, micropsic urine showed no WBCs and no bacteria so suspect contamination. Urine was sent for a culture.  She was given an ceftriaxone today.   NED. A&Ox3.   No respiratory distress   Abd soft, NT, ND Normal external genitalia with patent urethral meatus    Cystoscopy/ Stent removal procedure  Patient identification was confirmed, informed consent was obtained, and patient was prepped using Betadine solution.  Lidocaine jelly was administered per urethral meatus.    Preoperative abx where received prior to procedure.    Procedure: - Flexible cystoscope introduced, without any difficulty.   - Thorough search of the bladder revealed:    normal urethral meatus  Stent seen emanating from  left  ureteral orifice, grasped with stent graspers, and removed in entirety.     Post-Procedure: - Patient tolerated the procedure well  Assessment and Plan  Left kidney stone  - Precautions reviewed today  - Will follow up with [facility]    I,Kailey Littlejohn,acting as a scribe for Hollice Espy, MD.,have documented all relevant  documentation on the behalf of Hollice Espy, MD,as directed by  Hollice Espy, MD while in the presence of Hollice Espy, MD.  You have a ureteral stent in place.  This is a tube that extends from your kidney to your bladder.  This may cause urinary bleeding, burning with urination, and urinary frequency.  Please call our office or present to the ED if you develop fevers >101 or pain which is not able to be controlled with oral pain medications.  You may be given either Flomax and/ or ditropan to help with bladder spasms and stent pain in addition to pain medications.    [facility] [address], [place], [place] [address] [phone]

## 2020-09-28 ENCOUNTER — Encounter: Payer: Self-pay | Admitting: Urology

## 2020-09-28 ENCOUNTER — Other Ambulatory Visit: Payer: Self-pay

## 2020-09-28 ENCOUNTER — Ambulatory Visit (INDEPENDENT_AMBULATORY_CARE_PROVIDER_SITE_OTHER): Payer: BC Managed Care – PPO | Admitting: Urology

## 2020-09-28 VITALS — BP 110/73 | HR 90 | Ht 63.0 in | Wt 164.0 lb

## 2020-09-28 DIAGNOSIS — N200 Calculus of kidney: Secondary | ICD-10-CM

## 2020-09-28 MED ORDER — CEFTRIAXONE SODIUM 1 G IJ SOLR
1.0000 g | Freq: Once | INTRAMUSCULAR | Status: AC
  Administered 2020-09-28: 1 g via INTRAMUSCULAR

## 2020-09-29 LAB — URINALYSIS, COMPLETE
Bilirubin, UA: NEGATIVE
Nitrite, UA: POSITIVE — AB
Specific Gravity, UA: 1.025 (ref 1.005–1.030)
Urobilinogen, Ur: 1 mg/dL (ref 0.2–1.0)
pH, UA: 6 (ref 5.0–7.5)

## 2020-09-29 LAB — MICROSCOPIC EXAMINATION: RBC, Urine: >30 /hpf — AB (ref 0–2)

## 2020-10-04 LAB — CULTURE, URINE COMPREHENSIVE

## 2020-10-14 ENCOUNTER — Other Ambulatory Visit: Payer: Self-pay | Admitting: Urology

## 2020-10-14 DIAGNOSIS — N200 Calculus of kidney: Secondary | ICD-10-CM

## 2020-10-14 DIAGNOSIS — R3129 Other microscopic hematuria: Secondary | ICD-10-CM

## 2020-10-19 ENCOUNTER — Ambulatory Visit
Admission: RE | Admit: 2020-10-19 | Discharge: 2020-10-19 | Disposition: A | Discharge status: Home / Self Care | Payer: BC Managed Care – PPO | Source: Ambulatory Visit | Attending: Urology | Admitting: Urology

## 2020-10-19 ENCOUNTER — Other Ambulatory Visit: Payer: Self-pay

## 2020-10-19 DIAGNOSIS — N200 Calculus of kidney: Secondary | ICD-10-CM

## 2020-10-25 NOTE — Progress Notes (Signed)
10/25/20 6:30 PM   Martha Whitney Aug 21, 1965 [id number]  Referring provider:  Kirk Ruths, MD [facility] [place],  [place] [address] No chief complaint on file.    HPI: Martha Whitney is a 55 y.o.female with a personal history of nephrolithiasis, who presents today for 4 week follow-up with RUS.   She has a stone composition of 70% calcium oxalate monohydrate and 30% calcium oxalate dihydrate   She is s/p Left ESWL in 07/2020  for 7 mm left renal pelvic stone  with Dr. Diamantina Providence.  She was found to have a fragment in her left UVJ on follow up KUB.     She is s/p ureteroscopy on 09/22/2020.  10/20/2020 RUS revealed unremarkable.   Today, she is doing well.  She does admit that she does not drink enough water at baseline.    PMH: Past Medical History:  Diagnosis Date   Arthritis    Cancer ([place])    skin ca on face/CERVICAL EARYLY 20'S   Diabetes mellitus without complication ([place])    Difficult intubation    Endometriosis determined by laparoscopy    GERD (gastroesophageal reflux disease)    History of kidney stones    H/O   Pneumonia    Restless leg syndrome 2007    Surgical History: Past Surgical History:  Procedure Laterality Date   ABDOMINAL HYSTERECTOMY     CARPAL TUNNEL RELEASE Left 2003   CHOLECYSTECTOMY     COLONOSCOPY     CYSTECTOMY N/A 1969   cyst removed from throat   CYSTOSCOPY/URETEROSCOPY/HOLMIUM LASER/STENT PLACEMENT Left 09/22/2020   Procedure: CYSTOSCOPY/URETEROSCOPY/HOLMIUM LASER/STENT PLACEMENT;  Surgeon: Hollice Espy, MD;  Location: [facility];  Service: Urology;  Laterality: Left;   EXTRACORPOREAL SHOCK WAVE LITHOTRIPSY Left 07/28/2020   Procedure: EXTRACORPOREAL SHOCK WAVE LITHOTRIPSY (ESWL);  Surgeon: Billey Co, MD;  Location: [facility];  Service: Urology;  Laterality: Left;   GANGLION CYST EXCISION Left 10/21/2014   Procedure: REMOVAL GANGLION OF WRIST;  Surgeon: Hessie Knows, MD;   Location: [facility];  Service: Orthopedics;  Laterality: Left;   LAPAROSCOPIC SALPINGO OOPHERECTOMY Bilateral 06/22/2014   Procedure: LAPAROSCOPIC SALPINGO OOPHORECTOMY;  Surgeon: Aletha Halim, MD;  Location: [facility];  Service: Gynecology;  Laterality: Bilateral;   LAPAROSCOPIC TOTAL HYSTERECTOMY     LEEP N/A 1995   MULTIPLE TOOTH EXTRACTIONS     SHOULDER ARTHROSCOPY WITH OPEN ROTATOR CUFF REPAIR Right 07/09/2017   Procedure: SHOULDER ARTHROSCOPY WITH OPEN ROTATOR CUFF REPAIR;  Surgeon: Corky Mull, MD;  Location: [facility];  Service: Orthopedics;  Laterality: Right;  extensive debridement, decompression, miniopen biceps tenodesis, mini open rotator cuff repair   TONSILLECTOMY     TOTAL SHOULDER ARTHROPLASTY Right 02/18/2020   Procedure: TOTAL SHOULDER ARTHROPLASTY;  Surgeon: Corky Mull, MD;  Location: [facility];  Service: Orthopedics;  Laterality: Right;    Home Medications:  Allergies as of 10/26/2020       Reactions   Barbital Hives   Influenza Virus Vaccine Hives   Sulfa Antibiotics Hives   Other Itching, Other (See Comments)   Surgical Glue; Skin Irritation   Pneumococcal Vaccines Swelling   Tape Rash        Medication List        Accurate as of October 25, 2020  6:30 PM. If you have any questions, ask your nurse or doctor.          gabapentin 400 MG capsule Commonly known as: NEURONTIN Take 400  mg by mouth 2 (two) times daily as needed (for restless legs).   HYDROcodone-acetaminophen 5-325 MG tablet Commonly known as: NORCO/VICODIN Take 1-2 tablets by mouth every 6 (six) hours as needed for moderate pain.   ondansetron 4 MG tablet Commonly known as: Zofran Take 1 tablet (4 mg total) by mouth every 8 (eight) hours as needed for nausea or vomiting.   oxybutynin 5 MG tablet Commonly known as: DITROPAN Take 1 tablet (5 mg total) by mouth every 8 (eight) hours as needed for bladder spasms.   pantoprazole 40 MG tablet Commonly known as: PROTONIX Take 40  mg by mouth daily.   phentermine 37.5 MG tablet Commonly known as: ADIPEX-P Take 37.5 mg by mouth every morning.   pravastatin 20 MG tablet Commonly known as: PRAVACHOL Take 20 mg by mouth every evening.   Rybelsus 7 MG Tabs Generic drug: Semaglutide Take 7 mg by mouth every morning.   tamsulosin 0.4 MG Caps capsule Commonly known as: FLOMAX TAKE 1 CAPSULE BY MOUTH EVERY DAY   traZODone 50 MG tablet Commonly known as: DESYREL Take 50-200 mg by mouth at bedtime.        Allergies:  Allergies  Allergen Reactions   Barbital Hives   Influenza Virus Vaccine Hives   Sulfa Antibiotics Hives   Other Itching and Other (See Comments)    Surgical Glue; Skin Irritation   Pneumococcal Vaccines Swelling   Tape Rash    Family History: Family History  Problem Relation Age of Onset   Heart disease Father    Heart disease Maternal Grandmother    Heart disease Paternal Grandmother    Breast cancer Neg Hx     Social History:  reports that she has never smoked. She has never used smokeless tobacco. She reports that she does not currently use alcohol. She reports that she does not use drugs.   Physical Exam: There were no vitals taken for this visit.  Constitutional:  Alert and oriented, No acute distress. HEENT: [place] AT, moist mucus membranes.  Trachea midline, no masses. Cardiovascular: No clubbing, cyanosis, or edema. Respiratory: Normal respiratory effort, no increased work of breathing. Skin: No rashes, bruises or suspicious lesions. Neurologic: Grossly intact, no focal deficits, moving all 4 extremities. Psychiatric: Normal mood and affect.  Laboratory Data:  Lab Results  Component Value Date   CREATININE 0.92 06/22/2020     Lab Results  Component Value Date   HGBA1C 5.7 05/22/2015    Urinalysis   Pertinent Imaging: CLINICAL DATA:  Renal calculi   EXAM: RENAL / URINARY TRACT ULTRASOUND COMPLETE   COMPARISON:  09/22/2020, 09/12/2020   FINDINGS: Right  Kidney:   Renal measurements: 10.6 x 4.1 x 5.0 cm = volume: 115 mL. Echogenicity within normal limits. No mass or hydronephrosis visualized.   Left Kidney:   Renal measurements: 10.4 x 4.9 x 6.2 cm = volume: 165 mL. Echogenicity within normal limits. No mass or hydronephrosis visualized.   Bladder:   Minimally distended limiting evaluation.   Other:   None.   IMPRESSION: 1. Unremarkable renal ultrasound.     Electronically Signed   By: Randa Ngo M.D.   On: 10/20/2020 21:28  Renal ultrasound images personally reviewed.  Agree with radiologic interpretation.  Assessment & Plan:    History of kidney stones - s/p ESWL followed by ureteroscopy  -Renal ultrasound today is reassuring, no evidence of hydronephrosis or residual stone burden  -Stone analysis reviewed  -Given infrequent stone events, no indication for surveillance or further  testing at this time  - We discussed general stone prevention techniques including drinking plenty water with goal of producing 2.5 L urine daily, increased citric acid intake, avoidance of high oxalate containing foods, and decreased salt intake.  Information about dietary recommendations given today.    Follow-up as needed  I,Kailey Littlejohn,acting as a scribe for Hollice Espy, MD.,have documented all relevant documentation on the behalf of Hollice Espy, MD,as directed by  Hollice Espy, MD while in the presence of Hollice Espy, MD.  I have reviewed the above documentation for accuracy and completeness, and I agree with the above.   Hollice Espy, MD  [facility] [address] [place], [place] [address] [phone]

## 2020-10-26 ENCOUNTER — Other Ambulatory Visit: Payer: Self-pay

## 2020-10-26 ENCOUNTER — Ambulatory Visit (INDEPENDENT_AMBULATORY_CARE_PROVIDER_SITE_OTHER): Payer: BC Managed Care – PPO | Admitting: Urology

## 2020-10-26 VITALS — BP 111/70 | HR 98 | Ht 63.0 in | Wt 164.0 lb

## 2020-10-26 DIAGNOSIS — N200 Calculus of kidney: Secondary | ICD-10-CM

## 2021-07-07 ENCOUNTER — Other Ambulatory Visit: Payer: Self-pay | Admitting: Internal Medicine

## 2021-07-07 DIAGNOSIS — Z1231 Encounter for screening mammogram for malignant neoplasm of breast: Secondary | ICD-10-CM

## 2021-08-08 ENCOUNTER — Ambulatory Visit
Admission: RE | Admit: 2021-08-08 | Discharge: 2021-08-08 | Disposition: A | Discharge status: Home / Self Care | Payer: BC Managed Care – PPO | Source: Ambulatory Visit | Attending: Internal Medicine | Admitting: Internal Medicine

## 2021-08-08 DIAGNOSIS — Z1231 Encounter for screening mammogram for malignant neoplasm of breast: Secondary | ICD-10-CM | POA: Diagnosis present

## 2022-01-19 ENCOUNTER — Emergency Department: Payer: BC Managed Care – PPO

## 2022-01-19 ENCOUNTER — Other Ambulatory Visit: Payer: Self-pay

## 2022-01-19 DIAGNOSIS — E119 Type 2 diabetes mellitus without complications: Secondary | ICD-10-CM | POA: Insufficient documentation

## 2022-01-19 DIAGNOSIS — E876 Hypokalemia: Secondary | ICD-10-CM | POA: Insufficient documentation

## 2022-01-19 DIAGNOSIS — R0789 Other chest pain: Secondary | ICD-10-CM | POA: Diagnosis not present

## 2022-01-19 DIAGNOSIS — R519 Headache, unspecified: Secondary | ICD-10-CM | POA: Diagnosis present

## 2022-01-19 LAB — BASIC METABOLIC PANEL
Anion gap: 10 (ref 5–15)
BUN: 11 mg/dL (ref 6–20)
CO2: 24 mmol/L (ref 22–32)
Calcium: 8.4 mg/dL — ABNORMAL LOW (ref 8.9–10.3)
Chloride: 102 mmol/L (ref 98–111)
Creatinine, Ser: 0.9 mg/dL (ref 0.44–1.00)
GFR, Estimated: >60 mL/min (ref >60)
Glucose, Bld: 136 mg/dL — ABNORMAL HIGH (ref 70–99)
Potassium: 3 mmol/L — ABNORMAL LOW (ref 3.5–5.1)
Sodium: 136 mmol/L (ref 135–145)

## 2022-01-19 LAB — CBC
HCT: 40.6 % (ref 36.0–46.0)
Hemoglobin: 13.7 g/dL (ref 12.0–15.0)
MCH: 28.7 pg (ref 26.0–34.0)
MCHC: 33.7 g/dL (ref 30.0–36.0)
MCV: 85.1 fL (ref 80.0–100.0)
Platelets: 217 10*3/uL (ref 150–400)
RBC: 4.77 MIL/uL (ref 3.87–5.11)
RDW: 12.8 % (ref 11.5–15.5)
WBC: 8.7 10*3/uL (ref 4.0–10.5)
nRBC: 0 % (ref 0.0–0.2)

## 2022-01-19 LAB — CBG MONITORING, ED: Glucose-Capillary: 133 mg/dL — ABNORMAL HIGH (ref 70–99)

## 2022-01-19 LAB — TROPONIN I (HIGH SENSITIVITY): Troponin I (High Sensitivity): 3 ng/L (ref <18)

## 2022-01-19 NOTE — ED Triage Notes (Signed)
Pt reports persistent h/a x 2 wks without hx of same. L sided chest pain with onset this afternoon. Reports tightness progressed and made h/a worse. Pt then reports onset of L arm numbness 1 hr pta. Denies dizziness of vision change. Pt alert and oriented following commands. Pupils equal and reactive. No droop noted or slurred speech. Denies hx of chest pain or cardiac hx. Denies hx of stroke. Pt breathing unlabored speaking in full sentences.

## 2022-01-19 NOTE — ED Notes (Signed)
No CODE STROKE activation per verbal instruction from MD [facility]

## 2022-01-20 ENCOUNTER — Emergency Department
Admission: EM | Admit: 2022-01-20 | Discharge: 2022-01-20 | Disposition: A | Discharge status: Home / Self Care | Payer: BC Managed Care – PPO | Attending: Emergency Medicine | Admitting: Emergency Medicine

## 2022-01-20 DIAGNOSIS — E876 Hypokalemia: Secondary | ICD-10-CM

## 2022-01-20 DIAGNOSIS — R519 Headache, unspecified: Secondary | ICD-10-CM

## 2022-01-20 DIAGNOSIS — R0789 Other chest pain: Secondary | ICD-10-CM

## 2022-01-20 LAB — TROPONIN I (HIGH SENSITIVITY): Troponin I (High Sensitivity): 2 ng/L (ref <18)

## 2022-01-20 MED ORDER — PROCHLORPERAZINE EDISYLATE 10 MG/2ML IJ SOLN
10.0000 mg | Freq: Once | INTRAMUSCULAR | Status: AC
  Administered 2022-01-20: 10 mg via INTRAVENOUS
  Filled 2022-01-20: qty 2

## 2022-01-20 MED ORDER — DIPHENHYDRAMINE HCL 50 MG/ML IJ SOLN
25.0000 mg | Freq: Once | INTRAMUSCULAR | Status: AC
  Administered 2022-01-20: 25 mg via INTRAVENOUS
  Filled 2022-01-20: qty 1

## 2022-01-20 MED ORDER — POTASSIUM CHLORIDE CRYS ER 20 MEQ PO TBCR
40.0000 meq | EXTENDED_RELEASE_TABLET | Freq: Once | ORAL | Status: AC
  Administered 2022-01-20: 40 meq via ORAL
  Filled 2022-01-20: qty 2

## 2022-01-20 MED ORDER — KETOROLAC TROMETHAMINE 30 MG/ML IJ SOLN
15.0000 mg | Freq: Once | INTRAMUSCULAR | Status: AC
  Administered 2022-01-20: 15 mg via INTRAVENOUS
  Filled 2022-01-20: qty 1

## 2022-01-20 MED ORDER — LACTATED RINGERS IV BOLUS
1000.0000 mL | Freq: Once | INTRAVENOUS | Status: AC
  Administered 2022-01-20: 1000 mL via INTRAVENOUS

## 2022-01-20 MED ORDER — ACETAMINOPHEN 500 MG PO TABS
1000.0000 mg | ORAL_TABLET | Freq: Once | ORAL | Status: AC
  Administered 2022-01-20: 1000 mg via ORAL
  Filled 2022-01-20: qty 2

## 2022-01-20 NOTE — ED Provider Notes (Signed)
[facility] Provider Note    Event Date/Time   First MD Initiated Contact with Patient 01/20/22 0002     (approximate)   History   Chest Pain, Headache, and Numbness   HPI  Martha Whitney is a 57 y.o. female who presents to the ED for evaluation of Chest Pain, Headache, and Numbness   I review 11/28 PCP visit. Hx DM, HLD, GERD  Patient presents to the ED for evaluation of a subacute headache and acute chest pain.  She reports 2 weeks of intermittent headache treated with Tylenol and Excedrin, with transient improvement, "they always comes back."  Denies any vision changes when she is wearing her typical glasses.  Denies weakness to the extremities or associated symptoms with her headache until earlier today.  About 12 hours prior to arrival, while laying down, patient reports developing chest burning and tightness that has been waxing and waning all throughout the day today.  Reports developing a few hours of tingling going down her left arm.  With these new acute symptoms in conjunction with a headache she presents to the ED for evaluation.  No fever, syncope, dyspnea, cough, abdominal pain, emesis.  No changes to p.o. intake or toileting  Physical Exam   Triage Vital Signs: ED Triage Vitals  Enc Vitals Group     BP 01/19/22 1946 99/82     Pulse Rate 01/19/22 1940 (!) 117     Resp 01/19/22 1940 20     Temp 01/19/22 1940 98.9 F (37.2 C)     Temp Source 01/19/22 1940 Oral     SpO2 01/19/22 1940 95 %     Weight 01/19/22 1946 145 lb (65.8 kg)     Height 01/19/22 1946 '5\' 3"'$  (1.6 m)     Head Circumference --      Peak Flow --      Pain Score 01/19/22 1946 5     Pain Loc --      Pain Edu? --      Excl. in [place]? --     Most recent vital signs: Vitals:   01/20/22 0130 01/20/22 0200  BP: 98/61 (!) 98/58  Pulse: 87 79  Resp: 16 19  Temp:    SpO2: 93% 93%    General: Awake, no distress.  Well-appearing, fluent and conversational.  No focal  temporal tenderness. CV:  Good peripheral perfusion.  Sinus tachycardia is noted on the monitor, low 100s Resp:  Normal effort.  CTAB Abd:  No distention.  Soft and benign throughout MSK:  No deformity noted.  No lower extremity swelling Neuro:  No focal deficits appreciated. Cranial nerves II through XII intact 5/5 strength and sensation in all 4 extremities Other:     ED Results / Procedures / Treatments   Labs (all labs ordered are listed, but only abnormal results are displayed) Labs Reviewed  BASIC METABOLIC PANEL - Abnormal; Notable for the following components:      Result Value   Potassium 3.0 (*)    Glucose, Bld 136 (*)    Calcium 8.4 (*)    All other components within normal limits  CBG MONITORING, ED - Abnormal; Notable for the following components:   Glucose-Capillary 133 (*)    All other components within normal limits  CBC  POC URINE PREG, ED  TROPONIN I (HIGH SENSITIVITY)  TROPONIN I (HIGH SENSITIVITY)    EKG Sinus tach, rate of 116, normal axis and interval. No clear signs of acute ischemia  RADIOLOGY CXR interpreted by me without evidence of acute cardiopulmonary pathology. CT head interpreted by me without evidence of acute intracranial pathology  Official radiology report(s): DG Chest 2 View  Result Date: 01/19/2022 CLINICAL DATA:  Left-sided chest pain. EXAM: CHEST - 2 VIEW COMPARISON:  March 24, 2019 FINDINGS: The heart size and mediastinal contours are within normal limits. Both lungs are clear. Multiple radiopaque surgical clips are seen within the right upper quadrant. A right shoulder replacement is seen. This represents a new finding when compared to the prior study. The visualized skeletal structures are otherwise unremarkable. IMPRESSION: 1. No active cardiopulmonary disease. Electronically Signed   By: Virgina Norfolk M.D.   On: 01/19/2022 20:19   CT Head Wo Contrast  Result Date: 01/19/2022 CLINICAL DATA:  Numbness. EXAM: CT HEAD WITHOUT  CONTRAST TECHNIQUE: Contiguous axial images were obtained from the base of the skull through the vertex without intravenous contrast. RADIATION DOSE REDUCTION: This exam was performed according to the departmental dose-optimization program which includes automated exposure control, adjustment of the mA and/or kV according to patient size and/or use of iterative reconstruction technique. COMPARISON:  None Available. FINDINGS: Brain: The ventricles and sulci are appropriate size for the patient's age. The gray-white matter discrimination is preserved. There is no acute intracranial hemorrhage. No mass effect or midline shift. No extra-axial fluid collection. Vascular: No hyperdense vessel or unexpected calcification. Skull: Normal. Negative for fracture or focal lesion. Sinuses/Orbits: There is diffuse mucoperiosteal thickening of the right maxillary sinus. The mastoid air cells are clear. Other: None IMPRESSION: 1. No acute intracranial pathology. 2. Chronic right maxillary sinus disease. Electronically Signed   By: Anner Crete M.D.   On: 01/19/2022 20:15    PROCEDURES and INTERVENTIONS:  .1-3 Lead EKG Interpretation  Performed by: Vladimir Crofts, MD Authorized by: Vladimir Crofts, MD     Interpretation: abnormal     ECG rate:  103   ECG rate assessment: tachycardic     Rhythm: sinus tachycardia     Ectopy: none     Conduction: normal     Medications  potassium chloride SA (KLOR-CON M) CR tablet 40 mEq (40 mEq Oral Given 01/20/22 0023)  lactated ringers bolus 1,000 mL (0 mLs Intravenous Stopped 01/20/22 0122)  ketorolac (TORADOL) 30 MG/ML injection 15 mg (15 mg Intravenous Given 01/20/22 0023)  acetaminophen (TYLENOL) tablet 1,000 mg (1,000 mg Oral Given 01/20/22 0023)  prochlorperazine (COMPAZINE) injection 10 mg (10 mg Intravenous Given 01/20/22 0023)  diphenhydrAMINE (BENADRYL) injection 25 mg (25 mg Intravenous Given 01/20/22 0024)     IMPRESSION / MDM / ASSESSMENT AND PLAN / ED COURSE  I  reviewed the triage vital signs and the nursing notes.  Differential diagnosis includes, but is not limited to, ACS, PTX, PNA, muscle strain/spasm, PE, dissection, temporal arteritis, ICH, intracranial mass  {Patient presents with symptoms of an acute illness or injury that is potentially life-threatening.  57 year old female presents to the ED with atypical chest discomfort superimposed on subacute migrainous headache.  Symptoms resolved with a migraine cocktail, potassium repletion and ultimately suitable for outpatient management.  She has a benign workup with hypokalemia that is repleted orally.  2 negative troponins, normal CBC.  Clear CXR and CT head.  As below, she is low risk [place] and I think PE is less likely.  I considered observation admission for this patient, but considering how well she looks, resolution of symptoms and benign workup we decided upon outpatient management.  We discussed close return precautions.  Clinical  Course as of 01/20/22 0715  Sat Jan 20, 2022  0126 Low risk [place]. 1.5 points for tachycardia.  [DS]  9030 Reassessed. Feeling better. Tachycardia has resolved. She is interested in going home [DS]  0214 Reassessed.  Resolution of symptoms.  Discussed return precautions. [DS]    Clinical Course User Index [DS] Vladimir Crofts, MD     FINAL CLINICAL IMPRESSION(S) / ED DIAGNOSES   Final diagnoses:  Other chest pain  Hypokalemia  Bad headache     Rx / DC Orders   ED Discharge Orders     None        Note:  This document was prepared using Dragon voice recognition software and may include unintentional dictation errors.   Vladimir Crofts, MD 01/20/22 [phone]

## 2022-01-20 NOTE — ED Notes (Signed)
Pt brought to ED rm 13 at this time, this RN now assuming care.

## 2022-01-20 NOTE — ED Notes (Signed)
ED Provider at bedside. 

## 2022-02-28 ENCOUNTER — Encounter: Payer: Self-pay | Admitting: Emergency Medicine

## 2022-02-28 ENCOUNTER — Emergency Department: Payer: BC Managed Care – PPO

## 2022-02-28 ENCOUNTER — Emergency Department
Admission: EM | Admit: 2022-02-28 | Discharge: 2022-02-28 | Disposition: A | Discharge status: Home / Self Care | Payer: BC Managed Care – PPO | Attending: Emergency Medicine | Admitting: Emergency Medicine

## 2022-02-28 DIAGNOSIS — N3000 Acute cystitis without hematuria: Secondary | ICD-10-CM | POA: Diagnosis not present

## 2022-02-28 DIAGNOSIS — E119 Type 2 diabetes mellitus without complications: Secondary | ICD-10-CM | POA: Insufficient documentation

## 2022-02-28 DIAGNOSIS — R109 Unspecified abdominal pain: Secondary | ICD-10-CM | POA: Diagnosis present

## 2022-02-28 LAB — CBC
HCT: 41.2 % (ref 36.0–46.0)
Hemoglobin: 13.7 g/dL (ref 12.0–15.0)
MCH: 29 pg (ref 26.0–34.0)
MCHC: 33.3 g/dL (ref 30.0–36.0)
MCV: 87.3 fL (ref 80.0–100.0)
Platelets: 246 10*3/uL (ref 150–400)
RBC: 4.72 MIL/uL (ref 3.87–5.11)
RDW: 12.7 % (ref 11.5–15.5)
WBC: 11.9 10*3/uL — ABNORMAL HIGH (ref 4.0–10.5)
nRBC: 0 % (ref 0.0–0.2)

## 2022-02-28 LAB — COMPREHENSIVE METABOLIC PANEL
ALT: 17 U/L (ref 0–44)
AST: 19 U/L (ref 15–41)
Albumin: 4.1 g/dL (ref 3.5–5.0)
Alkaline Phosphatase: 76 U/L (ref 38–126)
Anion gap: 9 (ref 5–15)
BUN: 13 mg/dL (ref 6–20)
CO2: 26 mmol/L (ref 22–32)
Calcium: 8.9 mg/dL (ref 8.9–10.3)
Chloride: 104 mmol/L (ref 98–111)
Creatinine, Ser: 0.76 mg/dL (ref 0.44–1.00)
GFR, Estimated: >60 mL/min (ref >60)
Glucose, Bld: 161 mg/dL — ABNORMAL HIGH (ref 70–99)
Potassium: 3.4 mmol/L — ABNORMAL LOW (ref 3.5–5.1)
Sodium: 139 mmol/L (ref 135–145)
Total Bilirubin: 0.9 mg/dL (ref 0.3–1.2)
Total Protein: 6.7 g/dL (ref 6.5–8.1)

## 2022-02-28 LAB — URINALYSIS, ROUTINE W REFLEX MICROSCOPIC
Bacteria, UA: NONE SEEN
Bilirubin Urine: NEGATIVE
Glucose, UA: >=500 mg/dL — AB
Hgb urine dipstick: NEGATIVE
Ketones, ur: NEGATIVE mg/dL
Nitrite: NEGATIVE
Protein, ur: NEGATIVE mg/dL
Specific Gravity, Urine: 1.03 (ref 1.005–1.030)
pH: 5 (ref 5.0–8.0)

## 2022-02-28 MED ORDER — ACETAMINOPHEN 500 MG PO TABS
1000.0000 mg | ORAL_TABLET | Freq: Once | ORAL | Status: AC
  Administered 2022-02-28: 1000 mg via ORAL
  Filled 2022-02-28: qty 2

## 2022-02-28 MED ORDER — CEPHALEXIN 500 MG PO CAPS: 500.0000 mg | ORAL_CAPSULE | Freq: Three times a day (TID) | ORAL | 0 refills | Status: AC

## 2022-02-28 MED ORDER — CEPHALEXIN 500 MG PO CAPS
500.0000 mg | ORAL_CAPSULE | Freq: Once | ORAL | Status: AC
  Administered 2022-02-28: 500 mg via ORAL
  Filled 2022-02-28: qty 1

## 2022-02-28 MED ORDER — FENTANYL CITRATE PF 50 MCG/ML IJ SOSY
50.0000 ug | PREFILLED_SYRINGE | Freq: Once | INTRAMUSCULAR | Status: AC
  Administered 2022-02-28: 50 ug via INTRAVENOUS
  Filled 2022-02-28: qty 1

## 2022-02-28 MED ORDER — LACTATED RINGERS IV BOLUS
1000.0000 mL | Freq: Once | INTRAVENOUS | Status: AC
  Administered 2022-02-28: 1000 mL via INTRAVENOUS

## 2022-02-28 MED ORDER — KETOROLAC TROMETHAMINE 30 MG/ML IJ SOLN
15.0000 mg | Freq: Once | INTRAMUSCULAR | Status: AC
  Administered 2022-02-28: 15 mg via INTRAVENOUS
  Filled 2022-02-28: qty 1

## 2022-02-28 MED ORDER — LIDOCAINE 5 % EX PTCH
1.0000 | MEDICATED_PATCH | CUTANEOUS | Status: DC
  Administered 2022-02-28: 1 via TRANSDERMAL
  Filled 2022-02-28: qty 1

## 2022-02-28 MED ORDER — ONDANSETRON HCL 4 MG/2ML IJ SOLN
4.0000 mg | Freq: Once | INTRAMUSCULAR | Status: AC
  Administered 2022-02-28: 4 mg via INTRAVENOUS
  Filled 2022-02-28: qty 2

## 2022-02-28 MED ORDER — LIDOCAINE 5 % EX PTCH: 1.0000 | MEDICATED_PATCH | Freq: Two times a day (BID) | CUTANEOUS | 0 refills | Status: AC

## 2022-02-28 NOTE — Discharge Instructions (Addendum)
Please take Tylenol and ibuprofen/Advil for your pain.  It is safe to take them together, or to alternate them every few hours.  Take up to '1000mg'$  of Tylenol at a time, up to 4 times per day.  Do not take more than 4000 mg of Tylenol in 24 hours.  For ibuprofen, take 400-600 mg, 3 - 4 times per day.  Please use lidocaine patches at your site of pain.  Apply 1 patch at a time, leave on for 12 hours, then remove for 12 hours.  12 hours on, 12 hours off.  Do not apply more than 1 patch at a time.  Take the Keflex antibiotic 3 times daily for the next week to treat a UTI.

## 2022-02-28 NOTE — ED Notes (Signed)
Pt Dc to home. Dc instructions reviewed with all questions answered. Pt voices understanding. IV removed, cath intact, pressure dressing applied. No bleeding noted at site. Pt ambulatory out of dept with steady gait.

## 2022-02-28 NOTE — ED Triage Notes (Signed)
Pt presents ambulatory to triage via POV with complaints of R sided flank pain that started tonight. Pt rates the pain 6/10. Hx of kidney stones - last one had to be surgically removed. Denies N/V/D, abdominal pain, fevers, and CP, or SOB.

## 2022-02-28 NOTE — ED Provider Notes (Signed)
[facility] Provider Note    Event Date/Time   First MD Initiated Contact with Patient 02/28/22 [phone]     (approximate)   History   Flank Pain   HPI  Martha Whitney is a 57 y.o. female who presents to the ED for evaluation of Flank Pain   I reviewed PCP visit from 1/23.  History of kidney stones, DM.   She presents to the ED alongside her husband for evaluation of right-sided flank pain and dysuria.  Reports pain is different than her typical SI joint chronic pain and back pain, and more consistent with her history of kidney stones.  She does report dysuria alongside this in the past couple days.  No fevers or frontal abdominal pain or chest pain or dyspnea.  No cough.  No falls or injuries.  No recent antibiotics.   Physical Exam   Triage Vital Signs: ED Triage Vitals  Enc Vitals Group     BP 02/28/22 0416 130/87     Pulse Rate 02/28/22 0416 87     Resp 02/28/22 0416 18     Temp 02/28/22 0416 98.6 F (37 C)     Temp Source 02/28/22 0416 Oral     SpO2 02/28/22 0416 94 %     Weight 02/28/22 0411 145 lb (65.8 kg)     Height 02/28/22 0411 '5\' 3"'$  (1.6 m)     Head Circumference --      Peak Flow --      Pain Score 02/28/22 0413 6     Pain Loc --      Pain Edu? --      Excl. in [place]? --     Most recent vital signs: Vitals:   02/28/22 0416  BP: 130/87  Pulse: 87  Resp: 18  Temp: 98.6 F (37 C)  SpO2: 94%    General: Awake, no distress.  CV:  Good peripheral perfusion.  Resp:  Normal effort.  Abd:  No distention.  Mild suprapubic tenderness without peritoneal features.  Right-sided CVA tenderness is present, none on the left.  No overlying skin changes or signs of trauma to the back. MSK:  No deformity noted.  Neuro:  No focal deficits appreciated. Other:     ED Results / Procedures / Treatments   Labs (all labs ordered are listed, but only abnormal results are displayed) Labs Reviewed  URINALYSIS, ROUTINE W REFLEX MICROSCOPIC -  Abnormal; Notable for the following components:      Result Value   Color, Urine YELLOW (*)    APPearance HAZY (*)    Glucose, UA >=500 (*)    Leukocytes,Ua MODERATE (*)    All other components within normal limits  CBC - Abnormal; Notable for the following components:   WBC 11.9 (*)    All other components within normal limits  COMPREHENSIVE METABOLIC PANEL - Abnormal; Notable for the following components:   Potassium 3.4 (*)    Glucose, Bld 161 (*)    All other components within normal limits  URINE CULTURE    EKG   RADIOLOGY CT renal study interpreted by me without evidence of ureterolithiasis  Official radiology report(s): CT Renal Stone Study  Result Date: 02/28/2022 CLINICAL DATA:  Abdominal/flank pain.  Tenderness to palpation. EXAM: CT ABDOMEN AND PELVIS WITHOUT CONTRAST TECHNIQUE: Multidetector CT imaging of the abdomen and pelvis was performed following the standard protocol without IV contrast. RADIATION DOSE REDUCTION: This exam was performed according to the departmental dose-optimization program  which includes automated exposure control, adjustment of the mA and/or kV according to patient size and/or use of iterative reconstruction technique. COMPARISON:  06/22/2020 FINDINGS: Lower chest: Subpleural density in the right lower lobe on 4:8 which has a bandlike appearance suggesting scar, especially when compared to prior. Hepatobiliary: Hepatic steatosis.Cholecystectomy. No biliary dilatation Pancreas: Unremarkable. Spleen: Unremarkable. Adrenals/Urinary Tract: Negative adrenals. No hydronephrosis or stone. Unremarkable bladder. Stomach/Bowel: No obstruction. No pericecal inflammation. Left colonic diverticulosis. Vascular/Lymphatic: No acute vascular abnormality. No mass or adenopathy. Reproductive:Hysterectomy Other: No ascites or pneumoperitoneum. Musculoskeletal: No acute abnormalities. IMPRESSION: 1. No acute finding. 2. Hepatic steatosis and colonic diverticulosis.  Electronically Signed   By: Jorje Guild M.D.   On: 02/28/2022 04:49    PROCEDURES and INTERVENTIONS:  Procedures  Medications  cephALEXin (KEFLEX) capsule 500 mg (has no administration in time range)  lidocaine (LIDODERM) 5 % 1 patch (has no administration in time range)  acetaminophen (TYLENOL) tablet 1,000 mg (has no administration in time range)  ketorolac (TORADOL) 30 MG/ML injection 15 mg (has no administration in time range)  lactated ringers bolus 1,000 mL (1,000 mLs Intravenous New Bag/Given 02/28/22 0431)  ondansetron (ZOFRAN) injection 4 mg (4 mg Intravenous Given 02/28/22 0429)  fentaNYL (SUBLIMAZE) injection 50 mcg (50 mcg Intravenous Given 02/28/22 0429)     IMPRESSION / MDM / ASSESSMENT AND PLAN / ED COURSE  I reviewed the triage vital signs and the nursing notes.  Differential diagnosis includes, but is not limited to, ureteral colic, pyelonephritis, sepsis, muscular spasm or strain, aortic dissection, pneumothorax  {Patient presents with symptoms of an acute illness or injury that is potentially life-threatening.  57 year old woman presents with flank pain and dysuria with evidence of possible UTI.  Her urinalysis is concerning for UTI considering her dysuria and moderate leukocytes.  Also has a slight leukocytosis on her CBC.  Metabolic panel is essentially normal.  CT renal study without acute pathology, signs of ureteral stones or pyelonephritis or other intra-abdominal pathology.  Questionable MSK pain versus UTI, but as I discussed with her I think antibiotic course to treat cystitis would be prudent.  No sepsis criteria and she looks well so I do not think she needs to be admitted to the hospital at this point.  We will start antibiotics and discharged with close return precautions.  Clinical Course as of 02/28/22 0558  Wed Feb 28, 2022  0542 Reassessed. Feeling better. We discussed CT results. Discussed UA, dysuria and concern for UTI [DS]    Clinical Course  User Index [DS] Vladimir Crofts, MD     FINAL CLINICAL IMPRESSION(S) / ED DIAGNOSES   Final diagnoses:  Right flank pain  Acute cystitis without hematuria     Rx / DC Orders   ED Discharge Orders          Ordered    cephALEXin (KEFLEX) 500 MG capsule  3 times daily        02/28/22 0557    lidocaine (LIDODERM) 5 %  Every 12 hours        02/28/22 0557             Note:  This document was prepared using Dragon voice recognition software and may include unintentional dictation errors.   Vladimir Crofts, MD 02/28/22 [phone]

## 2022-03-01 LAB — URINE CULTURE

## 2022-04-09 ENCOUNTER — Other Ambulatory Visit: Payer: Self-pay | Admitting: Family Medicine

## 2022-04-09 ENCOUNTER — Ambulatory Visit
Admission: RE | Admit: 2022-04-09 | Discharge: 2022-04-09 | Disposition: A | Discharge status: Home / Self Care | Payer: BC Managed Care – PPO | Source: Ambulatory Visit | Attending: Family Medicine | Admitting: Family Medicine

## 2022-04-09 DIAGNOSIS — M5416 Radiculopathy, lumbar region: Secondary | ICD-10-CM | POA: Diagnosis present

## 2022-04-28 IMAGING — MG MM DIGITAL SCREENING BILAT W/ TOMO AND CAD
6 of 10 series · 6 of 30 positions shown · non-contrast
Comparison: Previous exam(s).

CLINICAL DATA: Screening.

EXAM:
DIGITAL SCREENING BILATERAL MAMMOGRAM WITH TOMOSYNTHESIS AND CAD
TECHNIQUE: Bilateral screening digital craniocaudal and mediolateral oblique
mammograms were obtained. Bilateral screening digital breast
tomosynthesis was performed. The images were evaluated with
computer-aided detection.

[R MLO synth-2D (1 of 2)]
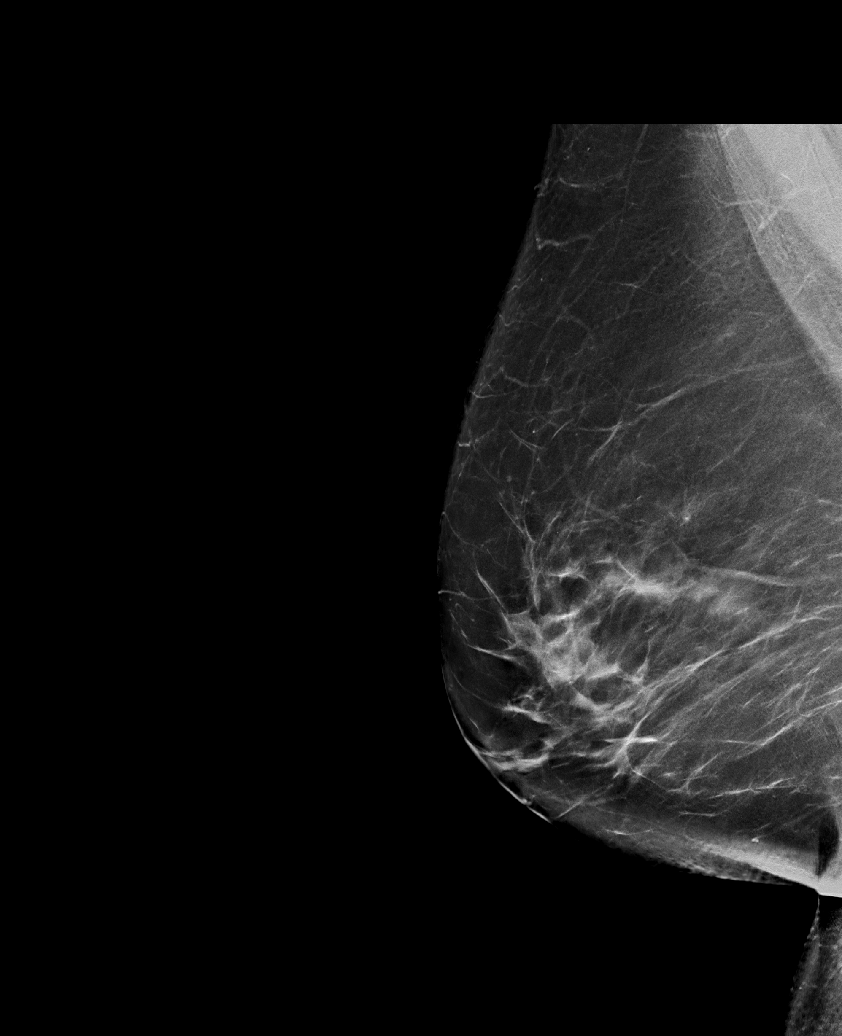

[L MLO synth-2D]
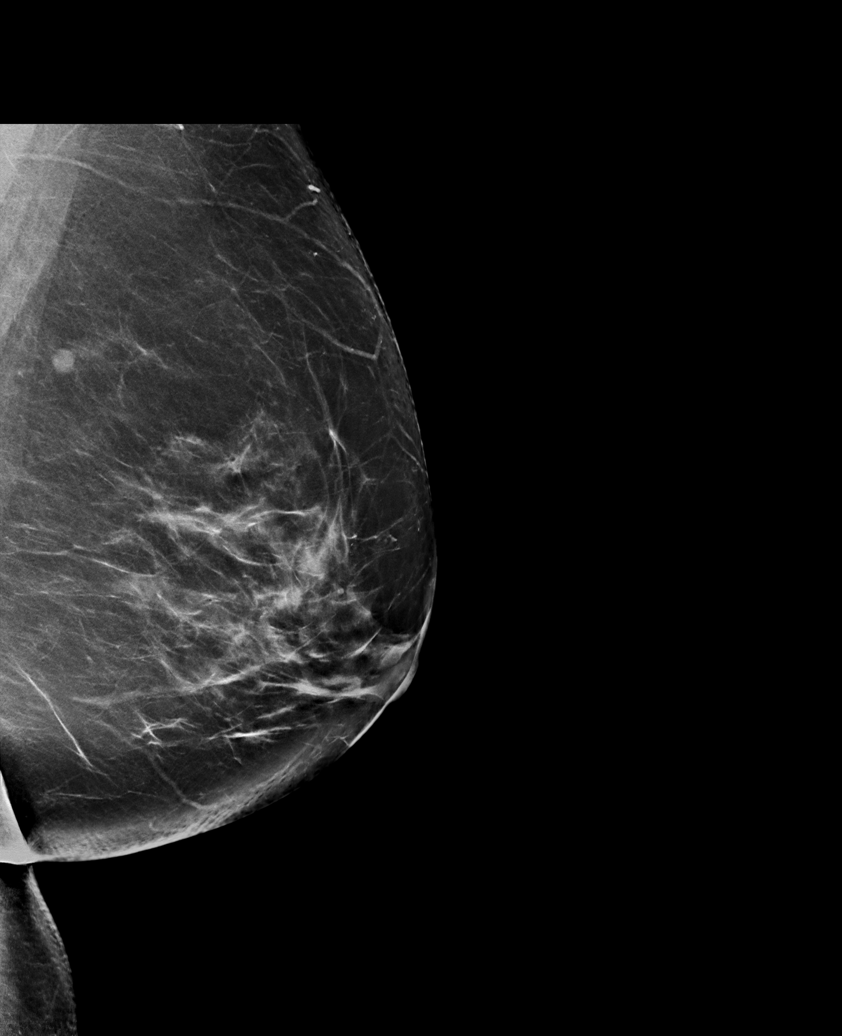

[R MLO synth-2D (2 of 2)]
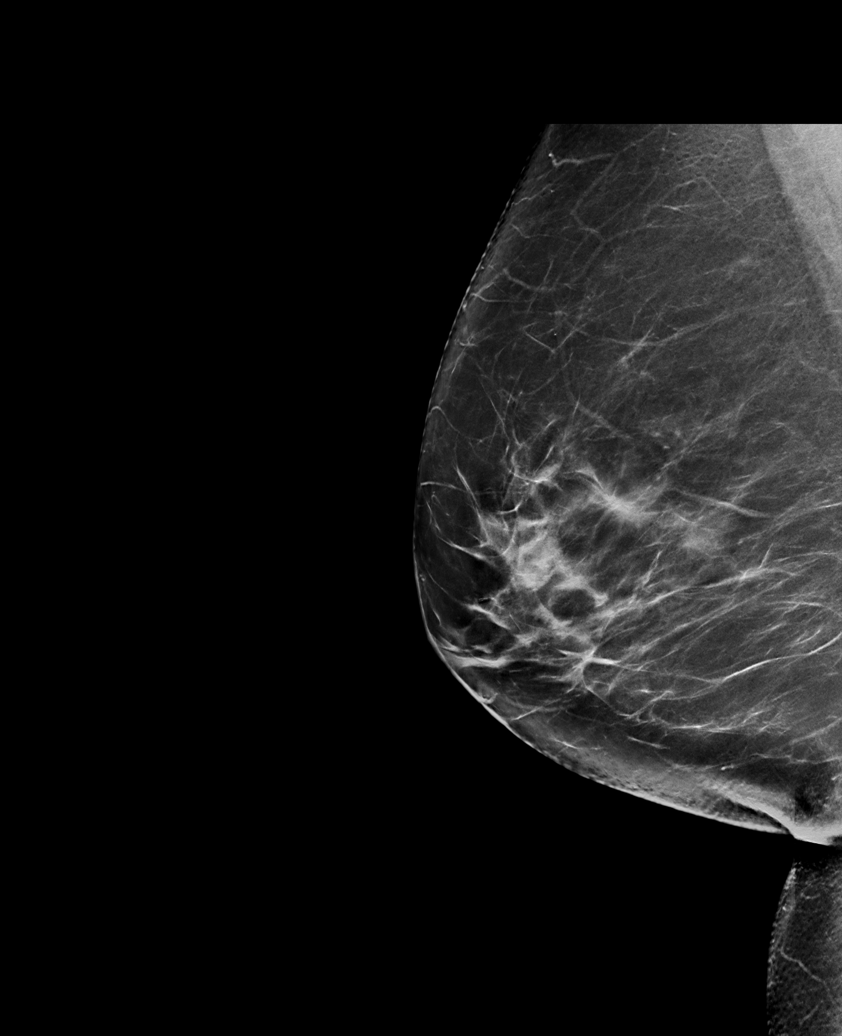

[L CC synth-2D]
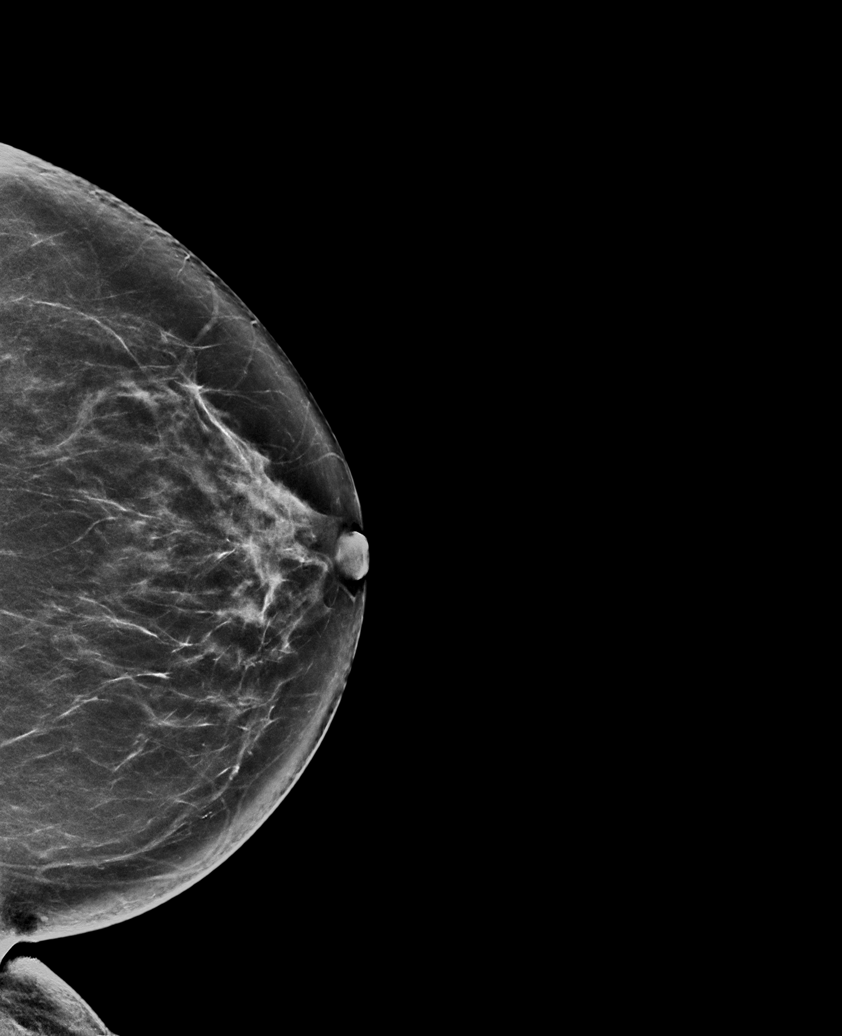

[R CC synth-2D]
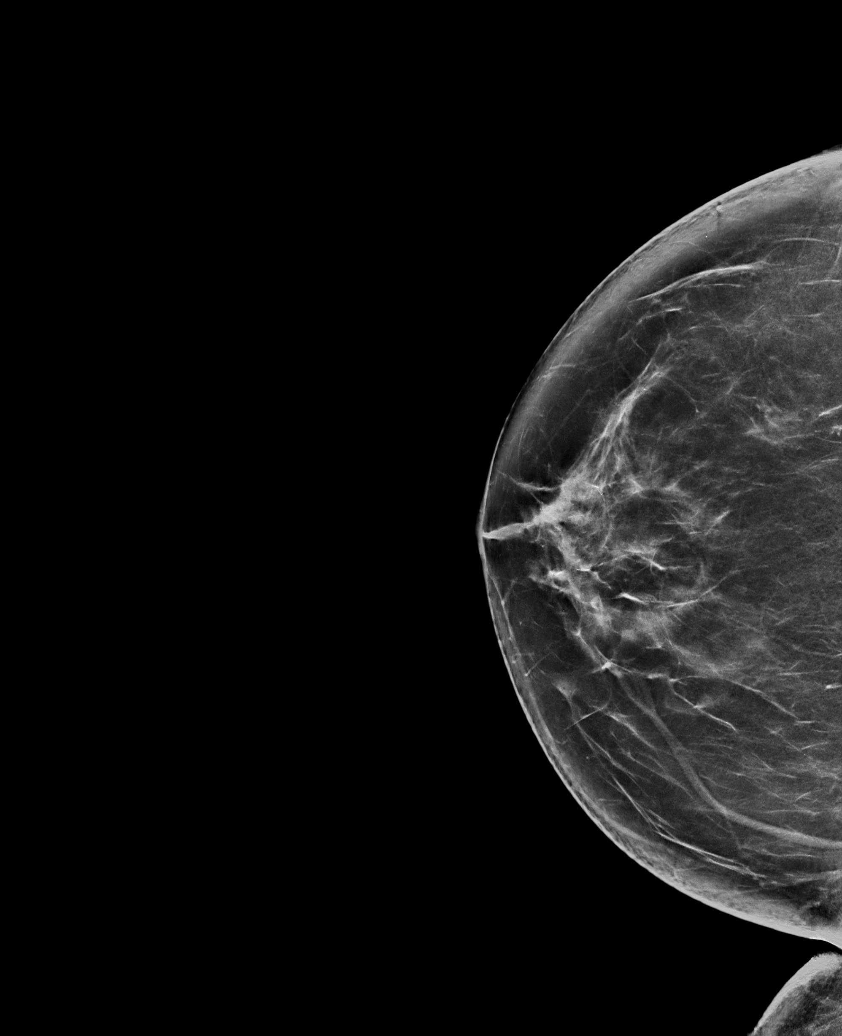

[L CC tomo · tomo slice 41/81.0]
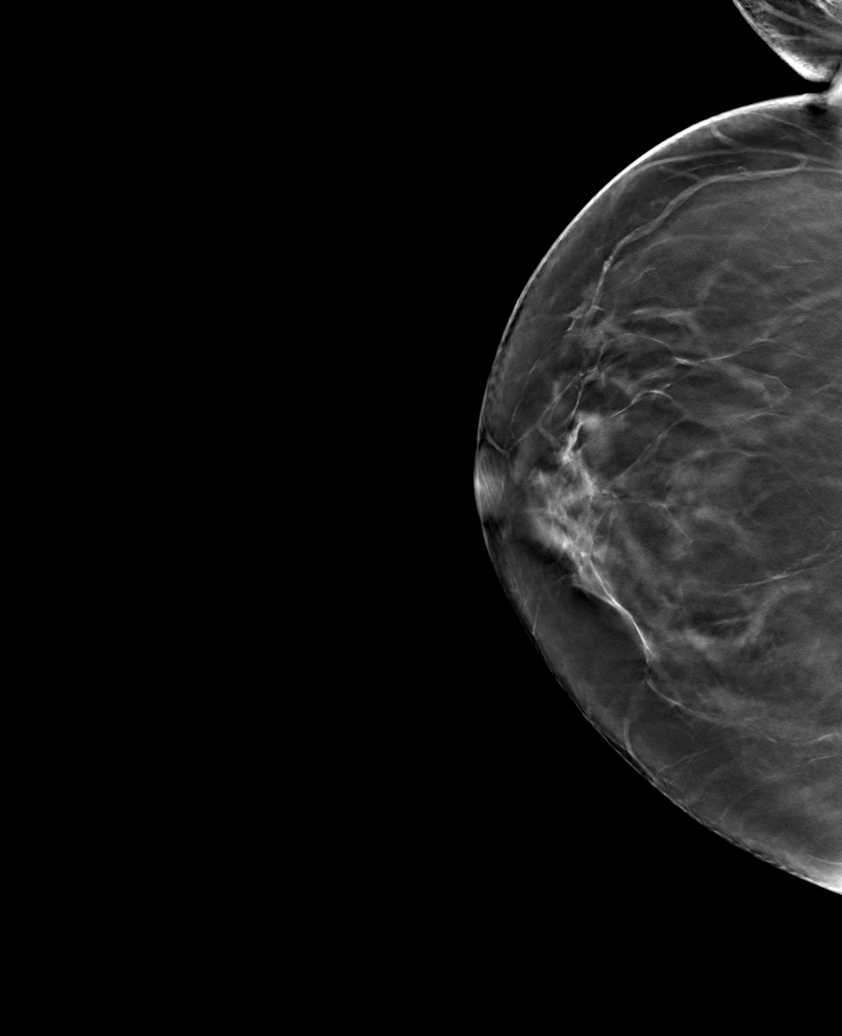

[6 of 30 positions shown; findings below may reference images not displayed]

ACR Breast Density Category b: There are scattered areas of
fibroglandular density.
FINDINGS: There are no findings suspicious for malignancy.
IMPRESSION: No mammographic evidence of malignancy. A result letter of this
screening mammogram will be mailed directly to the patient.

RECOMMENDATION:
Screening mammogram in one year. (Code:51-O-LD2)

BI-RADS CATEGORY  1: Negative.

## 2022-06-04 ENCOUNTER — Other Ambulatory Visit: Payer: Self-pay | Admitting: Internal Medicine

## 2022-06-04 DIAGNOSIS — Z1231 Encounter for screening mammogram for malignant neoplasm of breast: Secondary | ICD-10-CM

## 2022-06-23 LAB — COLOGUARD: COLOGUARD: NEGATIVE

## 2022-08-15 ENCOUNTER — Ambulatory Visit
Admission: RE | Admit: 2022-08-15 | Discharge: 2022-08-15 | Disposition: A | Discharge status: Home / Self Care | Payer: BC Managed Care – PPO | Source: Ambulatory Visit | Attending: Internal Medicine | Admitting: Internal Medicine

## 2022-08-15 DIAGNOSIS — Z1231 Encounter for screening mammogram for malignant neoplasm of breast: Secondary | ICD-10-CM | POA: Insufficient documentation

## 2022-09-08 ENCOUNTER — Emergency Department
Admission: EM | Admit: 2022-09-08 | Discharge: 2022-09-08 | Disposition: A | Discharge status: Home / Self Care | Payer: BC Managed Care – PPO | Attending: Emergency Medicine | Admitting: Emergency Medicine

## 2022-09-08 ENCOUNTER — Encounter: Payer: Self-pay | Admitting: *Deleted

## 2022-09-08 ENCOUNTER — Other Ambulatory Visit: Payer: Self-pay

## 2022-09-08 ENCOUNTER — Emergency Department: Payer: BC Managed Care – PPO

## 2022-09-08 DIAGNOSIS — X58XXXA Exposure to other specified factors, initial encounter: Secondary | ICD-10-CM | POA: Insufficient documentation

## 2022-09-08 DIAGNOSIS — S29001A Unspecified injury of muscle and tendon of front wall of thorax, initial encounter: Secondary | ICD-10-CM | POA: Diagnosis present

## 2022-09-08 DIAGNOSIS — Z87442 Personal history of urinary calculi: Secondary | ICD-10-CM | POA: Insufficient documentation

## 2022-09-08 DIAGNOSIS — E119 Type 2 diabetes mellitus without complications: Secondary | ICD-10-CM | POA: Insufficient documentation

## 2022-09-08 DIAGNOSIS — S299XXA Unspecified injury of thorax, initial encounter: Secondary | ICD-10-CM

## 2022-09-08 MED ORDER — HYDROCODONE-ACETAMINOPHEN 5-325 MG PO TABS
1.0000 | ORAL_TABLET | Freq: Once | ORAL | Status: AC
  Administered 2022-09-08: 1 via ORAL
  Filled 2022-09-08: qty 1

## 2022-09-08 MED ORDER — KETOROLAC TROMETHAMINE 30 MG/ML IJ SOLN
30.0000 mg | Freq: Once | INTRAMUSCULAR | Status: AC
  Administered 2022-09-08: 30 mg via INTRAMUSCULAR
  Filled 2022-09-08: qty 1

## 2022-09-08 MED ORDER — HYDROCODONE-ACETAMINOPHEN 5-325 MG PO TABS: 1.0000 | ORAL_TABLET | Freq: Four times a day (QID) | ORAL | 0 refills | Status: DC | PRN

## 2022-09-08 MED ORDER — ACETAMINOPHEN 325 MG PO TABS
650.0000 mg | ORAL_TABLET | Freq: Once | ORAL | Status: DC
  Filled 2022-09-08: qty 2

## 2022-09-08 NOTE — ED Triage Notes (Signed)
Pt reporting a hard lump in the upper left abdominal area "over the ribs"  that has been there for about a month, area has become larger and more tender over the past week. Pt denies trauma to the area.

## 2022-09-08 NOTE — Discharge Instructions (Signed)
Your rib x-rays were normal and did not show a mass.  You can take the Norco every 6 hours as needed for pain.  Please keep in mind that this also contains Tylenol so do not take Tylenol at the same time you take the Norco.  Please follow-up with your primary care provider if you continue to have pain.  At that point you could discuss further workup like a chest CT.

## 2022-09-08 NOTE — ED Provider Notes (Signed)
[facility] Provider Note    Event Date/Time   First MD Initiated Contact with Patient 09/08/22 1939     (approximate)   History   RIB Pain   HPI  Martha Whitney is a 57 y.o. female with PMH of diabetes, GERD, arthritis and kidney stones presents for evaluation of a lump over her left ribs.  Patient states it has been present for about a month but over the last week it is increased in size and has become more tender.  She denies any trauma to the area.  She denies fever, night sweats and unintentional weight changes.      Physical Exam   Triage Vital Signs: ED Triage Vitals [09/08/22 1903]  Encounter Vitals Group     BP 121/81     Systolic BP Percentile      Diastolic BP Percentile      Pulse Rate 82     Resp 16     Temp 98.4 F (36.9 C)     Temp Source Oral     SpO2 100 %     Weight      Height      Head Circumference      Peak Flow      Pain Score 6     Pain Loc      Pain Education      Exclude from Growth Chart     Most recent vital signs: Vitals:   09/08/22 1903 09/08/22 2200  BP: 121/81 124/77  Pulse: 82 71  Resp: 16 18  Temp: 98.4 F (36.9 C)   SpO2: 100% 100%    General: Awake, no distress.  CV:  Good peripheral perfusion.  Resp:  Normal effort.  Abd:  No distention.  Other:  Hard palpable mass on the left edge of the rib cage, tender to palpation, no overlying skin changes, erythema, induration or fluctuance.   ED Results / Procedures / Treatments   Labs (all labs ordered are listed, but only abnormal results are displayed) Labs Reviewed - No data to display   RADIOLOGY  Left rib x-ray obtained to evaluate for possible mass.  I interpreted the images as well as reviewed the radiologist report which was negative.   PROCEDURES:  Critical Care performed: No  Procedures   MEDICATIONS ORDERED IN ED: Medications  acetaminophen (TYLENOL) tablet 650 mg (650 mg Oral Patient Refused/Not Given 09/08/22 2043)   HYDROcodone-acetaminophen (NORCO/VICODIN) 5-325 MG per tablet 1 tablet (1 tablet Oral Given 09/08/22 2043)  ketorolac (TORADOL) 30 MG/ML injection 30 mg (30 mg Intramuscular Given 09/08/22 2147)     IMPRESSION / MDM / ASSESSMENT AND PLAN / ED COURSE  I reviewed the triage vital signs and the nursing notes.                             57 year old female presents for evaluation of a lump on her ribs.  VSS in triage patient NAD on exam.  Differential diagnosis includes, but is not limited to, muscle strain, chest wall mass fractured rib.  Patient's presentation is most consistent with acute complicated illness / injury requiring diagnostic workup.  Left rib x-ray obtained, interpreted the images as well as reviewed the radiologist report which was negative for fracture or mass.  I used bedside ultrasound to evaluate the area and did not see a mass or fluid pocket.  Given patient's negative x-ray I feel she is stable  for outpatient management.  She reports her pain is being worse with movement so I suspect she has a muscle strain.  My suspicion for a cancerous mass is low as she has not had any fever, night sweats or unintentional weight loss.  I advised her to follow-up with her primary care provider if she continues to have pain and at that point they could discuss further imaging like a CT scan.  Will be sent a prescription for pain medication.  She was agreeable to plan, voiced understanding and was stable at discharge.      FINAL CLINICAL IMPRESSION(S) / ED DIAGNOSES   Final diagnoses:  Injury of chest wall, initial encounter     Rx / DC Orders   ED Discharge Orders          Ordered    HYDROcodone-acetaminophen (NORCO/VICODIN) 5-325 MG tablet  Every 6 hours PRN        09/08/22 2151             Note:  This document was prepared using Dragon voice recognition software and may include unintentional dictation errors.   Cameron Ali, PA-C 09/08/22 2204    Merwyn Katos, MD 09/09/22 [phone]

## 2022-09-08 NOTE — ED Notes (Signed)
Pt verbalizes understanding of discharge instructions. Opportunity for questioning and answers were provided. Pt discharged from ED to home with husband.    

## 2022-09-08 NOTE — ED Notes (Signed)
Pt gone to xray

## 2022-09-09 ENCOUNTER — Telehealth: Payer: Self-pay | Admitting: Emergency Medicine

## 2022-09-09 MED ORDER — HYDROCODONE-ACETAMINOPHEN 5-325 MG PO TABS: 1.0000 | ORAL_TABLET | Freq: Four times a day (QID) | ORAL | 0 refills | Status: DC | PRN

## 2022-09-09 NOTE — Telephone Encounter (Signed)
[facility]. Resent to [facility]

## 2022-09-10 NOTE — Group Note (Deleted)

## 2022-09-20 ENCOUNTER — Other Ambulatory Visit: Payer: Self-pay | Admitting: Internal Medicine

## 2022-09-20 DIAGNOSIS — R0789 Other chest pain: Secondary | ICD-10-CM

## 2022-09-27 ENCOUNTER — Ambulatory Visit
Admission: RE | Admit: 2022-09-27 | Discharge: 2022-09-27 | Disposition: A | Discharge status: Home / Self Care | Payer: BC Managed Care – PPO | Source: Ambulatory Visit | Attending: Internal Medicine | Admitting: Internal Medicine

## 2022-09-27 DIAGNOSIS — R0789 Other chest pain: Secondary | ICD-10-CM | POA: Diagnosis present

## 2023-03-28 ENCOUNTER — Other Ambulatory Visit: Payer: Self-pay | Admitting: Family Medicine

## 2023-03-28 DIAGNOSIS — M5416 Radiculopathy, lumbar region: Secondary | ICD-10-CM

## 2023-03-30 ENCOUNTER — Ambulatory Visit
Admission: RE | Admit: 2023-03-30 | Discharge: 2023-03-30 | Disposition: A | Discharge status: Home / Self Care | Source: Ambulatory Visit | Attending: Family Medicine | Admitting: Family Medicine

## 2023-03-30 DIAGNOSIS — M5416 Radiculopathy, lumbar region: Secondary | ICD-10-CM

## 2023-04-01 ENCOUNTER — Other Ambulatory Visit: Payer: Self-pay | Admitting: Internal Medicine

## 2023-04-01 DIAGNOSIS — R911 Solitary pulmonary nodule: Secondary | ICD-10-CM

## 2023-04-02 ENCOUNTER — Other Ambulatory Visit: Payer: Self-pay | Admitting: Family Medicine

## 2023-04-02 ENCOUNTER — Ambulatory Visit
Admission: RE | Admit: 2023-04-02 | Discharge: 2023-04-02 | Disposition: A | Discharge status: Home / Self Care | Payer: Self-pay | Source: Ambulatory Visit | Attending: Neurosurgery | Admitting: Neurosurgery

## 2023-04-02 ENCOUNTER — Ambulatory Visit
Admission: RE | Admit: 2023-04-02 | Discharge: 2023-04-02 | Disposition: A | Discharge status: Home / Self Care | Source: Ambulatory Visit | Attending: Internal Medicine | Admitting: Internal Medicine

## 2023-04-02 DIAGNOSIS — Z049 Encounter for examination and observation for unspecified reason: Secondary | ICD-10-CM

## 2023-04-02 DIAGNOSIS — R911 Solitary pulmonary nodule: Secondary | ICD-10-CM | POA: Diagnosis present

## 2023-04-17 ENCOUNTER — Ambulatory Visit: Admitting: Neurosurgery

## 2023-04-17 NOTE — Progress Notes (Addendum)
 Referring Physician:  Brant Caldron, FNP [id number] [place],  [place] [address]  Primary Physician:  Jimmy Moulding, MD  History of Present Illness: 04/18/2023 Ms. Kineta Fudala is here today with a chief complaint of back and right leg pain.  Her back pain is worse than her leg pain.  She has been having this for approximately 1 year or more.  She has had multiple injections as noted below.  She has tried physical therapy without improvement.  She gets some numbness and tingling but it is intermittent.  Most activities make it worse.  Laying on a heating pad or changing positions sometimes helps.  Bowel/Bladder Dysfunction: none  Conservative measures:  Physical therapy: has participated in at [facility] for 6 weeks and has been discharged, she is doing this at home as well.  Multimodal medical therapy including regular antiinflammatories: Gabapentin , Hydrocodone , Tylenol , Tramadol, Celebrex Injections:  03/28/2023: Right L4-5 and right L5-S1 transforaminal ESI 01/18/2023: Right L4 trigger point injection 12/21/2022: Right L4-5 and right L5-S1 transforaminal ESI (75% relief) 07/26/2022: Right L4-5 and right L5-S1 transforaminal ESI (90% relief) 04/11/2022: Right L4-5 and right L5-S1 transforaminal ESI (95% relief)  03/20/2022: Right sacroiliac joint injection (no relief) 12/15/2021: Right sacroiliac joint injection (90% relief) 08/01/2021: Right sacroiliac joint injection (95% relief)   Past Surgery: no spinal surgeries  Adelaida Holts has no symptoms of cervical myelopathy.  The symptoms are causing a significant impact on the patient's life.   I have utilized the care everywhere function in epic to review the outside records available from external health systems.  Review of Systems:  A 10 point review of systems is negative, except for the pertinent positives and negatives detailed in the HPI.  Past Medical History: Past Medical History:   Diagnosis Date   Arthritis    Cancer (HCC)    skin ca on face/CERVICAL EARYLY 20'S   Diabetes mellitus without complication (HCC)    Difficult intubation    Endometriosis determined by laparoscopy    GERD (gastroesophageal reflux disease)    History of kidney stones    H/O   Pneumonia    Restless leg syndrome 2007    Past Surgical History: Past Surgical History:  Procedure Laterality Date   ABDOMINAL HYSTERECTOMY     CARPAL TUNNEL RELEASE Left 2003   CHOLECYSTECTOMY     COLONOSCOPY     CYSTECTOMY N/A 1969   cyst removed from throat   CYSTOSCOPY/URETEROSCOPY/HOLMIUM LASER/STENT PLACEMENT Left 09/22/2020   Procedure: CYSTOSCOPY/URETEROSCOPY/HOLMIUM LASER/STENT PLACEMENT;  Surgeon: Dustin Gimenez, MD;  Location: [facility];  Service: Urology;  Laterality: Left;   EXTRACORPOREAL SHOCK WAVE LITHOTRIPSY Left 07/28/2020   Procedure: EXTRACORPOREAL SHOCK WAVE LITHOTRIPSY (ESWL);  Surgeon: Lawerence Pressman, MD;  Location: [facility];  Service: Urology;  Laterality: Left;   GANGLION CYST EXCISION Left 10/21/2014   Procedure: REMOVAL GANGLION OF WRIST;  Surgeon: Molli Angelucci, MD;  Location: [facility];  Service: Orthopedics;  Laterality: Left;   LAPAROSCOPIC SALPINGO OOPHERECTOMY Bilateral 06/22/2014   Procedure: LAPAROSCOPIC SALPINGO OOPHORECTOMY;  Surgeon: Raynell Caller, MD;  Location: [facility];  Service: Gynecology;  Laterality: Bilateral;   LAPAROSCOPIC TOTAL HYSTERECTOMY     LEEP N/A 1995   MULTIPLE TOOTH EXTRACTIONS     SHOULDER ARTHROSCOPY WITH OPEN ROTATOR CUFF REPAIR Right 07/09/2017   Procedure: SHOULDER ARTHROSCOPY WITH OPEN ROTATOR CUFF REPAIR;  Surgeon: Elner Hahn, MD;  Location: [facility];  Service: Orthopedics;  Laterality: Right;  extensive debridement, decompression, miniopen biceps tenodesis,  mini open rotator cuff repair   TONSILLECTOMY     TOTAL SHOULDER ARTHROPLASTY Right 02/18/2020   Procedure: TOTAL SHOULDER ARTHROPLASTY;  Surgeon: Elner Hahn, MD;  Location: [facility]  ORS;  Service: Orthopedics;  Laterality: Right;    Allergies: Allergies as of 04/18/2023 - Reviewed 09/08/2022  Allergen Reaction Noted   Barbital Hives 05/12/2014   Influenza virus vaccine Hives 05/12/2014   Sulfa antibiotics Hives 05/12/2014   Other Itching and Other (See Comments) 10/13/2014   Pneumococcal vaccines Swelling 06/14/2014   Tape Rash 07/03/2017    Medications:  Current Outpatient Medications:    gabapentin  (NEURONTIN ) 400 MG capsule, Take 400 mg by mouth 2 (two) times daily as needed (for restless legs)., Disp: , Rfl:    HYDROcodone -acetaminophen  (NORCO/VICODIN) 5-325 MG tablet, Take 1 tablet by mouth every 6 (six) hours as needed for severe pain., Disp: 16 tablet, Rfl: 0   JARDIANCE 10 MG TABS tablet, Take 10 mg by mouth daily., Disp: , Rfl:    pantoprazole (PROTONIX) 40 MG tablet, Take 40 mg by mouth daily., Disp: , Rfl:    phentermine (ADIPEX-P) 37.5 MG tablet, Take 37.5 mg by mouth every morning., Disp: , Rfl:    pravastatin (PRAVACHOL) 20 MG tablet, Take 20 mg by mouth every evening., Disp: , Rfl:    Semaglutide (RYBELSUS) 7 MG TABS, Take 7 mg by mouth every morning., Disp: , Rfl:    traZODone  (DESYREL ) 50 MG tablet, Take 50-200 mg by mouth at bedtime., Disp: , Rfl:   Social History: Social History   Tobacco Use   Smoking status: Never   Smokeless tobacco: Never  Vaping Use   Vaping status: Never Used  Substance Use Topics   Alcohol use: Not Currently   Drug use: No    Family Medical History: Family History  Problem Relation Age of Onset   Heart disease Father    Heart disease Maternal Grandmother    Heart disease Paternal Grandmother    Breast cancer Neg Hx     Physical Examination: There were no vitals filed for this visit.  General: Patient is in no apparent distress. Attention to examination is appropriate.  Neck:   Supple.  Full range of motion.  Respiratory: Patient is breathing without any difficulty.   NEUROLOGICAL:     Awake,  alert, oriented to person, place, and time.  Speech is clear and fluent.   Cranial Nerves: Pupils equal round and reactive to light.  Facial tone is symmetric.  Facial sensation is symmetric. Shoulder shrug is symmetric. Tongue protrusion is midline.  There is no pronator drift.  Strength: Side Biceps Triceps Deltoid Interossei Grip Wrist Ext. Wrist Flex.  R 5 5 5 5 5 5 5   L 5 5 5 5 5 5 5    Side Iliopsoas Quads Hamstring PF DF EHL  R 5 5 5 5 5 5   L 5 5 5 5 5 5    Reflexes are 1+ and symmetric at the biceps, triceps, brachioradialis, patella and achilles.   Hoffman's is absent.   Bilateral upper and lower extremity sensation is intact to light touch.    No evidence of dysmetria noted.  Gait is normal.    +TTP R>L SI joint  Medical Decision Making  Imaging: MRI L spine 03/30/2023 Disc levels:   T12- L1: Unremarkable.   L1-L2: Unremarkable.   L2-L3: Foraminal predominant disc bulging without impingement   L3-L4: Degenerative facet spurring with slight anterolisthesis. Mild disc bulging with small left inferior foraminal protrusion.  L4-L5: Degenerative facet spurring.  Mild disc bulging.   L5-S1:Mild to moderate degenerative facet spurring. Negative disc. No neural impingement.   IMPRESSION: Lumbar spine degeneration primarily affecting facets with slight L3-4 anterolisthesis. No compressive stenosis or change from 2024.     Electronically Signed   By: Ronnette Coke M.D.   On: 03/30/2023 08:34  I have personally reviewed the images and agree with the above interpretation.  Assessment and Plan: Ms. Star is a pleasant 58 y.o. female with right-sided sacroiliitis.  I recommended that she have an additional injection in the right SI joint.  If she has greater than 50% relief, she will be a candidate for SI joint fusion.  We discussed this is a possibility for her.  If she does not respond to SI joint injection this time, I will recommend nerve conduction study  with the idea that she may have some underlying neuropathy that might be treated with spinal cord stimulation.  I spent a total of 30 minutes in this patient's care today. This time was spent reviewing pertinent records including imaging studies, obtaining and confirming history, performing a directed evaluation, formulating and discussing my recommendations, and documenting the visit within the medical record.      Thank you for involving me in the care of this patient.      Eviana Sibilia K. Mont Antis MD, [facility] Neurosurgery

## 2023-04-18 ENCOUNTER — Ambulatory Visit: Admitting: Neurosurgery

## 2023-04-18 ENCOUNTER — Encounter: Payer: Self-pay | Admitting: Neurosurgery

## 2023-04-18 VITALS — BP 102/68 | Ht 62.0 in | Wt 120.0 lb

## 2023-04-18 DIAGNOSIS — M461 Sacroiliitis, not elsewhere classified: Secondary | ICD-10-CM

## 2023-04-18 DIAGNOSIS — M533 Sacrococcygeal disorders, not elsewhere classified: Secondary | ICD-10-CM

## 2023-04-30 ENCOUNTER — Other Ambulatory Visit: Payer: Self-pay | Admitting: *Deleted

## 2023-04-30 ENCOUNTER — Ambulatory Visit
Admission: RE | Admit: 2023-04-30 | Discharge: 2023-04-30 | Disposition: A | Discharge status: Home / Self Care | Source: Ambulatory Visit | Attending: Physician Assistant | Admitting: *Deleted

## 2023-04-30 ENCOUNTER — Ambulatory Visit
Admission: RE | Admit: 2023-04-30 | Discharge: 2023-04-30 | Disposition: A | Discharge status: Home / Self Care | Source: Ambulatory Visit | Attending: Physician Assistant | Admitting: Physician Assistant

## 2023-04-30 DIAGNOSIS — N200 Calculus of kidney: Secondary | ICD-10-CM | POA: Insufficient documentation

## 2023-05-01 ENCOUNTER — Telehealth: Payer: Self-pay | Admitting: Physician Assistant

## 2023-05-01 ENCOUNTER — Ambulatory Visit (INDEPENDENT_AMBULATORY_CARE_PROVIDER_SITE_OTHER): Admitting: Physician Assistant

## 2023-05-01 ENCOUNTER — Encounter: Payer: Self-pay | Admitting: Physician Assistant

## 2023-05-01 VITALS — BP 110/74 | HR 78 | Ht 62.0 in | Wt 120.0 lb

## 2023-05-01 DIAGNOSIS — N200 Calculus of kidney: Secondary | ICD-10-CM

## 2023-05-01 DIAGNOSIS — R350 Frequency of micturition: Secondary | ICD-10-CM | POA: Diagnosis not present

## 2023-05-01 LAB — URINALYSIS, COMPLETE
Bilirubin, UA: NEGATIVE
Ketones, UA: NEGATIVE
Nitrite, UA: NEGATIVE
Protein,UA: NEGATIVE
RBC, UA: NEGATIVE
Specific Gravity, UA: 1.01 (ref 1.005–1.030)
Urobilinogen, Ur: 0.2 mg/dL (ref 0.2–1.0)
pH, UA: 6.5 (ref 5.0–7.5)

## 2023-05-01 LAB — MICROSCOPIC EXAMINATION

## 2023-05-01 LAB — BLADDER SCAN AMB NON-IMAGING: Scan Result: 145

## 2023-05-01 MED ORDER — CEFUROXIME AXETIL 250 MG PO TABS
250.0000 mg | ORAL_TABLET | Freq: Two times a day (BID) | ORAL | 0 refills | Status: AC
Start: 2023-05-01 — End: 2023-05-06

## 2023-05-01 MED ORDER — PHENAZOPYRIDINE HCL 200 MG PO TABS
200.0000 mg | ORAL_TABLET | Freq: Three times a day (TID) | ORAL | 0 refills | Status: AC | PRN
Start: 2023-05-01 — End: ?

## 2023-05-01 NOTE — Telephone Encounter (Signed)
 Patient called and stated that [facility] [address] that they received her RX for her Antibiotic that was prescribed at her visit today, but she has not received confirmation that the Tramadol was sent. Patient is requesting for that RX to be sent today. Please advise patient.

## 2023-05-01 NOTE — Progress Notes (Signed)
 05/01/2023 2:55 PM   Martha Whitney February 12, 1965 [id number]  CC: Chief Complaint  Patient presents with   Nephrolithiasis   HPI: Martha Whitney is a 58 y.o. female with PMH chronic back pain, diabetes on Jardiance, and nephrolithiasis who presents today for evaluation of possible UTI.   Today she reports an approximate 10-day history of urinary frequency x 5-7 and intermittent dysuria.  She has also been having some right sided back pain and taking tramadol, which helps.  She has not taken any antibiotics.  She denies fever, chills, nausea, or vomiting.  KUB today with no radiopaque urolithiasis.  CT stone study dated 02/08/2022 with no urolithiasis.  In-office UA today positive for 3+ glucose and trace leukocytes; urine microscopy with 6-10 WBCs/HPF. PVR .  PMH: Past Medical History:  Diagnosis Date   Arthritis    Cancer (HCC)    skin ca on face/CERVICAL EARYLY 20'S   Diabetes mellitus without complication (HCC)    Difficult intubation    Endometriosis determined by laparoscopy    GERD (gastroesophageal reflux disease)    History of kidney stones    H/O   Pneumonia    Restless leg syndrome 2007    Surgical History: Past Surgical History:  Procedure Laterality Date   ABDOMINAL HYSTERECTOMY     CARPAL TUNNEL RELEASE Left 2003   CHOLECYSTECTOMY     COLONOSCOPY     CYSTECTOMY N/A 1969   cyst removed from throat   CYSTOSCOPY/URETEROSCOPY/HOLMIUM LASER/STENT PLACEMENT Left 09/22/2020   Procedure: CYSTOSCOPY/URETEROSCOPY/HOLMIUM LASER/STENT PLACEMENT;  Surgeon: Dustin Gimenez, MD;  Location: [facility];  Service: Urology;  Laterality: Left;   EXTRACORPOREAL SHOCK WAVE LITHOTRIPSY Left 07/28/2020   Procedure: EXTRACORPOREAL SHOCK WAVE LITHOTRIPSY (ESWL);  Surgeon: Lawerence Pressman, MD;  Location: [facility];  Service: Urology;  Laterality: Left;   GANGLION CYST EXCISION Left 10/21/2014   Procedure: REMOVAL GANGLION OF WRIST;  Surgeon: Molli Angelucci, MD;  Location:  [facility];  Service: Orthopedics;  Laterality: Left;   LAPAROSCOPIC SALPINGO OOPHERECTOMY Bilateral 06/22/2014   Procedure: LAPAROSCOPIC SALPINGO OOPHORECTOMY;  Surgeon: Raynell Caller, MD;  Location: [facility];  Service: Gynecology;  Laterality: Bilateral;   LAPAROSCOPIC TOTAL HYSTERECTOMY     LEEP N/A 1995   MULTIPLE TOOTH EXTRACTIONS     SHOULDER ARTHROSCOPY WITH OPEN ROTATOR CUFF REPAIR Right 07/09/2017   Procedure: SHOULDER ARTHROSCOPY WITH OPEN ROTATOR CUFF REPAIR;  Surgeon: Elner Hahn, MD;  Location: [facility];  Service: Orthopedics;  Laterality: Right;  extensive debridement, decompression, miniopen biceps tenodesis, mini open rotator cuff repair   TONSILLECTOMY     TOTAL SHOULDER ARTHROPLASTY Right 02/18/2020   Procedure: TOTAL SHOULDER ARTHROPLASTY;  Surgeon: Elner Hahn, MD;  Location: [facility];  Service: Orthopedics;  Laterality: Right;    Home Medications:  Allergies as of 05/01/2023       Reactions   Barbital Hives   Influenza Virus Vaccine Hives   Sulfa Antibiotics Hives   Other Itching, Other (See Comments)   Surgical Glue; Skin Irritation   Pneumococcal Vaccines Swelling   Tape Rash        Medication List        Accurate as of May 01, 2023  2:55 PM. If you have any questions, ask your nurse or doctor.          gabapentin  400 MG capsule Commonly known as: NEURONTIN  Take 400 mg by mouth 2 (two) times daily as needed (for restless legs).   Jardiance 10 MG Tabs tablet Generic drug:  empagliflozin Take 10 mg by mouth daily.   OZEMPIC (2 MG/DOSE)  Inject into the skin once a week.   pantoprazole 40 MG tablet Commonly known as: PROTONIX Take 40 mg by mouth daily.   pravastatin 20 MG tablet Commonly known as: PRAVACHOL Take 20 mg by mouth every evening.   traZODone  50 MG tablet Commonly known as: DESYREL  Take 50-200 mg by mouth at bedtime.        Allergies:  Allergies  Allergen Reactions   Barbital Hives   Influenza Virus Vaccine  Hives   Sulfa Antibiotics Hives   Other Itching and Other (See Comments)    Surgical Glue; Skin Irritation   Pneumococcal Vaccines Swelling   Tape Rash    Family History: Family History  Problem Relation Age of Onset   Heart disease Father    Heart disease Maternal Grandmother    Heart disease Paternal Grandmother    Breast cancer Neg Hx     Social History:   reports that she has never smoked. She has never used smokeless tobacco. She reports that she does not currently use alcohol. She reports that she does not use drugs.  Physical Exam: BP 110/74   Pulse 78   Ht 5\' 2"  (1.575 m)   Wt 120 lb (54.4 kg)   BMI 21.95 kg/m   Constitutional:  Alert and oriented, no acute distress, nontoxic appearing HEENT: [place], AT Cardiovascular: No clubbing, cyanosis, or edema Respiratory: Normal respiratory effort, no increased work of breathing Skin: No rashes, bruises or suspicious lesions Neurologic: Grossly intact, no focal deficits, moving all 4 extremities Psychiatric: Normal mood and affect  Laboratory Data: Results for orders placed or performed in visit on 05/01/23  Microscopic Examination   Collection Time: 05/01/23  1:56 PM   Urine  Result Value Ref Range   WBC, UA 6-10 (A) 0 - 5 /hpf   RBC, Urine 0-2 0 - 2 /hpf   Epithelial Cells (non renal) 0-10 0 - 10 /hpf   Bacteria, UA Few None seen/Few  Urinalysis, Complete   Collection Time: 05/01/23  1:56 PM  Result Value Ref Range   Specific Gravity, UA 1.010 1.005 - 1.030   pH, UA 6.5 5.0 - 7.5   Color, UA Yellow Yellow   Appearance Ur Clear Clear   Leukocytes,UA Trace (A) Negative   Protein,UA Negative Negative/Trace   Glucose, UA 3+ (A) Negative   Ketones, UA Negative Negative   RBC, UA Negative Negative   Bilirubin, UA Negative Negative   Urobilinogen, Ur 0.2 0.2 - 1.0 mg/dL   Nitrite, UA Negative Negative   Microscopic Examination See below:   Bladder Scan (Post Void Residual) in office   Collection Time: 05/01/23   2:31 PM  Result Value Ref Range   Scan Result 145     Pertinent Imaging: Results for orders placed during the hospital encounter of 04/30/23  Abdomen 1 view (KUB)  Narrative CLINICAL DATA:  Kidney stone.  Patient reports right flank pain.  EXAM: ABDOMEN - 1 VIEW  COMPARISON:  CT 02/28/2022  FINDINGS: No visualized radiopaque calculi. Calcifications in the pelvis correspond with phleboliths on prior CT. Moderate volume of stool in the colon. No bowel dilatation or evidence of obstruction. Right upper quadrant cholecystectomy clips.  IMPRESSION: 1. No visualized radiopaque calculi. 2. Moderate colonic stool burden.   Electronically Signed By: Chadwick Colonel M.D. On: 04/30/2023 17:28  I personally reviewed the images referenced above and note no radiopaque urolithiasis.  Assessment & Plan:  1. Urinary frequency (Primary) UA today notable only for mild pyuria, overall low suspicion for acute stone episode with negative CT scan last year, negative KUB yesterday, and no microscopic hematuria.  Will treat her with empiric cefuroxime and send for culture for further evaluation.  I will call her with her culture results and if she is still having symptoms despite antibiotics, may consider additional imaging.  She is in agreement with this plan.  I am also sending in Pyridium to help with her intermittent dysuria. - Urinalysis, Complete - Bladder Scan (Post Void Residual) in office - CULTURE, URINE COMPREHENSIVE - cefUROXime (CEFTIN) 250 MG tablet; Take 1 tablet (250 mg total) by mouth 2 (two) times daily with a meal for 5 days.  Dispense: 10 tablet; Refill: 0 - phenazopyridine (PYRIDIUM) 200 MG tablet; Take 1 tablet (200 mg total) by mouth 3 (three) times daily as needed for pain.  Dispense: 10 tablet; Refill: 0   Return for Will call with results.  Kathreen Pare, PA-C  [facility] [place] [address], [address] [place], [place] [address] [phone]

## 2023-05-02 ENCOUNTER — Telehealth: Payer: Self-pay | Admitting: Neurosurgery

## 2023-05-02 DIAGNOSIS — M533 Sacrococcygeal disorders, not elsewhere classified: Secondary | ICD-10-CM

## 2023-05-02 NOTE — Telephone Encounter (Signed)
 Patient is calling that she had a recent SI joint injection. She had complete relief. She is calling back to see what the next step is for her to get the referral to [facility] for the SI infusion.

## 2023-05-02 NOTE — Telephone Encounter (Signed)
 SI joint injection helped a lot, ok to refer her to have SI fusion per your last note? Who should we send her to?

## 2023-05-03 ENCOUNTER — Telehealth: Payer: Self-pay | Admitting: Physician Assistant

## 2023-05-03 DIAGNOSIS — B3731 Acute candidiasis of vulva and vagina: Secondary | ICD-10-CM

## 2023-05-03 MED ORDER — FLUCONAZOLE 150 MG PO TABS
150.0000 mg | ORAL_TABLET | Freq: Once | ORAL | 0 refills | Status: AC
Start: 2023-05-03 — End: 2023-05-03

## 2023-05-03 NOTE — Telephone Encounter (Signed)
 LVM for pt to return call to get more information on her symptoms

## 2023-05-03 NOTE — Addendum Note (Signed)
 Addended by: Anajah Sterbenz P on: 05/03/2023 10:09 AM   Modules accepted: Orders

## 2023-05-03 NOTE — Telephone Encounter (Signed)
 [facility] [address]  If her oral symptoms do not improve, I want her to call us  back so we can send her in some Magic mouthwash as well.

## 2023-05-03 NOTE — Telephone Encounter (Signed)
 Patient notified via mychart

## 2023-05-03 NOTE — Telephone Encounter (Signed)
 Patient called stating that she believes she has a yeast infection from the antibiotic Cefuroxime  that was prescribed on her last visit on 04/3023. Patient would like to know if another RX can be sent to pharmacy to treat this infection. Please advise patient.

## 2023-05-03 NOTE — Telephone Encounter (Signed)
Referral has been placed, please send.

## 2023-05-03 NOTE — Telephone Encounter (Signed)
 Pt informed, voiced understanding

## 2023-05-04 LAB — CULTURE, URINE COMPREHENSIVE

## 2023-05-06 NOTE — Telephone Encounter (Signed)
 Patient spoke with [facility] office and they will not be able to do a consultation until August.  Patient would like to know the name of the [facility] provider that was mentioned to her

## 2023-05-07 NOTE — Telephone Encounter (Signed)
 Patient notified referral has been changed to [facility] in [place] [employer].

## 2023-05-07 NOTE — Addendum Note (Signed)
 Addended by: Anise Kerns on: 05/07/2023 02:46 PM   Modules accepted: Orders

## 2023-05-14 NOTE — Telephone Encounter (Signed)
This was handled in another message

## 2023-07-31 ENCOUNTER — Other Ambulatory Visit: Payer: Self-pay | Admitting: Internal Medicine

## 2023-07-31 DIAGNOSIS — Z1231 Encounter for screening mammogram for malignant neoplasm of breast: Secondary | ICD-10-CM

## 2023-08-16 ENCOUNTER — Ambulatory Visit
Admission: RE | Admit: 2023-08-16 | Discharge: 2023-08-16 | Disposition: A | Discharge status: Home / Self Care | Source: Ambulatory Visit | Attending: Internal Medicine | Admitting: Internal Medicine

## 2023-08-16 DIAGNOSIS — Z1231 Encounter for screening mammogram for malignant neoplasm of breast: Secondary | ICD-10-CM | POA: Insufficient documentation

## 2023-10-06 ENCOUNTER — Emergency Department

## 2023-10-06 ENCOUNTER — Emergency Department
Admission: EM | Admit: 2023-10-06 | Discharge: 2023-10-06 | Disposition: A | Discharge status: Home / Self Care | Attending: Emergency Medicine | Admitting: Emergency Medicine

## 2023-10-06 ENCOUNTER — Other Ambulatory Visit: Payer: Self-pay

## 2023-10-06 DIAGNOSIS — E119 Type 2 diabetes mellitus without complications: Secondary | ICD-10-CM | POA: Insufficient documentation

## 2023-10-06 DIAGNOSIS — M25552 Pain in left hip: Secondary | ICD-10-CM | POA: Insufficient documentation

## 2023-10-06 MED ORDER — DEXAMETHASONE SODIUM PHOSPHATE 10 MG/ML IJ SOLN
10.0000 mg | Freq: Once | INTRAMUSCULAR | Status: AC
  Administered 2023-10-06: 10 mg via INTRAMUSCULAR
  Filled 2023-10-06: qty 1

## 2023-10-06 MED ORDER — HYDROCODONE-ACETAMINOPHEN 5-325 MG PO TABS
1.0000 | ORAL_TABLET | Freq: Once | ORAL | Status: AC
  Administered 2023-10-06: 1 via ORAL
  Filled 2023-10-06: qty 1

## 2023-10-06 MED ORDER — HYDROCODONE-ACETAMINOPHEN 5-325 MG PO TABS: 1.0000 | ORAL_TABLET | Freq: Four times a day (QID) | ORAL | 0 refills | Status: AC | PRN

## 2023-10-06 MED ORDER — PREDNISONE 10 MG PO TABS: ORAL_TABLET | ORAL | 0 refills | Status: AC

## 2023-10-06 NOTE — ED Notes (Signed)
 See triage note  Presents with left hip pain  States pain started about 2-3 weeks ago w/o injury  Has been using topical meds and OTC meds w/o relief

## 2023-10-06 NOTE — ED Provider Notes (Signed)
 [facility] Provider Note    None    (approximate)   History   Hip Pain   HPI  Martha Whitney is a 58 y.o. female presents to the ED with complaint of left hip pain.  Patient states that she had surgery on her right hip and has been favoring her left hip for the last several weeks but pain has become worse in the past week.  Patient denies any recent falls or injuries to her left hip.  Patient infrequently takes meloxicam  but has not taken it recently.  Patient has a history of arthritis, GERD, diabetes, RLS.     Physical Exam   Triage Vital Signs: ED Triage Vitals  Encounter Vitals Group     BP 10/06/23 0938 100/68     Girls Systolic BP Percentile --      Girls Diastolic BP Percentile --      Boys Systolic BP Percentile --      Boys Diastolic BP Percentile --      Pulse Rate 10/06/23 0938 95     Resp 10/06/23 0938 17     Temp 10/06/23 0938 98 F (36.7 C)     Temp src --      SpO2 10/06/23 0938 100 %     Weight 10/06/23 0937 130 lb (59 kg)     Height 10/06/23 0937 5' 3 (1.6 m)     Head Circumference --      Peak Flow --      Pain Score 10/06/23 0937 8     Pain Loc --      Pain Education --      Exclude from Growth Chart --     Most recent vital signs: Vitals:   10/06/23 0938  BP: 100/68  Pulse: 95  Resp: 17  Temp: 98 F (36.7 C)  SpO2: 100%     General: Awake, no distress.  CV:  Good peripheral perfusion.  Resp:  Normal effort.  Abd:  No distention.  Other:  Left hip with diffuse tenderness noted anterior, lateral and posterior area.  No shortening or rotation was noted.  No erythema or discoloration.  Range of motion with discomfort.   ED Results / Procedures / Treatments   Labs (all labs ordered are listed, but only abnormal results are displayed) Labs Reviewed - No data to display   RADIOLOGY Left hip x-ray images reviewed interpreted by myself independent of the radiologist and was negative for fracture or  dislocation.  Radiology report is negative for acute changes.    PROCEDURES:  Critical Care performed:   Procedures   MEDICATIONS ORDERED IN ED: Medications  dexamethasone  (DECADRON ) injection 10 mg (10 mg Intramuscular Given 10/06/23 1055)  HYDROcodone -acetaminophen  (NORCO/VICODIN) 5-325 MG per tablet 1 tablet (1 tablet Oral Given 10/06/23 1055)     IMPRESSION / MDM / ASSESSMENT AND PLAN / ED COURSE  I reviewed the triage vital signs and the nursing notes.   Differential diagnosis includes, but is not limited to, left hip fracture, dislocation, osteoarthritis, avascular necrosis, bursitis, tendinitis.  58 year old female presents to the ED with complaint of left hip pain without history of injury.  Patient states that she had surgery on her right hip and has been favoring that causing pain to her left hip which has gotten worse over the past week.  X-rays of the left hip were reassuring and patient was made aware.  An injection of Decadron  10 mg IM and hydrocodone  was given  p.o. while in the ED.  A prescription for hydrocodone  and a 5-day course of prednisone  was sent to the pharmacy.  Patient is to follow-up with her PCP or Dr. Lorelle who is on-call for orthopedics if any continued problems or not improving.      Patient's presentation is most consistent with acute illness / injury with system symptoms.  FINAL CLINICAL IMPRESSION(S) / ED DIAGNOSES   Final diagnoses:  Left hip pain     Rx / DC Orders   ED Discharge Orders          Ordered    predniSONE  (DELTASONE ) 10 MG tablet        10/06/23 1037    HYDROcodone -acetaminophen  (NORCO/VICODIN) 5-325 MG tablet  Every 6 hours PRN        10/06/23 1037             Note:  This document was prepared using Dragon voice recognition software and may include unintentional dictation errors.   Saunders Shona CROME, PA-C 10/06/23 1127    Willo Dunnings, MD 10/07/23 [phone]

## 2023-10-06 NOTE — Discharge Instructions (Signed)
 Follow-up with your primary care provider or Dr.Aberman who is on-call for orthopedics.  His contact information and address are listed on your discharge papers and he is also in [facility].  A prescription for prednisone  was sent to the pharmacy to take 3 tablets once a day for the next 5 days and hydrocodone  as needed for moderate to severe pain.  Be aware that the hydrocodone  is a narcotic and cause drowsiness which will increase your risk for falling.  Ice or heat to your hip as needed for discomfort.

## 2023-10-06 NOTE — ED Notes (Signed)
 Patient transported to X-ray

## 2023-10-06 NOTE — ED Triage Notes (Signed)
 Pt comes with left hip pain for couple weeks. Pt state worse this week. Pt states she had surgery on her right one and had been favoring her left hip. Pt states it feels like it is bone on bone. Pt denies any injuries.
